# Patient Record
Sex: Female | Born: 1960 | Race: White | Hispanic: No | Marital: Single | Smoking: Never smoker
Health system: Southern US, Community
[De-identification: ages and names within clinical notes are randomized; demographics above are authoritative.]

## PROBLEM LIST (undated history)

## (undated) DIAGNOSIS — D649 Anemia, unspecified: Secondary | ICD-10-CM

## (undated) DIAGNOSIS — T7840XA Allergy, unspecified, initial encounter: Secondary | ICD-10-CM

## (undated) DIAGNOSIS — G43909 Migraine, unspecified, not intractable, without status migrainosus: Secondary | ICD-10-CM

## (undated) DIAGNOSIS — M1990 Unspecified osteoarthritis, unspecified site: Secondary | ICD-10-CM

## (undated) DIAGNOSIS — M25552 Pain in left hip: Secondary | ICD-10-CM

## (undated) DIAGNOSIS — K219 Gastro-esophageal reflux disease without esophagitis: Secondary | ICD-10-CM

## (undated) DIAGNOSIS — B029 Zoster without complications: Secondary | ICD-10-CM

## (undated) DIAGNOSIS — I4891 Unspecified atrial fibrillation: Secondary | ICD-10-CM

## (undated) DIAGNOSIS — T754XXA Electrocution, initial encounter: Secondary | ICD-10-CM

## (undated) DIAGNOSIS — F419 Anxiety disorder, unspecified: Secondary | ICD-10-CM

## (undated) HISTORY — DX: Pain in left hip: M25.552

## (undated) HISTORY — DX: Gastro-esophageal reflux disease without esophagitis: K21.9

## (undated) HISTORY — DX: Anxiety disorder, unspecified: F41.9

## (undated) HISTORY — DX: Unspecified osteoarthritis, unspecified site: M19.90

## (undated) HISTORY — DX: Allergy, unspecified, initial encounter: T78.40XA

## (undated) HISTORY — DX: Electrocution, initial encounter: T75.4XXA

## (undated) HISTORY — DX: Anemia, unspecified: D64.9

---

## 1993-10-25 HISTORY — PX: OVARIAN CYST REMOVAL: SHX89

## 1998-01-23 ENCOUNTER — Other Ambulatory Visit: Admission: RE | Admit: 1998-01-23 | Discharge: 1998-01-23 | Payer: Self-pay | Admitting: Obstetrics and Gynecology

## 1999-02-20 ENCOUNTER — Other Ambulatory Visit: Admission: RE | Admit: 1999-02-20 | Discharge: 1999-02-20 | Payer: Self-pay | Admitting: Obstetrics and Gynecology

## 1999-03-09 ENCOUNTER — Ambulatory Visit (HOSPITAL_COMMUNITY): Admission: RE | Admit: 1999-03-09 | Discharge: 1999-03-09 | Payer: Self-pay | Admitting: Obstetrics and Gynecology

## 1999-05-13 ENCOUNTER — Ambulatory Visit (HOSPITAL_COMMUNITY): Admission: RE | Admit: 1999-05-13 | Discharge: 1999-05-13 | Payer: Self-pay | Admitting: Obstetrics and Gynecology

## 1999-05-13 ENCOUNTER — Encounter: Payer: Self-pay | Admitting: Obstetrics and Gynecology

## 2000-02-22 ENCOUNTER — Other Ambulatory Visit: Admission: RE | Admit: 2000-02-22 | Discharge: 2000-02-22 | Payer: Self-pay | Admitting: Obstetrics and Gynecology

## 2001-04-05 ENCOUNTER — Other Ambulatory Visit: Admission: RE | Admit: 2001-04-05 | Discharge: 2001-04-05 | Payer: Self-pay | Admitting: Obstetrics and Gynecology

## 2002-04-06 ENCOUNTER — Other Ambulatory Visit: Admission: RE | Admit: 2002-04-06 | Discharge: 2002-04-06 | Payer: Self-pay | Admitting: Obstetrics and Gynecology

## 2002-06-01 ENCOUNTER — Encounter: Payer: Self-pay | Admitting: Obstetrics and Gynecology

## 2002-06-01 ENCOUNTER — Ambulatory Visit (HOSPITAL_COMMUNITY): Admission: RE | Admit: 2002-06-01 | Discharge: 2002-06-01 | Payer: Self-pay

## 2003-04-25 ENCOUNTER — Other Ambulatory Visit: Admission: RE | Admit: 2003-04-25 | Discharge: 2003-04-25 | Payer: Self-pay | Admitting: Obstetrics and Gynecology

## 2003-10-01 ENCOUNTER — Encounter: Payer: Self-pay | Admitting: Internal Medicine

## 2004-05-18 ENCOUNTER — Other Ambulatory Visit: Admission: RE | Admit: 2004-05-18 | Discharge: 2004-05-18 | Payer: Self-pay | Admitting: Obstetrics and Gynecology

## 2004-05-21 ENCOUNTER — Encounter: Admission: RE | Admit: 2004-05-21 | Discharge: 2004-05-21 | Payer: Self-pay | Admitting: Internal Medicine

## 2005-06-28 ENCOUNTER — Observation Stay (HOSPITAL_COMMUNITY): Admission: EM | Admit: 2005-06-28 | Discharge: 2005-06-29 | Payer: Self-pay

## 2005-07-13 ENCOUNTER — Ambulatory Visit: Payer: Self-pay | Admitting: Internal Medicine

## 2005-07-23 ENCOUNTER — Ambulatory Visit: Payer: Self-pay

## 2005-07-23 ENCOUNTER — Encounter: Payer: Self-pay | Admitting: Internal Medicine

## 2006-03-02 ENCOUNTER — Ambulatory Visit: Payer: Self-pay | Admitting: Cardiology

## 2006-03-02 ENCOUNTER — Ambulatory Visit: Payer: Self-pay | Admitting: Internal Medicine

## 2006-03-04 ENCOUNTER — Encounter: Admission: RE | Admit: 2006-03-04 | Discharge: 2006-03-04 | Payer: Self-pay | Admitting: Cardiology

## 2006-03-07 ENCOUNTER — Ambulatory Visit: Payer: Self-pay | Admitting: Internal Medicine

## 2006-03-24 ENCOUNTER — Ambulatory Visit: Payer: Self-pay | Admitting: Internal Medicine

## 2006-04-22 ENCOUNTER — Ambulatory Visit: Payer: Self-pay | Admitting: Internal Medicine

## 2006-05-04 ENCOUNTER — Ambulatory Visit: Payer: Self-pay | Admitting: Internal Medicine

## 2006-05-13 ENCOUNTER — Ambulatory Visit (HOSPITAL_COMMUNITY): Admission: RE | Admit: 2006-05-13 | Discharge: 2006-05-13 | Payer: Self-pay | Admitting: Obstetrics and Gynecology

## 2006-11-04 ENCOUNTER — Ambulatory Visit: Payer: Self-pay | Admitting: Internal Medicine

## 2006-11-04 LAB — CONVERTED CEMR LAB
Basophils Absolute: 0.2 10*3/uL — ABNORMAL HIGH (ref 0.0–0.1)
Basophils Relative: 2.9 % — ABNORMAL HIGH (ref 0.0–1.0)
Eosinophil percent: 2.7 % (ref 0.0–5.0)
Ferritin: 14.3 ng/mL (ref 10.0–291.0)
HCT: 39.7 % (ref 36.0–46.0)
Hemoglobin: 13 g/dL (ref 12.0–15.0)
Iron: 129 ug/dL (ref 42–145)
Lymphocytes Relative: 16.1 % (ref 12.0–46.0)
MCHC: 32.8 g/dL (ref 30.0–36.0)
MCV: 94.5 fL (ref 78.0–100.0)
Monocytes Absolute: 0.4 10*3/uL (ref 0.2–0.7)
Monocytes Relative: 6.3 % (ref 3.0–11.0)
Neutro Abs: 5.1 10*3/uL (ref 1.4–7.7)
Neutrophils Relative %: 72 % (ref 43.0–77.0)
Platelets: 287 10*3/uL (ref 150–400)
RBC: 4.2 M/uL (ref 3.87–5.11)
RDW: 12.7 % (ref 11.5–14.6)
Saturation Ratios: 30.8 % (ref 20.0–50.0)
Transferrin: 299.2 mg/dL (ref >=212.0)
WBC: 7 10*3/uL (ref 4.5–10.5)

## 2007-01-06 ENCOUNTER — Ambulatory Visit: Payer: Self-pay | Admitting: Internal Medicine

## 2007-10-04 ENCOUNTER — Ambulatory Visit: Payer: Self-pay | Admitting: Internal Medicine

## 2007-10-05 ENCOUNTER — Telehealth: Payer: Self-pay | Admitting: Internal Medicine

## 2008-01-02 ENCOUNTER — Ambulatory Visit (HOSPITAL_COMMUNITY): Admission: RE | Admit: 2008-01-02 | Discharge: 2008-01-02 | Payer: Self-pay | Admitting: Obstetrics and Gynecology

## 2008-01-31 ENCOUNTER — Telehealth: Payer: Self-pay | Admitting: Internal Medicine

## 2008-02-19 ENCOUNTER — Ambulatory Visit: Payer: Self-pay | Admitting: Internal Medicine

## 2008-02-19 ENCOUNTER — Encounter (INDEPENDENT_AMBULATORY_CARE_PROVIDER_SITE_OTHER): Payer: Self-pay | Admitting: Family Medicine

## 2008-02-19 ENCOUNTER — Ambulatory Visit: Payer: Self-pay | Admitting: *Deleted

## 2008-02-19 LAB — CONVERTED CEMR LAB
ALT: 16 units/L (ref 0–35)
Basophils Absolute: 0 10*3/uL (ref 0.0–0.1)
Chloride: 103 meq/L (ref 96–112)
Cholesterol: 160 mg/dL (ref 0–200)
Eosinophils Absolute: 0.4 10*3/uL (ref 0.0–0.7)
Eosinophils Relative: 4 % (ref 0–5)
Glucose, Bld: 86 mg/dL (ref 70–99)
HCT: 38.8 % (ref 36.0–46.0)
HDL: 63 mg/dL (ref >39)
Hemoglobin: 12.8 g/dL (ref 12.0–15.0)
LDL Cholesterol: 74 mg/dL (ref 0–99)
MCHC: 33 g/dL (ref 30.0–36.0)
MCV: 93.5 fL (ref 78.0–100.0)
Neutro Abs: 6.3 10*3/uL (ref 1.7–7.7)
Neutrophils Relative %: 69 % (ref 43–77)
Potassium: 4.9 meq/L (ref 3.5–5.3)
RBC: 4.15 M/uL (ref 3.87–5.11)
Sodium: 138 meq/L (ref 135–145)
Total CHOL/HDL Ratio: 2.5
Total Protein: 7.2 g/dL (ref 6.0–8.3)
Triglycerides: 114 mg/dL (ref <150)
VLDL: 23 mg/dL (ref 0–40)

## 2008-04-12 ENCOUNTER — Encounter: Payer: Self-pay | Admitting: Family Medicine

## 2008-04-12 ENCOUNTER — Ambulatory Visit: Payer: Self-pay | Admitting: Internal Medicine

## 2008-04-12 LAB — CONVERTED CEMR LAB
Chlamydia, DNA Probe: NEGATIVE
Hep B Core Total Ab: NEGATIVE
Hep B E Ab: NEGATIVE

## 2008-04-25 ENCOUNTER — Ambulatory Visit: Payer: Self-pay | Admitting: Internal Medicine

## 2008-11-12 ENCOUNTER — Ambulatory Visit: Payer: Self-pay | Admitting: Internal Medicine

## 2009-02-04 ENCOUNTER — Telehealth: Payer: Self-pay | Admitting: Internal Medicine

## 2009-08-01 ENCOUNTER — Ambulatory Visit: Payer: Self-pay | Admitting: Family Medicine

## 2009-08-07 ENCOUNTER — Ambulatory Visit (HOSPITAL_COMMUNITY): Admission: RE | Admit: 2009-08-07 | Discharge: 2009-08-07 | Payer: Self-pay | Admitting: Family Medicine

## 2009-10-23 ENCOUNTER — Ambulatory Visit: Payer: Self-pay | Admitting: Family Medicine

## 2009-12-16 ENCOUNTER — Telehealth: Payer: Self-pay | Admitting: Internal Medicine

## 2010-02-02 ENCOUNTER — Ambulatory Visit: Payer: Self-pay | Admitting: Internal Medicine

## 2010-02-02 DIAGNOSIS — F411 Generalized anxiety disorder: Secondary | ICD-10-CM | POA: Insufficient documentation

## 2010-04-23 ENCOUNTER — Encounter (INDEPENDENT_AMBULATORY_CARE_PROVIDER_SITE_OTHER): Payer: Self-pay | Admitting: Family Medicine

## 2010-04-23 ENCOUNTER — Ambulatory Visit: Payer: Self-pay | Admitting: Internal Medicine

## 2010-04-23 LAB — CONVERTED CEMR LAB
HDL: 75 mg/dL (ref >39)
Total CHOL/HDL Ratio: 2.2
Triglycerides: 111 mg/dL (ref <150)
Vit D, 25-Hydroxy: 57 ng/mL (ref 30–89)

## 2010-05-01 ENCOUNTER — Ambulatory Visit: Payer: Self-pay | Admitting: Internal Medicine

## 2010-05-01 ENCOUNTER — Telehealth: Payer: Self-pay | Admitting: Internal Medicine

## 2010-05-01 LAB — CONVERTED CEMR LAB
ALT: 21 units/L (ref 0–35)
AST: 21 units/L (ref 0–37)
Albumin: 3.6 g/dL (ref 3.5–5.2)
Alkaline Phosphatase: 36 units/L — ABNORMAL LOW (ref 39–117)
Basophils Absolute: 0 10*3/uL (ref 0.0–0.1)
Bilirubin, Direct: 0.1 mg/dL (ref 0.0–0.3)
Calcium: 9.1 mg/dL (ref 8.4–10.5)
Chloride: 110 meq/L (ref 96–112)
Cholesterol: 171 mg/dL (ref 0–200)
Eosinophils Absolute: 0.4 10*3/uL (ref 0.0–0.7)
Eosinophils Relative: 5.3 % — ABNORMAL HIGH (ref 0.0–5.0)
GFR calc non Af Amer: 87.59 mL/min (ref >60)
Hemoglobin: 12.1 g/dL (ref 12.0–15.0)
LDL Cholesterol: 88 mg/dL (ref 0–99)
Lymphocytes Relative: 17.5 % (ref 12.0–46.0)
Lymphs Abs: 1.4 10*3/uL (ref 0.7–4.0)
MCV: 93.5 fL (ref 78.0–100.0)
Monocytes Relative: 6.2 % (ref 3.0–12.0)
Neutro Abs: 5.7 10*3/uL (ref 1.4–7.7)
Neutrophils Relative %: 70.6 % (ref 43.0–77.0)
Nitrite: NEGATIVE
Platelets: 269 10*3/uL (ref 150.0–400.0)
Protein, U semiquant: NEGATIVE
Sodium: 143 meq/L (ref 135–145)
Total Bilirubin: 0.5 mg/dL (ref 0.3–1.2)
Total Protein: 7 g/dL (ref 6.0–8.3)
WBC: 8.1 10*3/uL (ref 4.5–10.5)
pH: 5

## 2010-05-14 ENCOUNTER — Ambulatory Visit: Payer: Self-pay | Admitting: Internal Medicine

## 2010-07-09 ENCOUNTER — Ambulatory Visit: Payer: Self-pay | Admitting: Internal Medicine

## 2010-07-13 ENCOUNTER — Telehealth: Payer: Self-pay | Admitting: Internal Medicine

## 2010-07-15 ENCOUNTER — Telehealth: Payer: Self-pay | Admitting: Internal Medicine

## 2010-07-23 ENCOUNTER — Ambulatory Visit: Payer: Self-pay | Admitting: Internal Medicine

## 2010-07-23 LAB — CONVERTED CEMR LAB
BUN: 11 mg/dL (ref 6–23)
Basophils Absolute: 0 10*3/uL (ref 0.0–0.1)
Basophils Relative: 0.2 % (ref 0.0–3.0)
Chloride: 106 meq/L (ref 96–112)
Eosinophils Absolute: 0.2 10*3/uL (ref 0.0–0.7)
GFR calc non Af Amer: 80.07 mL/min (ref >60)
Glucose, Bld: 79 mg/dL (ref 70–99)
Hemoglobin: 12.7 g/dL (ref 12.0–15.0)
Neutro Abs: 7.6 10*3/uL (ref 1.4–7.7)
Potassium: 5.1 meq/L (ref 3.5–5.1)
Sodium: 139 meq/L (ref 135–145)
WBC: 9.7 10*3/uL (ref 4.5–10.5)

## 2010-07-24 ENCOUNTER — Telehealth: Payer: Self-pay | Admitting: Internal Medicine

## 2010-07-24 ENCOUNTER — Encounter: Payer: Self-pay | Admitting: Internal Medicine

## 2010-07-27 ENCOUNTER — Telehealth: Payer: Self-pay | Admitting: Internal Medicine

## 2010-08-10 ENCOUNTER — Telehealth (INDEPENDENT_AMBULATORY_CARE_PROVIDER_SITE_OTHER): Payer: Self-pay | Admitting: *Deleted

## 2010-08-10 ENCOUNTER — Ambulatory Visit: Payer: Self-pay | Admitting: Family Medicine

## 2010-08-10 ENCOUNTER — Telehealth: Payer: Self-pay | Admitting: Internal Medicine

## 2010-08-10 DIAGNOSIS — K219 Gastro-esophageal reflux disease without esophagitis: Secondary | ICD-10-CM | POA: Insufficient documentation

## 2010-08-10 LAB — CONVERTED CEMR LAB
Beta hcg, urine, semiquantitative: NEGATIVE
Bilirubin Urine: NEGATIVE
Glucose, Urine, Semiquant: NEGATIVE
Nitrite: NEGATIVE
WBC Urine, dipstick: NEGATIVE
pH: 5.5

## 2010-08-11 LAB — CONVERTED CEMR LAB
Basophils Relative: 0.7 % (ref 0.0–3.0)
Eosinophils Absolute: 0.2 10*3/uL (ref 0.0–0.7)
HCT: 39.2 % (ref 36.0–46.0)
Hemoglobin: 13.2 g/dL (ref 12.0–15.0)
Lymphocytes Relative: 20.2 % (ref 12.0–46.0)
MCHC: 33.6 g/dL (ref 30.0–36.0)
MCV: 93.3 fL (ref 78.0–100.0)
Monocytes Relative: 6.8 % (ref 3.0–12.0)
Neutro Abs: 5.5 10*3/uL (ref 1.4–7.7)
Neutrophils Relative %: 69.8 % (ref 43.0–77.0)
Platelets: 280 10*3/uL (ref 150.0–400.0)
RBC: 4.21 M/uL (ref 3.87–5.11)
RDW: 12.7 % (ref 11.5–14.6)
WBC: 7.9 10*3/uL (ref 4.5–10.5)

## 2010-08-13 ENCOUNTER — Telehealth: Payer: Self-pay | Admitting: Family Medicine

## 2010-08-13 ENCOUNTER — Telehealth: Payer: Self-pay | Admitting: Internal Medicine

## 2010-08-18 ENCOUNTER — Ambulatory Visit: Payer: Self-pay | Admitting: Internal Medicine

## 2010-08-18 DIAGNOSIS — M549 Dorsalgia, unspecified: Secondary | ICD-10-CM | POA: Insufficient documentation

## 2010-08-19 ENCOUNTER — Telehealth: Payer: Self-pay | Admitting: Internal Medicine

## 2010-08-31 ENCOUNTER — Telehealth: Payer: Self-pay | Admitting: Internal Medicine

## 2010-08-31 DIAGNOSIS — R109 Unspecified abdominal pain: Secondary | ICD-10-CM | POA: Insufficient documentation

## 2010-09-02 ENCOUNTER — Ambulatory Visit (HOSPITAL_COMMUNITY)
Admission: RE | Admit: 2010-09-02 | Discharge: 2010-09-02 | Payer: Self-pay | Source: Home / Self Care | Admitting: Internal Medicine

## 2010-09-03 ENCOUNTER — Telehealth: Payer: Self-pay | Admitting: Internal Medicine

## 2010-09-03 ENCOUNTER — Encounter: Payer: Self-pay | Admitting: Internal Medicine

## 2010-09-03 ENCOUNTER — Ambulatory Visit (HOSPITAL_COMMUNITY)
Admission: RE | Admit: 2010-09-03 | Discharge: 2010-09-03 | Payer: Self-pay | Source: Home / Self Care | Admitting: Obstetrics and Gynecology

## 2010-09-04 ENCOUNTER — Telehealth: Payer: Self-pay | Admitting: Internal Medicine

## 2010-09-07 ENCOUNTER — Encounter: Payer: Self-pay | Admitting: Internal Medicine

## 2010-09-07 ENCOUNTER — Telehealth: Payer: Self-pay | Admitting: Internal Medicine

## 2010-09-08 ENCOUNTER — Ambulatory Visit: Payer: Self-pay | Admitting: Gastroenterology

## 2010-09-08 DIAGNOSIS — N83209 Unspecified ovarian cyst, unspecified side: Secondary | ICD-10-CM | POA: Insufficient documentation

## 2010-09-11 ENCOUNTER — Telehealth: Payer: Self-pay | Admitting: Internal Medicine

## 2010-10-13 ENCOUNTER — Ambulatory Visit: Payer: Self-pay | Admitting: Gastroenterology

## 2010-10-25 DIAGNOSIS — M25552 Pain in left hip: Secondary | ICD-10-CM

## 2010-10-25 HISTORY — DX: Pain in left hip: M25.552

## 2010-11-13 ENCOUNTER — Ambulatory Visit (HOSPITAL_COMMUNITY)
Admission: RE | Admit: 2010-11-13 | Discharge: 2010-11-13 | Payer: Self-pay | Source: Home / Self Care | Attending: Obstetrics and Gynecology | Admitting: Obstetrics and Gynecology

## 2010-11-26 NOTE — Progress Notes (Signed)
Summary: Appt sooner than 1st available  Phone Note From Other Clinic   Caller: Aurther Loft @ Dr Cato Mulligan [phone] Call For: Dr Marina Goodell Reason for Call: Schedule Patient Appt Summary of Call: New patient scheduled for first available 10-20-10. Abd Pain. Dr Cato Mulligan wonders if she can be seen sooner - PA would be ok. Initial call taken by: Leanor Kail [facility],  September 07, 2010 10:30 AM  Follow-up for Phone Call        [place] ntfd. that pt. given appt. for tomorrow with N.P. and Dr.Jacobs will become her Dr. She will confirm appt. with pt. and call back.  Teryl Lucy RN  September 07, 2010 11:03 AM Pt. kept appt. . Follow-up by: Teryl Lucy RN,  September 08, 2010 11:03 AM

## 2010-11-26 NOTE — Progress Notes (Signed)
Summary: Call A Nurse  Phone Note Call from Patient   Summary of Call: Pt given Dr Rise Patience recommendations of normal ur culture and she will finish her Cipro and Flagyl.  Will call if no better. Initial call taken by: Lynann Beaver CMA,  August 13, 2010 11:10 AM  Follow-up for Phone Call       Follow-up by: Lynann Beaver CMA,  August 13, 2010 11:09 AM     Call-A-Nurse Triage Call Report Triage Record Num: [id number] Operator: Remonia Richter Patient Name: Corina Stacy Call Date & Time: 08/12/2010 9:51:11PM Patient Phone: [phone] PCP: Valetta Mole. [facility] Patient Gender: Female PCP Fax : [phone] Patient DOB: Aug 27, 1961 Practice Name: [facility] Reason for Call: put on Cipro and Flagyl for possible e-Coli due to fever and diarrhea with pain in stomach, stools negative, has had s/s x 2 weeks,,now asking if urine c/s done 2 days ago has come back with results, has heartburn tonight and wondering if it can be caused by antibiotics, afebrile, all emergent s/s ruled out,home care advise given per abd pain guideline,callback perimeters gone over, will call in am for c/s results and further advise Protocol(s) Used: Abdominal Pain Recommended Outcome per Protocol: See Provider within 24 hours Reason for Outcome: Abdominal discomfort (feeling of fullness/bloating, nausea) AND has started new prescription, nonprescription or alternative medication OR change in dose or frequency of usual medication Care Advice:  ~ Eliminate or decrease the intake of alcoholic or caffeinated beverages. Do not take aspirin, ibuprofen, ketoprofen, naproxen, etc., or other pain relieving medications until consulting with provider.  ~ Call provider during regular office hours to inform of symptoms and receive instructions regarding continuing or changing medication or treatment plan.  ~  ~ SYMPTOM / CONDITION MANAGEMENT  ~ CAUTIONS 10/

## 2010-11-26 NOTE — Progress Notes (Signed)
Summary: stool samples results  Phone Note Call from Patient Call back at Home Phone [phone]   Caller: Patient Call For: Birdie Sons MD Summary of Call: pt would like other stool samples results.Pt is on flagyl feeling better. [facility] Initial call taken by: Heron Sabins,  July 27, 2010 4:05 PM  Follow-up for Phone Call        Pt informed on personally identified VM of culture results.  Fever broke over the weekend, stools returning to normal slowly. FYI  Follow-up by: Sid Falcon LPN,  July 27, 2010 4:24 PM

## 2010-11-26 NOTE — Progress Notes (Signed)
Summary: alprazolam denied  Phone Note From Pharmacy Call back at fax [phone]   Caller: [facility] via fax Call For: swords  Summary of Call: refill alprazolam 0.25mg  Take 1 tablet by mouth once a day as needed  Initial call taken by: Gladis Riffle, RN,  December 16, 2009 12:29 PM  Follow-up for Phone Call        denied as was last seen 10/04/07--needs office visit to refill--pharmacy notified via fax Follow-up by: Gladis Riffle, RN,  December 16, 2009 12:29 PM

## 2010-11-26 NOTE — Assessment & Plan Note (Signed)
Summary: flu shot//ccm  Nurse Visit   Allergies: 1)  Sulfamethoxazole (Sulfamethoxazole)  Review of Systems       Flu Vaccine Consent Questions     Do you have a history of severe allergic reactions to this vaccine? no    Any prior history of allergic reactions to egg and/or gelatin? no    Do you have a sensitivity to the preservative Thimersol? no    Do you have a past history of Guillan-Barre Syndrome? no    Do you currently have an acute febrile illness? no    Have you ever had a severe reaction to latex? no    Vaccine information given and explained to patient? yes    Are you currently pregnant? no    Lot Number:[id number]   Exp Date:04/24/2011   Site Given  Left Deltoid IM Josph Macho RMA  July 09, 2010 1:16 PM    Orders Added: 1)  Admin 1st Vaccine [90471] 2)  Flu Vaccine 40yrs + [56213]

## 2010-11-26 NOTE — Progress Notes (Signed)
  Phone Note Call from Patient   Caller: Patient Call For: Birdie Sons MD Summary of Call: GYN does not need to see her but ordered CA 125.  He feels endometriosis could be the reason for the pain.  Wants to know if she can take Ibuprofen now? [phone] Initial call taken by: [facility] CMA AAMA,  September 03, 2010 4:02 PM  Follow-up for Phone Call        yes (otc 200 mg, 2 by mouth two times a day as needed ) Follow-up by: Birdie Sons MD,  September 03, 2010 4:20 PM  Additional Follow-up for Phone Call Additional follow up Details #1::        Discussed with pt. Additional Follow-up by: Lynann Beaver CMA AAMA,  September 03, 2010 5:01 PM

## 2010-11-26 NOTE — Progress Notes (Signed)
Summary: please advise of labs and rx  Phone Note Call from Patient Call back at Home Phone [phone]   Caller: Patient--voice mail Summary of Call: was seen today and lab told her that her urine specimen eas negative. Does she need to take rx for this? She did infact start the regimen. Please advise. Initial call taken by: Warnell Forester,  August 10, 2010 1:52 PM  Follow-up for Phone Call        see the note I sent, but essentially I am going to culture the urine just for some slight findings and I do want her to take the Cipro and I added Flagyl to cover Diverticulitis like I told her I would Follow-up by: Danise Edge MD,  August 10, 2010 2:23 PM  Additional Follow-up for Phone Call Additional follow up Details #1::        Informed pt Additional Follow-up by: Josph Macho RMA,  August 10, 2010 3:03 PM

## 2010-11-26 NOTE — Assessment & Plan Note (Signed)
Summary: WATERY DIARRHEA 3 DAYS/HEARD E COLI REPORT/PS   Vital Signs:  Patient profile:   50 year old female Menstrual status:  regular Weight:      166 pounds Temp:     98.8 degrees F oral Pulse rate:   82 / minute BP sitting:   106 / 60  Vitals Entered By: Lynann Beaver CMA (July 23, 2010 9:31 AM) CC: diarrhea x 4 days Is Patient Diabetic? No Pain Assessment Patient in pain? no        CC:  diarrhea x 4 days.  History of Present Illness: diarrhea she is concerned that she has eaten the beef that has call "ecoli" she feels well today but has had bad diarrhea x 3 days  boyfriend ate same meat--not ill.  she denies any fevers or chills. Minimal abdominal pain. No blood in stool.  She has spoken with the store that sold her a hamburger. Apparently the hamburger that she has eaten was not be be that actually was painted with Escherichia coli.  review of systems she endorses fatigue, some muscle aches, minimal sweating. No other complaintsin a complete review of systems.  Current Medications (verified): 1)  Alprazolam 0.25 Mg Tabs (Alprazolam) .... Take 1 Tablet By Mouth Once A Day As Needed 2)  Triamcinolone Acetonide 0.5 % Crea (Triamcinolone Acetonide) .... As Directed As Needed 3)  Flonase 50 Mcg/act Susp (Fluticasone Propionate) .... One To Two Sprays Daily 4)  Benadryl 25 Mg Tabs (Diphenhydramine Hcl) .... As Needed Allergy 5)  Nuvaring 0.12-0.015 Mg/24hr Ring (Etonogestrel-Ethinyl Estradiol) .... Insert and Leave in For 3 Weeks, Remove For 1 Week and Repeat  Allergies (verified): 1)  Sulfamethoxazole (Sulfamethoxazole)  Past History:  Past Medical History: Last updated: 01/23/2008 ovarian cysts Anemia-NOS--secondary to menses  Past Surgical History: Last updated: 01/23/2008 laparoscopy for ovarian cysts  Family History: Last updated: 05/14/2010 Father: alive in 03-27-03 with CAD, AAA, deceased lung CA (complications of therapy) Mother: deceased MI age  74 Siblings:brothers 2 half healthy---one half sister healthy Family History of Aneurysm Aortic Family History of CAD Female 1st degree relative <60 Family History of CAD Female 1st degree relative <50  Social History: Last updated: 02/02/2010 Marital Status: single  Occupation: unemployed Regular exercise-no Never Smoked  Risk Factors: Exercise: no (01/23/2008)  Risk Factors: Smoking Status: never (05/14/2010)  Physical Exam  General:  well-developed well-nourished female in no acute distress. HEENT exam atraumatic, normocephalic symmetric her muscles are intact. No icterus noted. Neck is supple without lymphadenopathy or thyromegaly. Chest clear to auscultation without increased work of breathing. Cardiac exam S1-S2 are regular. Abdominal exam active bowel sounds, soft, nontender even to deep palpation. Extremities have no clubbing cyanosis or edema. No jaundice is identified on dermatologic exam.   Impression & Recommendations:  Problem # 1:  DIARRHEA (ICD-787.91) unclear etiology. I suspect acute viral gastroenteritis. Given history she needs further evaluation. We'll check laboratories. We'll check stool cultures. Patient understands workup. She will call me if systems worsen. Orders: Venipuncture (84166) T-Culture, Stool ([id number]) T-Stool for O&P ([id number]) T-Culture, C-Diff Toxin A/B ([id number]) Specimen Handling (25427) TLB-BMP (Basic Metabolic Panel-BMET) (80048-METABOL) TLB-Hepatic/Liver Function Pnl (80076-HEPATIC) TLB-CBC Platelet - w/Differential (85025-CBCD)  Complete Medication List: 1)  Alprazolam 0.25 Mg Tabs (Alprazolam) .... Take 1 tablet by mouth once a day as needed 2)  Triamcinolone Acetonide 0.5 % Crea (Triamcinolone acetonide) .... As directed as needed 3)  Flonase 50 Mcg/act Susp (Fluticasone propionate) .... One to two sprays daily 4)  Benadryl 25 Mg Tabs (Diphenhydramine  hcl) .... As needed allergy 5)  Nuvaring 0.12-0.015 Mg/24hr Ring  (Etonogestrel-ethinyl estradiol) .... Insert and leave in for 3 weeks, remove for 1 week and repeat 6)  Flagyl 500 Mg Tabs (Metronidazole) .... One by mouth three times a day x 7 days.  no alcohol

## 2010-11-26 NOTE — Assessment & Plan Note (Signed)
Summary: ABD PAIN/YF   History of Present Illness Visit Type: Initial Consult Primary GI MD: Rob Bunting MD Primary Kaylinn Dedic: Birdie Sons, MD  Requesting Printice Hellmer: Birdie Sons, MD  Chief Complaint: Severe abd pain, GERD, belching, bloating, constipation, and diarrhea  History of Present Illness:   Patient is a 50 year old female here for initial evaluation of recently developed gastrointestinal problems.  One and a half months ago, after eating chili, patient developed severe mid lower abdominal cramps, diarrhea with "pus", and low grade fevers. She also had an exacerbation of GERD symptoms. Thought she was ill from eating hamburger meat that was possibly infected with E.Coli. Went to see PCP, CBC, CMET okay. Stool c&s and one C-Diff negative. Fevers persisted for 10 days but her diarrhea improved. GERD symptoms improved with Zantac. Her abdominal pain has improved but she continues to have mild lower discomfort. In addition, she has developed lower back discomfort with radiation into bilateral hips. Patient has chronic back pain but this is different.  During this recent period of illness patient passed a couple of black stools. She took Pepto Bismul at some point but doesn't seem to correlate timewise to passage of black stool  Yesterday patient ate regular food and felt okay. She feels okay today  except for mild mid lower abdominal and lower back discomfort. Normal BM this am. Started Probiotic a week ago.  Patient has been under a significant amount of stress with family situations. She doesn't normally have any gastrointestinal problems except for intermittent GERD for which she takes Zantac as needed. She has hemorrhoids which bleed infrequently.        GI Review of Systems    Reports abdominal pain, acid reflux, belching, bloating, and  heartburn.     Location of  Abdominal pain: generalized.    Denies chest pain, dysphagia with liquids, dysphagia with solids, loss of appetite,  nausea, vomiting, vomiting blood, weight loss, and  weight gain.      Reports diarrhea and  diverticulosis.     Denies anal fissure, black tarry stools, change in bowel habit, constipation, fecal incontinence, heme positive stool, hemorrhoids, irritable bowel syndrome, jaundice, light color stool, liver problems, rectal bleeding, and  rectal pain.    Current Medications (verified): 1)  Alprazolam 0.25 Mg Tabs (Alprazolam) .... Take 1 Tablet By Mouth Once A Day As Needed 2)  Triamcinolone Acetonide 0.5 % Crea (Triamcinolone Acetonide) .... As Directed As Needed 3)  Flonase 50 Mcg/act Susp (Fluticasone Propionate) .... One To Two Sprays Daily 4)  Benadryl 25 Mg Tabs (Diphenhydramine Hcl) .... As Needed Allergy 5)  Nuvaring 0.12-0.015 Mg/24hr Ring (Etonogestrel-Ethinyl Estradiol) .... Insert and Leave in For 3 Weeks, Remove For 1 Week and Repeat 6)  Ranitidine Hcl 300 Mg Tabs (Ranitidine Hcl) .Marland Kitchen.. 1 Tab By Mouth Daily  Allergies (verified): 1)  Sulfamethoxazole (Sulfamethoxazole)  Past History:  Past Medical History: Ovarian cysts Anemia-NOS--secondary to menses Anxiety Disorder Depression GERD Urinary Tract Infection  Past Surgical History: laparoscopy for ovarian cysts--12 yrs ago   Family History: Father: alive in 03-09-2003 with CAD, AAA, deceased lung CA (complications of therapy) Mother: deceased MI age 26 Siblings:brothers 2 half healthy---one half sister healthy Family History of Aneurysm Aortic Family History of CAD Female 1st degree relative <60 Family History of CAD Female 1st degree relative <50 Family History of Colon Cancer:First Cousin on Paternal Side Family History of Colon Polyps:MGM  Social History: Marital Status: single  Occupation: unemployed Regular exercise-no Never Smoked Daily Caffeine Use:  one daily Illicit Drug Use - no Drug Use:  no  Review of Systems       The patient complains of anxiety-new, arthritis/joint pain, back pain, breast changes/lumps,  depression-new, muscle pains/cramps, and urine leakage.  The patient denies allergy/sinus, anemia, blood in urine, change in vision, confusion, cough, coughing up blood, fainting, fatigue, fever, headaches-new, hearing problems, heart murmur, heart rhythm changes, itching, menstrual pain, night sweats, nosebleeds, pregnancy symptoms, shortness of breath, skin rash, sleeping problems, sore throat, swelling of feet/legs, swollen lymph glands, thirst - excessive , urination - excessive , urination changes/pain, vision changes, and voice change.    Vital Signs:  Patient profile:   50 year old female Menstrual status:  regular Height:      68 inches Weight:      164 pounds BMI:     25.03 BSA:     1.88 Pulse rate:   76 / minute Pulse rhythm:   regular BP sitting:   124 / 68  (left arm) Cuff size:   regular  Vitals Entered By: Ok Anis CMA (September 08, 2010 8:46 AM)  Physical Exam  General:  Well developed, well nourished, no acute distress. Head:  Normocephalic and atraumatic. Eyes:   Conjunctiva pink, no icterus.  Neck:  no obvious masses  Lungs:  Clear throughout to auscultation. Heart:  Regular rate and rhythm; no murmurs, rubs,  or bruits. Abdomen:  Abdomen soft, nontender, nondistended. No obvious masses or hepatomegaly.Normal bowel sounds.  Rectal:  No external or internal lesions appreciated. Brown, heme negative stool Msk:  Symmetrical with no gross deformities. Normal posture. Extremities:  No palmar erythema, no edema.  Neurologic:  Alert and  oriented x4;  grossly normal neurologically. Skin:  Intact without significant lesions or rashes. Cervical Nodes:  No significant cervical adenopathy. Psych:  Alert and cooperative. Normal mood and affect.   Impression & Recommendations:  Problem # 1:  ABDOMINAL PAIN OTHER SPECIFIED SITE (ICD-789.09) Assessment Improved Residual mid lower abdominal discomfort from recent infectious type process consisting of abdominal  cramps,  diarrhea and low grade fevers. The discomfort is tolerable and will hopefully resolve as stools continue to normalize. 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 She doesn't feel the need to try Hyoscyamine. Patient looks good, her abdominal exam is benign. Return to clinic in 4-6 weeks,  if bowel habits not back to baseline and having persistent discomfort, colonoscopy will be considered.   Problem # 2:  GERD (ICD-530.81) Assessment: Comment Only Chronic intermittent GERD symptoms with recent exacerbation at onset of acute GI symptoms. Now asymptomatic on Zantac.   Problem # 3:  OTHER AND UNSPECIFIED OVARIAN CYST (ICD-620.2) Assessment: Comment Only 2.6 cm right ovary cyst seen on recent transvaginal U/S. To see GYN.  Patient Instructions: 1)  We made you an office visit with Dr. Rob Bunting for 10-13-2010 at 10:15 AM.   2)  If symptoms returnr call us and we will get you in sooner. 3)  Copy sent to : Dr. Birdie Sons

## 2010-11-26 NOTE — Assessment & Plan Note (Signed)
Summary: med check/refill/cjr   Vital Signs:  Patient profile:   50 year old female Weight:      177 pounds Temp:     98.6 degrees F oral Pulse rate:   72 / minute Pulse rhythm:   regular Resp:     12 per minute BP sitting:   110 / 64  (left arm) Cuff size:   regular  Vitals Entered By: Gladis Riffle, RN (February 02, 2010 10:10 AM) CC: medreview, refills--c/o left shoulder and arm pain--began in neck and has worked way down to lower arm, taking ibuprofen Is Patient Diabetic? No   CC:  medreview, refills--c/o left shoulder and arm pain--began in neck and has worked way down to lower arm, and taking ibuprofen.  History of Present Illness: c/o shoulder pain--- she relates to mild neck pain that seems to radiate to Left shoulder --- can radiate to upper arm  anxiety - rare use of alprazolam (no ill side effects)  Preventive Screening-Counseling & Management  Alcohol-Tobacco     Smoking Status: never  Current Medications (verified): 1)  Alprazolam 0.25 Mg Tabs (Alprazolam) .... Take 1 Tablet By Mouth Once A Day As Needed 2)  Triamcinolone Acetonide 0.5 % Crea (Triamcinolone Acetonide) .... As Directed As Needed 3)  Flonase 50 Mcg/act Susp (Fluticasone Propionate) .... One To Two Sprays Daily 4)  Benadryl 25 Mg Tabs (Diphenhydramine Hcl) .... As Needed Allergy  Allergies: 1)  Sulfamethoxazole (Sulfamethoxazole)  Social History: Marital Status: single  Occupation: unemployed Regular exercise-no Never Smoked  Physical Exam  General:  alert and well-developed.   Msk:  FROM neck  and both shoulders Neurologic:  cranial nerves II-XII intact and DTRs symmetrical and normal.   strength normal both arms   Impression & Recommendations:  Problem # 1:  NECK PAIN (ICD-723.1) I suspect radicular pain ibuprofen as needed call if sxs worsen or for any weakness  Problem # 2:  ANXIETY STATE NOS (ICD-300.00) use minimally Her updated medication list for this problem includes:  Alprazolam 0.25 Mg Tabs (Alprazolam) .Marland Kitchen... Take 1 tablet by mouth once a day as needed  Complete Medication List: 1)  Alprazolam 0.25 Mg Tabs (Alprazolam) .... Take 1 tablet by mouth once a day as needed 2)  Triamcinolone Acetonide 0.5 % Crea (Triamcinolone acetonide) .... As directed as needed 3)  Flonase 50 Mcg/act Susp (Fluticasone propionate) .... One to two sprays daily 4)  Benadryl 25 Mg Tabs (Diphenhydramine hcl) .... As needed allergy Prescriptions: ALPRAZOLAM 0.25 MG TABS (ALPRAZOLAM) Take 1 tablet by mouth once a day as needed  #20 x 1   Entered and Authorized by:   Birdie Sons MD   Signed by:   Birdie Sons MD on 02/02/2010   Method used:   Print then Give to Patient   RxID:   [id number]

## 2010-11-26 NOTE — Progress Notes (Signed)
  Phone Note Call from Patient   Caller: Patient Call For: Birdie Sons MD Summary of Call: Pt would like to rule out an ovarian cyst as she is still having pain in abdomen and back.  She needs to go to a [facility] facility, please. [phone]  Has plans tomorrow and cannot schedule it tomorrow.  Today, Wed, or Thurs.  Initial call taken by: Lynann Beaver CMA AAMA,  August 31, 2010 10:17 AM  Follow-up for Phone Call        pelvic u/s  back and pelvic pain Follow-up by: Birdie Sons MD,  August 31, 2010 11:57 AM  New Problems: PELVIC PAIN, ACUTE (ICD-789.09)   New Problems: PELVIC PAIN, ACUTE (ICD-789.09)

## 2010-11-26 NOTE — Assessment & Plan Note (Signed)
   History of Present Illness Visit Type: Follow-up Visit Primary GI MD: Rob Bunting MD Primary Provider: Birdie Sons, MD  Requesting Provider: Birdie Sons, MD  Chief Complaint: abdominal pain History of Present Illness:     very pleasant 50 year old woman who was last seen here about one month ago.at that time it was felt that she had an infectious gastroenteritis that was already starting to resolve.  in the past month, she has post prandial discomfort about once a week now (this is "much, much, much" better than it was.  No particular foods cause it. She does think stress is playing a role.  BMs will often help her symptoms.    She has lost 20 pounds in 3 months, intentionally (excercising and dieting).  She saw a small amont of blood once, rectall. Bowels are back to normal.           Current Medications (verified): 1)  Alprazolam 0.25 Mg Tabs (Alprazolam) .... Take 1 Tablet By Mouth Once A Day As Needed 2)  Triamcinolone Acetonide 0.5 % Crea (Triamcinolone Acetonide) .... As Directed As Needed 3)  Flonase 50 Mcg/act Susp (Fluticasone Propionate) .... One To Two Sprays Daily 4)  Benadryl 25 Mg Tabs (Diphenhydramine Hcl) .... As Needed Allergy 5)  Nuvaring 0.12-0.015 Mg/24hr Ring (Etonogestrel-Ethinyl Estradiol) .... Insert and Leave in For 3 Weeks, Remove For 1 Week and Repeat 6)  Ranitidine Hcl 300 Mg Tabs (Ranitidine Hcl) .Marland Kitchen.. 1 Tab By Mouth Daily  Allergies (verified): 1)  Sulfamethoxazole (Sulfamethoxazole)  Vital Signs:  Patient profile:   50 year old female Menstrual status:  regular Height:      68 inches Weight:      166 pounds BMI:     25.33 Pulse rate:   64 / minute Pulse rhythm:   regular BP sitting:   112 / 70  (left arm) Cuff size:   regular  Vitals Entered By: June McMurray CMA Duncan Dull) (October 13, 2010 10:26 AM)  Physical Exam  Additional Exam:  Constitutional: generally well appearing Psychiatric: alert and oriented times 3 Abdomen: soft,  non-tender, non-distended, normal bowel sounds    Impression & Recommendations:  Problem # 1:  Improving infectious gastroenteritis, 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 I do suspect that she had some of an infectious process that has already resolved but she may be left with some postinfectious irritable bowel.  Patient Instructions: 1)  You will be scheduled to have a colonoscopy. 2)  The medication list was reviewed and reconciled.  All changed / newly prescribed medications were explained.  A complete medication list was provided to the patient / caregiver.

## 2010-11-26 NOTE — Progress Notes (Signed)
Summary: Call A Nurse   Call-A-Nurse Triage Call Report Triage Record Num: [id number] Operator: Alphonsa Overall Patient Name: Bayan Hedstrom Call Date & Time: 07/23/2010 10:11:55PM Patient Phone: [phone] PCP: Valetta Mole. Swords Patient Gender: Female PCP Fax : [phone] Patient DOB: 30-Aug-1961 Practice Name: [facility] Reason for Call: LMP 07/02/10. Denies preg or BF. Trayonna calling about abdominal pain. OV 07/23/10 blood test, ecoli. Carinna wanting to take meds to help with pain/diarrhea. Diarrhea better more firm, still with discomfort. 99 oral at 2200. Home care advice given. Gavin Pound to f/u with MD office in am. Protocol(s) Used: Poisoning Recommended Outcome per Protocol: See Provider within 24 hours Reason for Outcome: Sudden onset of diarrhea, usually with abdominal pain, nausea and sometimes vomiting, occurring within 36 hours after eating unpasteurized, raw or undercooked foods OR drinking unpurified or nonchlorinated water Care Advice: Do not take aspirin, ibuprofen, ketoprofen, naproxen, etc., or other pain relieving medications until consulting with provider.  ~ Go to the ED if you have developed bloody diarrhea or signs and symptoms of dehydration, such as dry mouth and tongue; increased pulse rate at rest; concentrated urine; no urine output for 8 hours or more; increasing weakness or drowsiness, or lightheadedness when trying to sit upright or standing.  ~ Call provider immediately if vomiting and diarrhea persist more than 3 days; develop a temperature of 101.5 F (38.6 C) or more; or if having severe abdominal pain or abdominal swelling.  ~  ~ SYMPTOM / CONDITION MANAGEMENT Suspected Food-Borne Illness Care: Drink 2-3 quarts (2-3 liters) of low sugar content fluids, including nonprescription oral hydration solution, per day unless directed otherwise by provider. - If accompanied by vomiting, take the fluids in frequent small sips or suck on ice chips. -  Do not eat solid food until vomiting subsides. - Eat easily digested foods (such as bananas, rice, applesauce, toast, cooked cereals, soup, crackers, baked or boiled potato, or baked chicken or [place] without skin). - Do not eat high fiber, high fat, high sugar content foods, or highly seasoned foods. - Do not drink caffeinated or alcoholic beverages. - Avoid milk and milk products while having symptoms. - As symptoms improve, gradually add back to diet. - Do not use antidiarrheal medications because they may slow elimination of bacteria causing the symptoms. - Application of A&D ointment or witch hazel medicated pads may help anal irritation. - Keep activity at a minimum. - Consult your provider for advice regarding continuing prescription medication.  ~ Avoid food poisoning by safe handling of foods: - Washing hands thoroughly before food preparation or handling. - Cook foods to proper temperature. - Promptly refrigerate perishable foods within 1 to 2 hours of cooking. - Clean food preparation area and utensils with hot soapy water. - Wash all fresh produce including pre-washed fruits and vegetables. - Prepare fruits and vegetables on a different cutting board than meats or poultry.  ~ 07/23/2010 10:31:34PM Page 1 of 2 [email or web address] Call-A-Nurse Triage Call Report Patient Name: Yamilet Mcfayden continuation page/s 09/

## 2010-11-26 NOTE — Assessment & Plan Note (Signed)
Summary: ABD PAIN/?OVARIAN CYSTY, FAM HX ANEURYSM?/PS   Vital Signs:  Patient profile:   50 year old female Menstrual status:  regular Height:      68 inches (172.72 cm) Weight:      168 pounds (76.36 kg) O2 Sat:      98 % on Room air Temp:     98.3 degrees F (36.83 degrees C) oral Pulse rate:   88 / minute BP sitting:   138 / 100  (left arm) Cuff size:   regular  Vitals Entered By: Josph Macho RMA (August 10, 2010 11:59 AM)  O2 Flow:  Room air  Serial Vital Signs/Assessments:  Time      Position  BP       Pulse  Resp  Temp     By                     134/90                         Danise Edge MD  CC: Abd pain X2 weeks/ possible cyst?/ Family history of aneurysms/ CF Is Patient Diabetic? No   History of Present Illness: patient is a 50 year old Caucasian female in today for evaluation of abdominal pain. She was seen in late September with watery profuse diarrhea and a temperature of 100 testing was negative for Escherichia coli and other pathogens despite the fact that she is coughing she is on the dictated knee. Her having fever she is no longer having bloody or watery stool. She continues to some suprapubic discomfort. She is also noting some back pain which is worsening. The suprapubic discomfort is generally central and occasionally radiates to bilateral lower quadrants but hasn't improved over this last week after having 6 or 7 loose stool daily she developed some mild hemorrhoids but scant blood but that has all resolved at present she is having some urinary frequency but denies dysuria or urgency. She denies fevers but she did have what she describes as a hot last night. She is also noting some increased dyspepsia she has some mild burning and sour taste at times with some sharp fleeting pains. Denies palpitations, shortness of breath, nausea vomiting. She's not having any significant vaginal bleeding or symptoms and does maintain herself on the NuvaRing. This month when she  moved removed the ring she has a shorter than usual. Bleeding roughly one day as opposed to 3 as usual  Current Medications (verified): 1)  Alprazolam 0.25 Mg Tabs (Alprazolam) .... Take 1 Tablet By Mouth Once A Day As Needed 2)  Triamcinolone Acetonide 0.5 % Crea (Triamcinolone Acetonide) .... As Directed As Needed 3)  Flonase 50 Mcg/act Susp (Fluticasone Propionate) .... One To Two Sprays Daily 4)  Benadryl 25 Mg Tabs (Diphenhydramine Hcl) .... As Needed Allergy 5)  Nuvaring 0.12-0.015 Mg/24hr Ring (Etonogestrel-Ethinyl Estradiol) .... Insert and Leave in For 3 Weeks, Remove For 1 Week and Repeat  Allergies (verified): 1)  Sulfamethoxazole (Sulfamethoxazole)  Past History:  Past medical history reviewed for relevance to current acute and chronic problems. Social history (including risk factors) reviewed for relevance to current acute and chronic problems.  Past Medical History: Reviewed history from 01/23/2008 and no changes required. ovarian cysts Anemia-NOS--secondary to menses  Social History: Reviewed history from 02/02/2010 and no changes required. Marital Status: single  Occupation: unemployed Regular exercise-no Never Smoked  Review of Systems      See HPI  Physical Exam  General:  Well-developed,well-nourished,in no acute distress; alert,appropriate and cooperative throughout examination Head:  Normocephalic and atraumatic without obvious abnormalities. No apparent alopecia or balding. Nose:  External nasal examination shows no deformity or inflammation. Nasal mucosa are pink and moist without lesions or exudates. Mouth:  Oral mucosa and oropharynx without lesions or exudates.  Teeth in good repair. Neck:  No deformities, masses, or tenderness noted. Lungs:  Normal respiratory effort, chest expands symmetrically. Lungs are clear to auscultation, no crackles or wheezes. Heart:  Normal rate and regular rhythm. S1 and S2 normal without gallop, murmur, click, rub or  other extra sounds. Abdomen:  Bowel sounds positive,abdomen soft and non-tender without masses, organomegaly or hernias noted.no guarding, no rigidity, no rebound tenderness, and no inguinal hernia.   Extremities:  No clubbing, cyanosis, edema, or deformity noted with normal full range of motion of all joints.   Neurologic:  No cranial nerve deficits noted. Station and gait are normal. Plantar reflexes are down-going bilaterally. DTRs are symmetrical throughout. Sensory, motor and coordinative functions appear intact. Skin:  Intact without suspicious lesions or rashes Cervical Nodes:  No lymphadenopathy noted Psych:  Cognition and judgment appear intact. Alert and cooperative with normal attention span and concentration. No apparent delusions, illusions, hallucinationsmoderately anxious.     Impression & Recommendations:  Problem # 1:  SUPRAPUBIC PAIN (ICD-789.09)  Orders: Urine Pregnancy Test  ([address]) Urinalysis-dipstick only (Medicare patient) (60454UJ) Venipuncture (81191) Specimen Handling (47829) T-Culture, Urine (56213-08657) Urine not hightly suspicious but will culture due to suspicious nature of her symptoms, add Flagyl to the Cipro she was given to cover for possible early diverticulitis. Push clear fluids and cont the probiotic she has just started.  Problem # 2:  GERD (ICD-530.81)  Her updated medication list for this problem includes:    Ranitidine Hcl 300 Mg Tabs (Ranitidine hcl) .Marland Kitchen... 1 tab by mouth daily Avoid offending foods  Problem # 3:  DIARRHEA (ICD-787.91)  Orders: Venipuncture (84696) Specimen Handling (29528) TLB-CBC Platelet - w/Differential (85025-CBCD) Imroved just 1 formed BM daily now  Complete Medication List: 1)  Alprazolam 0.25 Mg Tabs (Alprazolam) .... Take 1 tablet by mouth once a day as needed 2)  Triamcinolone Acetonide 0.5 % Crea (Triamcinolone acetonide) .... As directed as needed 3)  Flonase 50 Mcg/act Susp (Fluticasone propionate) .... One  to two sprays daily 4)  Benadryl 25 Mg Tabs (Diphenhydramine hcl) .... As needed allergy 5)  Nuvaring 0.12-0.015 Mg/24hr Ring (Etonogestrel-ethinyl estradiol) .... Insert and leave in for 3 weeks, remove for 1 week and repeat 6)  Ranitidine Hcl 300 Mg Tabs (Ranitidine hcl) .Marland Kitchen.. 1 tab by mouth daily 7)  Cipro 500 Mg Tabs (Ciprofloxacin hcl) .Marland Kitchen.. 1 tab by mouth by two times a day x 10 days 8)  Metronidazole 500 Mg Tabs (Metronidazole) .Marland Kitchen.. 1 tab by mouth three times a day x 10 days  Patient Instructions: 1)  Please schedule a follow-up appointment as needed if symptoms worsen or do not improve 2)  Push clear fluids 3)  Avoid foods high in acid(tomatoes, citrus juices,spicy foods).Avoid eating within two hours of lying down or before exercising. Do not over eat: try smaller more frequent meals. Elevate head of bed twelve inches when sleeping.  Prescriptions: METRONIDAZOLE 500 MG TABS (METRONIDAZOLE) 1 tab by mouth three times a day x 10 days  #30 x 0   Entered and Authorized by:   Danise Edge MD   Signed by:   Danise Edge MD on 08/10/2010   Method used:  Electronically to        [facility]  [phone]* (retail)       [address] [place], [place]  [address]       Ph: [phone] or [phone]       Fax: [phone]   RxID:   [id number] CIPRO 500 MG TABS (CIPROFLOXACIN HCL) 1 tab by mouth by two times a day x 10 days  #20 x 0   Entered and Authorized by:   Danise Edge MD   Signed by:   Danise Edge MD on 08/10/2010   Method used:   Electronically to        [facility]  [phone]* (retail)       [address] [place], [place]  [address]       Ph: [phone] or [phone]       Fax: [phone]   RxID:   [id number] RANITIDINE HCL 300 MG TABS (RANITIDINE HCL) 1 tab by mouth daily  #30 x 2   Entered and Authorized by:   Danise Edge MD   Signed by:   Danise Edge MD on 08/10/2010   Method used:   Electronically to        [facility]   [phone]* (retail)       [address] [place], [place]  [address]       Ph: [phone] or [phone]       Fax: [phone]   RxID:   [id number]    Orders Added: 1)  Urine Pregnancy Test  [81025] 2)  Urinalysis-dipstick only (Medicare patient) [81003QW] 3)  Venipuncture [32671] 4)  Specimen Handling [99000] 5)  T-Culture, Urine [24580-99833] 6)  TLB-CBC Platelet - w/Differential [85025-CBCD]    Laboratory Results   Urine Tests    Routine Urinalysis   Color: yellow Appearance: Clear Glucose: negative   (Normal Range: Negative) Bilirubin: negative   (Normal Range: Negative) Ketone: 1+   (Normal Range: Negative) Spec. Gravity: >=1.030   (Normal Range: 1.003-1.035) Blood: negative   (Normal Range: Negative) pH: 5.5   (Normal Range: 5.0-8.0) Protein: trace   (Normal Range: Negative) Urobilinogen: 0.2   (Normal Range: 0-1) Nitrite: negative   (Normal Range: Negative) Leukocyte Esterace: negative   (Normal Range: Negative)    Urine HCG: negative Comments: Rita Ohara  August 10, 2010 1:44 PM    Notify UA, not remarkable but a few findings and symptoms will have the urine cultured and then I sent in a rx for the Flagyl that she and I discussed to cover the possiblity of Diverticulitis  I left a message for pt to return my call/ CF  Informed patient/ CF

## 2010-11-26 NOTE — Progress Notes (Signed)
Summary: lab stick  Phone Note Call from Patient   Caller: Patient Call For: Birdie Sons MD Summary of Call: Pt had labs today and now has a possible hematoma?  Tape was itching .......... [phone] Initial call taken by: Lynann Beaver CMA,  May 01, 2010 11:05 AM  Follow-up for Phone Call        probably should ice the area and keep elevated Follow-up by: Birdie Sons MD,  May 01, 2010 2:19 PM  Additional Follow-up for Phone Call Additional follow up Details #1::        Pt notified and thinks it may be an allergic reaction, and took Benadryl. Additional Follow-up by: Lynann Beaver CMA,  May 01, 2010 2:24 PM

## 2010-11-26 NOTE — Letter (Signed)
Summary: New Patient letter  [facility]  [address] [place], [place] [address]   Phone: [phone]  Fax: [phone]       09/07/2010 MRN: [id number]  [facility] [address] [place], [place]  [address]  Dear Ms. Spayd,  Welcome to the Gastroenterology Division at [facility].    You are scheduled to see Dr.  Marina Goodell on 10-20-10 at 9:15am on the 3rd floor at [facility], [address]  We ask that you try to arrive at our office 15 minutes prior to your appointment time to allow for check-in.  We would like you to complete the enclosed self-administered evaluation form prior to your visit and bring it with you on the day of your appointment.  We will review it with you.  Also, please bring a complete list of all your medications or, if you prefer, bring the medication bottles and we will list them.  Please bring your insurance card so that we may make a copy of it.  If your insurance requires a referral to see a specialist, please bring your referral form from your primary care physician.  Co-payments are due at the time of your visit and may be paid by cash, check or credit card.     Your office visit will consist of a consult with your physician (includes a physical exam), any laboratory testing he/she may order, scheduling of any necessary diagnostic testing (e.g. x-ray, ultrasound, CT-scan), and scheduling of a procedure (e.g. Endoscopy, Colonoscopy) if required.  Please allow enough time on your schedule to allow for any/all of these possibilities.    If you cannot keep your appointment, please call [phone] to cancel or reschedule prior to your appointment date.  This allows [place] the opportunity to schedule an appointment for another patient in need of care.  If you do not cancel or reschedule by 5 p.m. the business day prior to your appointment date, you will be charged a $50.00 late cancellation/no-show fee.    Thank you  for choosing [facility] for your medical needs.  We appreciate the opportunity to care for you.  Please visit [place] at our website  to learn more about our practice.                     Sincerely,                                                             The Gastroenterology Division

## 2010-11-26 NOTE — Progress Notes (Signed)
Summary: Pt req refills of Flonase and Clotrimazol Betamethasone Cream  Phone Note Refill Request Call back at Home Phone [phone] Message from:  Patient on July 13, 2010 3:52 PM  Refills Requested: Medication #1:  FLONASE 50 MCG/ACT SUSP one to two sprays daily CLOTRIMAZOLE AND BETAMETHASONE CREAM 15GRAMS 0.53 OZ APPLY TO AFFECTED AREA TWICE DAILY as needed. Pls [facility] in [facility] [phone]   Method Requested: Telephone to Pharmacy Initial call taken by: Lucy Antigua,  July 13, 2010 3:52 PM    Prescriptions: Aleda Grana 50 MCG/ACT SUSP (FLUTICASONE PROPIONATE) one to two sprays daily  #3 x 2   Entered by:   Kern Reap CMA (AAMA)   Authorized by:   Birdie Sons MD   Signed by:   Kern Reap CMA (AAMA) on 07/13/2010   Method used:   Electronically to        [facility]  [phone]* (retail)       [address] [place], [place]  [address]       Ph: [phone] or [phone]       Fax: [phone]   RxID:   [id number]

## 2010-11-26 NOTE — Progress Notes (Signed)
Summary: ua results  Phone Note Call from Patient Call back at Home Phone [phone]   Caller: Patient Call For: Birdie Sons MD Summary of Call: pt would like ua results. Pt is requesting dr swords nurse to rtc.  Initial call taken by: Heron Sabins,  August 13, 2010 9:07 AM  Follow-up for Phone Call        neg urine culture. please notify patient Follow-up by: Danise Edge MD,  August 13, 2010 10:33 AM  Additional Follow-up for Phone Call Additional follow up Details #1::        Phone busy. Additional Follow-up by: Lynann Beaver CMA,  August 13, 2010 10:37 AM    Additional Follow-up for Phone Call Additional follow up Details #2::    Discussed pt's results and symptoms.  She will cal if still symptomatic after Cipro and Flagyl treatment. Follow-up by: Lynann Beaver CMA,  August 13, 2010 11:04 AM

## 2010-11-26 NOTE — Progress Notes (Signed)
Summary: repeat test?  Phone Note Call from Patient Call back at Home Phone [phone]   Caller: Patient---triage vm Summary of Call: had a test done recently for ovarian cyst.  should she repeat the test? please advise. Initial call taken by: Warnell Forester,  September 11, 2010 3:12 PM  Follow-up for Phone Call        told pt to f/u with Dr Billy Coast since this would be specialty.  Pt agreed Follow-up by: Alfred Levins, CMA,  September 11, 2010 4:26 PM

## 2010-11-26 NOTE — Progress Notes (Signed)
  Phone Note Call from Patient   Caller: Patient Call For: Birdie Sons MD Summary of Call: Please call pt with lab results. [phone] Initial call taken by: Lynann Beaver CMA AAMA,  August 19, 2010 1:07 PM  Follow-up for Phone Call        see append Follow-up by: Birdie Sons MD,  August 19, 2010 1:40 PM  Additional Follow-up for Phone Call Additional follow up Details #1::        Notified pt. Additional Follow-up by: Lynann Beaver CMA AAMA,  August 19, 2010 1:47 PM

## 2010-11-26 NOTE — Progress Notes (Signed)
Summary: referral to GI  Phone Note Call from Patient   Caller: Patient Call For: Birdie Sons MD Summary of Call: Pt's CA125 is normal.  Would like a referral to a GI MD. Initial call taken by: Lynann Beaver CMA AAMA,  September 04, 2010 9:56 AM  Follow-up for Phone Call        ok Follow-up by: Birdie Sons MD,  September 04, 2010 1:10 PM  New Problems: ABDOMINAL PAIN OTHER SPECIFIED SITE (ICD-789.09)   New Problems: ABDOMINAL PAIN OTHER SPECIFIED SITE (ICD-789.09)

## 2010-11-26 NOTE — Progress Notes (Signed)
  Phone Note Call from Patient   Caller: Patient Call For: Birdie Sons MD Summary of Call: Pt is asking for lab results.  Needs meds for abdominal pain..........diffuse.........diarrhea better, but pain is worse.  No vomiting.  Chills and fever...Marland KitchenMarland KitchenMarland Kitchenup to 99 .4.  Cannot go through the day in this much pain. [phone] Initial call taken by: Lynann Beaver CMA,  July 24, 2010 8:31 AM  Follow-up for Phone Call        labs are normal emperic ABX flagyl 500 mg by mouth three times a day for 7 days Follow-up by: Birdie Sons MD,  July 24, 2010 11:20 AM    New/Updated Medications: FLAGYL 500 MG TABS (METRONIDAZOLE) one by mouth three times a day x 7 days.  NO ALCOHOL Prescriptions: FLAGYL 500 MG TABS (METRONIDAZOLE) one by mouth three times a day x 7 days.  NO ALCOHOL  #21 x 0   Entered by:   Lynann Beaver CMA   Authorized by:   Birdie Sons MD   Signed by:   Lynann Beaver CMA on 07/24/2010   Method used:   [facility]ron[facility] to        [facility]  [phone]* (retail)       [address] [place], [place]  [address]       Ph: [phone] or [phone]       Fax: [phone]   RxID:   [id number]  Pt. notified.

## 2010-11-26 NOTE — Progress Notes (Signed)
Summary: Call-A-Nurse Report  Seeing Dr. Abner Greenspan today.  Rudy Jew, RN  August 10, 2010 10:53 AM   Call-A-Nurse Triage Call Report Triage Record Num: [id number] Operator: Valene Bors Patient Name: [name] Call Date & Time: 08/08/2010 4:44:45PM Patient Phone: [phone] PCP: Valetta Mole. [name] Patient Gender: Female PCP Fax : [phone] Patient DOB: December 21, 1960 Practice Name: [facility] Reason for Call: LMP 07/31/10 [name] is calling about having a stomach virus with a fever a few weeks ago and she took Flagyl and the pain went away. She started with some lower and mid back pain and lower abdominal pain onset 08/05/10. She is afebrile and does not have any diarrhea. He stomach does feel bloated and she is having trouble emptying her bladder completely, but no burning or pain with urination. She does have hx of Ovarian Cystes. Care advice per Abdominal Pain Protocol and advised to be checked within 24 hours. Protocol(s) Used: Abdominal Pain Recommended Outcome per Protocol: See Provider within 24 hours Reason for Outcome: New onset constant mild or aching lower abdominal pain Care Advice:  ~ Soak in warm bath or place heating pad set on low on abdomen to aid in pain relief. Lying down with legs elevated supported by pillow under knees or lying on side with knees pulled up to chest may help provide relief during more severe cramping.  ~  ~ Rest as much as possible.  ~ Pain medication or laxatives should not be taken until symptoms are evaluated. Call provider immediately if pain gets worse, localized to one spot or lasts more than 4 hours and prevents normal activity; tell provider if vomiting begins after onset of pain or if having any fever.  ~  ~ SYMPTOM / CONDITION MANAGEMENT  ~ CAUTIONS 08/08/2010 5:42:34PM Page 1 of 1 [email or web address]_[email or web address]

## 2010-11-26 NOTE — Assessment & Plan Note (Signed)
Summary: cpx---pap//ccm rsc bmp/njr   Vital Signs:  Patient profile:   50 year old female Menstrual status:  regular LMP:     05/08/2010 Height:      68 inches Weight:      175 pounds BMI:     26.70 Pulse rate:   60 / minute Pulse rhythm:   regular Resp:     12 per minute BP sitting:   102 / 70  (left arm) Cuff size:   regular  Vitals Entered By: Gladis Riffle, RN (May 14, 2010 11:02 AM) CC: cpx, had pap at [facility] Aug 01, 2009--labs done Is Patient Diabetic? No LMP (date): 05/08/2010     Menstrual Status regular Enter LMP: 05/08/2010 Last PAP Result normal per pt   CC:  cpx, had pap at [facility] Oct 8, and 2010--labs done.  History of Present Illness: cpx  Preventive Screening-Counseling & Management  Alcohol-Tobacco     Smoking Status: never  Current Problems (verified): 1)  Anxiety State Nos  (ICD-300.00) 2)  Anemia-nos  (ICD-285.9)  Current Medications (verified): 1)  Alprazolam 0.25 Mg Tabs (Alprazolam) .... Take 1 Tablet By Mouth Once A Day As Needed 2)  Triamcinolone Acetonide 0.5 % Crea (Triamcinolone Acetonide) .... As Directed As Needed 3)  Flonase 50 Mcg/act Susp (Fluticasone Propionate) .... One To Two Sprays Daily 4)  Benadryl 25 Mg Tabs (Diphenhydramine Hcl) .... As Needed Allergy 5)  Nuvaring 0.12-0.015 Mg/24hr Ring (Etonogestrel-Ethinyl Estradiol) .... Insert and Leave in For 3 Weeks, Remove For 1 Week and Repeat  Allergies: 1)  Sulfamethoxazole (Sulfamethoxazole)  Past History:  Past Medical History: Last updated: 01/23/2008 ovarian cysts Anemia-NOS--secondary to menses  Past Surgical History: Last updated: 01/23/2008 laparoscopy for ovarian cysts  Family History: Last updated: 05/14/2010 Father: alive in 2003/04/02 with CAD, AAA, deceased lung CA (complications of therapy) Mother: deceased MI age 83 Siblings:brothers 2 half healthy---one half sister healthy Family History of Aneurysm Aortic Family History of CAD Female 1st degree  relative <60 Family History of CAD Female 1st degree relative <50  Social History: Last updated: 02/02/2010 Marital Status: single  Occupation: unemployed Regular exercise-no Never Smoked  Risk Factors: Exercise: no (01/23/2008)  Risk Factors: Smoking Status: never (05/14/2010)  Family History: Father: alive in Apr 02, 2003 with CAD, AAA, deceased lung CA (complications of therapy) Mother: deceased MI age 32 Siblings:brothers 2 half healthy---one half sister healthy Family History of Aneurysm Aortic Family History of CAD Female 1st degree relative <60 Family History of CAD Female 1st degree relative <50  Physical Exam  General:  alert and well-developed.   Head:  normocephalic and atraumatic.   Eyes:  pupils equal and pupils round.   Ears:  R ear normal and L ear normal.   Nose:  no external deformity and no external erythema.   Neck:  No deformities, masses, or tenderness noted. Chest Wall:  No deformities, masses, or tenderness noted. Lungs:  normal respiratory effort and no intercostal retractions.   Heart:  normal rate and regular rhythm.   Abdomen:  Bowel sounds positive,abdomen soft and non-tender without masses, organomegaly or hernias noted. Msk:  No deformity or scoliosis noted of thoracic or lumbar spine.   Pulses:  R radial normal and L radial normal.   Neurologic:  cranial nerves II-XII intact and gait normal.   Skin:  turgor normal and color normal.   Cervical Nodes:  no anterior cervical adenopathy and no posterior cervical adenopathy.   Psych:  memory intact for recent and remote and normally interactive.  Impression & Recommendations:  Problem # 1:  Preventive Health Care (ICD-V70.0) health maint UTD  Problem # 2:  ANEMIA-NOS (ICD-285.9) resolved---remove from problem list Hgb: 12.1 (05/01/2010)   Hct: 35.5 (05/01/2010)   Platelets: 269.0 (05/01/2010) RBC: 3.79 (05/01/2010)   RDW: 13.0 (05/01/2010)   WBC: 8.1 (05/01/2010) MCV: 93.5 (05/01/2010)   MCHC: 34.0  (05/01/2010) Ferritin: 14.3 (11/04/2006) Iron: 129 (11/04/2006)   % Sat: 30.8 (11/04/2006) TSH: 2.77 (05/01/2010)  Complete Medication List: 1)  Alprazolam 0.25 Mg Tabs (Alprazolam) .... Take 1 tablet by mouth once a day as needed 2)  Triamcinolone Acetonide 0.5 % Crea (Triamcinolone acetonide) .... As directed as needed 3)  Flonase 50 Mcg/act Susp (Fluticasone propionate) .... One to two sprays daily 4)  Benadryl 25 Mg Tabs (Diphenhydramine hcl) .... As needed allergy 5)  Nuvaring 0.12-0.015 Mg/24hr Ring (Etonogestrel-ethinyl estradiol) .... Insert and leave in for 3 weeks, remove for 1 week and repeat  Other Orders: Tdap => 67yrs IM (16109) Admin 1st Vaccine (60454)   Preventive Care Screening  Pap Smear:    Date:  08/01/2009    Next Due:  07/2010    Results:  normal per pt     Preventive Care Screening  Pap Smear:    Date:  08/01/2009    Next Due:  07/2010    Results:  normal per pt     Immunizations Administered:  Tetanus Vaccine:    Vaccine Type: Tdap    Site: left deltoid    Mfr: Gl[employer]oSmithK[employer]    Dose: 0.5 ml    Route: IM    Given by: Gladis Riffle, RN    Exp. Date: 11/13/2011    Lot #: [id number]     Mammogram  Procedure date:  08/08/2009  Findings:      MM Digital Screening - STATUS: Final  .                                         Perform Date: 14Oct10 11:35  Ordered By: Owens Loffler        Ordered Date: 47Oct10 11:32  Facility: [facility]                                Department: MM  Service Report Text  [facility] Accession Number: [id number]     DG SCREEN MAMMOGRAM BILATERAL   Bilateral CC and MLO view(s) were taken.    DIGITAL SCREENING MAMMOGRAM WITH CAD:   The breast tissue is heterogeneously dense.  No masses or malignant   type calcifications are   identified.  Compared with prior studies.    Images were processed with CAD.    IMPRESSION:   No specific mammographic evidence of malignancy.  Next screening   mammogram is recommended in  one   year.    A result letter of this screening mammogram will be mailed directly   to the patient.    ASSESSMENT: Negative - BI-RADS 1    Screening mammogram in 1 year.   ,    Read By:  Elberta Fortis   Released By:  Elberta Fortis  Additional Information  HL7 RESULT STATUS : F  External IF Update Timestamp : 2009-08-08:09:58:40.000000

## 2010-11-26 NOTE — Assessment & Plan Note (Signed)
Summary: back pain/dm   Vital Signs:  Patient profile:   50 year old female Menstrual status:  regular Weight:      167 pounds Temp:     98.5 degrees F oral Pulse rate:   76 / minute BP sitting:   116 / 80  (left arm) Cuff size:   regular  Vitals Entered By: Alfred Levins, CMA (August 18, 2010 8:42 AM) CC: back pain   CC:  back pain.  History of Present Illness: long hx of chronic back pain she is here because back pain worsened while she was sick with diarrhea she states diarrhea has completely resolved she is left with some back pain(also improving) back pain has been migratory from sacral area to flank to midscaplar.  She admits to a great deal of stress: for some reason she felt stressed during illness. Taking more xanax.   All other systems reviewed and were negative except had one hot flash last week and feeling more anxious than usual  All other systems reviewed and were negative   Current Medications (verified): 1)  Alprazolam 0.25 Mg Tabs (Alprazolam) .... Take 1 Tablet By Mouth Once A Day As Needed 2)  Triamcinolone Acetonide 0.5 % Crea (Triamcinolone Acetonide) .... As Directed As Needed 3)  Flonase 50 Mcg/act Susp (Fluticasone Propionate) .... One To Two Sprays Daily 4)  Benadryl 25 Mg Tabs (Diphenhydramine Hcl) .... As Needed Allergy 5)  Nuvaring 0.12-0.015 Mg/24hr Ring (Etonogestrel-Ethinyl Estradiol) .... Insert and Leave in For 3 Weeks, Remove For 1 Week and Repeat 6)  Ranitidine Hcl 300 Mg Tabs (Ranitidine Hcl) .Marland Kitchen.. 1 Tab By Mouth Daily  Allergies (verified): 1)  Sulfamethoxazole (Sulfamethoxazole)  Physical Exam  General:  developed well-nourished female in no acute distress. HEENT exam atraumatic, normocephalic. No icterus. Neck is supple. Chest clear auscultation without increased work of breathing. Cardiac exam S1-S2 are normal. Abdominal exam active bowel sounds, soft, nontender even to deep palpation. Musculoskeletal exam. She has no paraspinal  tenderness no tenderness to palpation of her flanks. Neurologic exam no rashes. Deep tendon reflexes are intact in the lower extremities.   Impression & Recommendations:  Problem # 1:  BACK PAIN (ICD-724.5)  unclear cause i doubt this is of significance but it's worth evaluating briefly check ESR  Orders: Sedimentation Rate, non-automated (11914) Venipuncture (78295)  Complete Medication List: 1)  Alprazolam 0.25 Mg Tabs (Alprazolam) .... Take 1 tablet by mouth once a day as needed 2)  Triamcinolone Acetonide 0.5 % Crea (Triamcinolone acetonide) .... As directed as needed 3)  Flonase 50 Mcg/act Susp (Fluticasone propionate) .... One to two sprays daily 4)  Benadryl 25 Mg Tabs (Diphenhydramine hcl) .... As needed allergy 5)  Nuvaring 0.12-0.015 Mg/24hr Ring (Etonogestrel-ethinyl estradiol) .... Insert and leave in for 3 weeks, remove for 1 week and repeat 6)  Ranitidine Hcl 300 Mg Tabs (Ranitidine hcl) .Marland Kitchen.. 1 tab by mouth daily   Orders Added: 1)  Est. Patient Level IV [62130] 2)  Sedimentation Rate, non-automated [85651] 3)  Venipuncture [86578]  Appended Document: Orders Update    Clinical Lists Changes  Observations: Added new observation of COMMENTS2: Wynona Canes, CMA  August 18, 2010 10:02 AM  (08/18/2010 10:02)      Laboratory Results   Blood Tests   Date/Time Recieved: August 18, 2010 10:02 AM  Date/Time Reported: August 18, 2010 10:02 AM   SED rate: 10  Comments: Wynona Canes, CMA  August 18, 2010 10:02 AM     Appended Document:  back pain/dm call patient. labs normal (sed rate)  Appended Document: back pain/dm notified pt.

## 2010-11-26 NOTE — Progress Notes (Signed)
Summary: Refill Alprazolam  Phone Note From Pharmacy   [facility]  [address] [facility]  [phone]* Summary of Call:  [facility] phone number [phone] Initial call taken by: Kathrynn Speed CMA,  July 15, 2010 10:04 AM    Prescriptions: ALPRAZOLAM 0.25 MG TABS (ALPRAZOLAM) Take 1 tablet by mouth once a day as needed  #20 x 1   Entered by:   Kathrynn Speed CMA   Authorized by:   Birdie Sons MD   Signed by:   Kathrynn Speed CMA on 07/15/2010   Method used:   Historical   RxID:   [id number]

## 2010-12-11 ENCOUNTER — Telehealth: Payer: Self-pay | Admitting: Gastroenterology

## 2010-12-11 ENCOUNTER — Encounter (INDEPENDENT_AMBULATORY_CARE_PROVIDER_SITE_OTHER): Payer: Self-pay | Admitting: *Deleted

## 2010-12-16 NOTE — Progress Notes (Signed)
Summary: Pt in PV now- need for colon?  Phone Note Call from Patient   Summary of Call: Dr. Christella Hartigan-- Mr Kloeppel is here in [facility].  She says that she is 100%  symptom free, bowel habits are back to normal.  Does not have any problems w/ foods she eats. Has not had any problems since end of December.   Do you still want her to proceed w/ colonoscopy? Initial call taken by: Ezra Sites RN,  December 11, 2010 10:50 AM  Follow-up for Phone Call        no, we can cancel it.  Should plan on recall colonoscopy april 2013, she will be 51. Follow-up by: Rachael Fee MD,  December 11, 2010 10:55 AM  Additional Follow-up for Phone Call Additional follow up Details #1::        Colonoscopy cancelled.  Recall entered into EPIC. # [id number].  Pt notified. Additional Follow-up by: Ezra Sites RN,  December 11, 2010 11:07 AM

## 2010-12-16 NOTE — Miscellaneous (Signed)
Summary: [facility] PV- colon cancelled  Clinical Lists Changes  Colon cancelled.  See phone note 12/11/2010.

## 2010-12-23 ENCOUNTER — Other Ambulatory Visit: Payer: Self-pay | Admitting: Gastroenterology

## 2010-12-31 ENCOUNTER — Encounter: Payer: Self-pay | Admitting: Family Medicine

## 2010-12-31 ENCOUNTER — Other Ambulatory Visit: Payer: Self-pay | Admitting: Internal Medicine

## 2010-12-31 ENCOUNTER — Ambulatory Visit (INDEPENDENT_AMBULATORY_CARE_PROVIDER_SITE_OTHER): Payer: Self-pay | Admitting: Family Medicine

## 2010-12-31 VITALS — BP 110/72 | Temp 98.5°F | Ht 68.5 in | Wt 172.0 lb

## 2010-12-31 DIAGNOSIS — R21 Rash and other nonspecific skin eruption: Secondary | ICD-10-CM

## 2010-12-31 DIAGNOSIS — F411 Generalized anxiety disorder: Secondary | ICD-10-CM

## 2010-12-31 DIAGNOSIS — J329 Chronic sinusitis, unspecified: Secondary | ICD-10-CM

## 2010-12-31 MED ORDER — ALPRAZOLAM 0.25 MG PO TABS: 0.2500 mg | ORAL_TABLET | Freq: Every evening | ORAL | Status: DC | PRN

## 2010-12-31 MED ORDER — TRIAMCINOLONE ACETONIDE 0.5 % EX CREA: TOPICAL_CREAM | Freq: Two times a day (BID) | CUTANEOUS | Status: DC

## 2010-12-31 MED ORDER — AZITHROMYCIN 250 MG PO TABS: ORAL_TABLET | ORAL | Status: AC

## 2010-12-31 NOTE — Progress Notes (Signed)
  Subjective:    Patient ID: [name], female    DOB: May 20, 1961, 50 y.o.   MRN: [id number]  HPI  Patient seen as a work in with acute illness. Almost 2 weeks ago developed head cold type symptoms with nasal congestion and some sore throat. Now presents with cough which is occasionally productive. Progressive facial pain yellowish nasal discharge. No history of asthma. Over-the-counter medications without  relief. No history of smoking. Denies any nausea, vomiting, or diarrhea   Review of Systems     Objective:   Physical Exam  patient is alert and nontoxic in appearance. Afebrile.  Oropharynx reveals 2+ symmetrically enlarged tonsils but no exudate and no erythema.  Nasal exam erythematous mucosa. Tender over maxillary sinuses Eardrums no acute changes Neck supple no adenopathy Chest clear to auscultation Heart regular rhythm and rate       Assessment & Plan:   acute sinusitis. Zithromax for 5 days. Over the counter decongestants as tolerated

## 2011-03-12 NOTE — H&P (Signed)
[name], [name] NO.:  [id number]   MEDICAL RECORD NO.:  [id number]          PATIENT TYPE:  EMS   LOCATION:  MAJO                         FACILITY:  [facility]   PHYSICIAN:  Renato Battles, M.D.     DATE OF BIRTH:  Oct 15, 1961   DATE OF ADMISSION:  06/28/2005  DATE OF DISCHARGE:                                HISTORY & PHYSICAL   REASON FOR ADMISSION:  Chest pain.   PRIMARY CARE PHYSICIAN/CARDIOLOGIST:  Jaclyn Prime. Lucas Mallow, M.D.   HISTORY OF PRESENT ILLNESS:  Patient is a pleasant 50 year old white female  who had chest pain for the last two days.  The pain is nonexertional,  located in the left anterior part of her chest.  Radiated to the left  shoulder, occurs mainly with deep breath and resolves with shallow breath.  She is not taking deep breaths.  No nausea or vomiting.  No sweating or  palpitations.  She had similar events two weeks ago after she had an  emotional turmoil secondary to a neighbor's death.   REVIEW OF SYSTEMS:  CONSTITUTIONAL:  No fever or chills.  No night sweats.  No weight changes.  Positive for anxiety.  GI:  No nausea, vomiting,  diarrhea, or constipation.  Positive for heartburn and frequent night  burping.  CARDIOPULMONARY:  Positive for chest pain, as above.  No shortness  of breath.  No orthopnea.  No PND.  No cough.  GU:  No dysuria, hematuria,  or retention.   PAST MEDICAL HISTORY:  1.  Anxiety and history of panic attacks.  2.  History of migraine headaches back in her teens.   PAST SURGICAL HISTORY:  Laparoscopic ovarian cyst removal.   FAMILY HISTORY:  Positive for CAD, diabetes, and aneurysms.   SOCIAL HISTORY:  Denies tobacco, alcohol, or drugs.  She does office work.  Single.  No children.   ALLERGIES:  SULFA.   HOME MEDICATIONS:  1.  Occasional aspirin.  2.  Xanax.   PHYSICAL EXAMINATION:  GENERAL:  Patient is alert and oriented x3.  No acute  distress.  VITAL SIGNS:  Temperature 98.9, heart rate 68, respiratory rate  20, blood  pressure 104/59.  HEENT:  Head is normocephalic and atraumatic.  Pupils are equal, round and  reactive to light and accommodation.  Extraocular movements are intact  bilaterally.  NECK:  No adenopathy.  No thyromegaly.  No JVD.  CHEST:  Clear to auscultation bilaterally.  No wheezing, rales, or rhonchi.  HEART:  Regular rate and rhythm.  No murmurs.  ABDOMEN:  Soft, nontender, nondistended.  Normoactive bowel sounds.  EXTREMITIES:  No clubbing, cyanosis or edema.   STUDIES:  Point-of-care cardiac enzymes were negative x3.  CBC showed mildly  decreased hemoglobin of 11.4, otherwise normal.  Electrolytes were all  within normal.  BNP was less than 30.   EKG showed right axis deviation.   Chest x-ray showed mild COPD changes.   ASSESSMENT/PLAN:  1.  Chest pain:  Very atypical for cardiac.  Differential diagnosis by      likelihood includes esophageal spasm, anxiety attack, and much less  likely pulmonary embolus and acute coronary syndrome.  Going to admit      the patient for observation, check cardiac enzymes q.8h. x3, start daily      aspirin but no anticoagulation.  Going to check chest CTA and start the      patient on Protonix and low dose Xanax.  2.  Heartburn:  Start Protonix.  3.  Anxiety:  Low dose Xanax.      Renato Battles, M.D.  Electronically Signed     SA/MEDQ  D:  06/28/2005  T:  06/28/2005  Job:  [id number]   cc:   Jaclyn Prime. Lucas Mallow, M.D.  [address]  [place]  [place] [address]  Fax: [phone]

## 2011-03-12 NOTE — Discharge Summary (Signed)
[name], [name]          ACCOUNT NO.:  [id number]   MEDICAL RECORD NO.:  [id number]          PATIENT TYPE:  INP   LOCATION:  4707                         FACILITY:  [facility]   PHYSICIAN:  Isidor Holts, M.D.  DATE OF BIRTH:  1961-03-25   DATE OF ADMISSION:  06/28/2005  DATE OF DISCHARGE:  06/29/2005                                 DISCHARGE SUMMARY   PRIMARY CARE PHYSICIAN:  Jaclyn Prime. Lucas Mallow, M.D.   DISCHARGE DIAGNOSES:  1.  Atypical chest pain.  2.  Upper respiratory tract infection.  3.  Gastroesophageal reflux disease.  4.  Anxiety.   DISCHARGE MEDICATIONS:  1.  Darvocet-N 100, 1 p.o. 4 times a day for 3 days.  2.  Protonix 40 mg p.o. daily.  3.  Aspirin 81 mg p.o. daily.  4.  Robitussin cough syrup 10 mL p.r.n. t.i.d.  5.  Z-Pak. Utilize as directed.   PROCEDURES:  1.  Chest x-ray dated June 28, 2005.  This showed mild changes of COPD.      No acute abnormality.  2.  Chest CT angiogram dated June 29, 2005.  This showed several hepatic      cysts, no embolus or acute thoracic findings.   CONSULTATIONS:  Jaclyn Prime. Lucas Mallow, M.D., cardiologist.   ADMISSION HISTORY:  As in H&P notes of June 28, 2005.   In brief, this is a 50 year old female, with past medical history  significant for anxiety and history of panic attacks as well as history of  migraine in her teens who presents with a 2-day history of chest pain,  nonexertional, located in the left anterior part of the chest radiating to  the left shoulder, exacerbated by deep inspiration.  No associated nausea,  vomiting, or sweating or palpitations.  She was admitted for evaluation,  investigations, and management.   HOSPITAL COURSE:  #1.  ATYPICAL CHEST PAIN:  The patient presents with atypical-sounding chest  pain, characterized by left upper, anterior chest pain, nonexertional,  precipitated/aggravated by deep inspiration. She has no history of recent  long distance travel or immobilization.  Has no  symptoms referable to lower  extremities.  She also has a background history of anxiety and panic  attacks.  A 12-lead EKG showed no acute ischemic changes.  Serial cardiac  enzymes remained unelevated.  BNP was less than 30. D-dimer was less than  0.22.  For completeness, chest CT angiogram was carried out on June 29, 2005, and this showed several hepatic cysts but no pulmonary embolus or  acute thoracic findings.  It is noted that chest x-ray of June 28, 2005,  showed mild changes of COPD but no acute abnormality.  The patient admitted  to some localized chest wall soreness aggravated by movement.  However, at  the time of evaluation on June 26, 2005, this was no longer in evidence.  She was managed with simple analgesics, with complete amelioration of  symptomatology.  It is felt the patient may benefit from outpatient stress  testing given her family history of coronary artery disease.  Cardiology  consultation was called and kindly provided by Dr. Aggie Cosier who  has  undertaken to see patient in his office on July 04, 2005.  He may  arrange a stress test at his discretion/as indicated.  Meanwhile, the  patient has been commenced on low-dose aspirin.   #2.  HISTORY OF ANXIETY:  There were no problems related to this during the  course of patient's hospital stay.   #3.  GASTROESOPHAGEAL REFLUX DISEASE:  It is felt that gastroesophageal  reflux disease may be at least in part, responsible for patient's atypical  chest pain.  She has been commenced on proton pump inhibitor which she is  expected to continue until reviewed by her primary care physician.   #4.  UPPER RESPIRATORY TRACT INFECTION:  The patient, on detailed  questioning, admits to a 2-day history of cough productive of yellowish  phlegm, prior to presentation.  She is afebrile, and chest x-ray of  June 28, 2005, showed no evidence of infiltrate, although chronic  bronchitic changes are noted.  She  has, therefore, been placed on a 5-day  course of Azithromycin and instructed to utilize Robitussin cough syrup,  prn.   DISPOSITION:  The patient was discharged in satisfactory condition on  June 29, 2005.   DIET:  No restrictions.   ACTIVITY:  No restrictions.   WOUND CARE:  Not applicable.   FOLLOW UP:  The patient is to follow up with Dr. Aggie Cosier on June 26, 2005, at 11:00 a.m.  She has Dr. Pollie Friar phone number and has been  instructed to call, and she has verbalized understanding.      Isidor Holts, M.D.  Electronically Signed     CO/MEDQ  D:  07/03/2005  T:  07/03/2005  Job:  [id number]   cc:   Jaclyn Prime. Lucas Mallow, M.D.  [address] [place]  [place]  [place] [address]  Fax: [phone]

## 2011-05-03 ENCOUNTER — Other Ambulatory Visit: Payer: Self-pay | Admitting: *Deleted

## 2011-05-03 DIAGNOSIS — F411 Generalized anxiety disorder: Secondary | ICD-10-CM

## 2011-05-03 MED ORDER — ALPRAZOLAM 0.25 MG PO TABS: 0.2500 mg | ORAL_TABLET | Freq: Every evening | ORAL | Status: DC | PRN

## 2011-06-03 ENCOUNTER — Other Ambulatory Visit (INDEPENDENT_AMBULATORY_CARE_PROVIDER_SITE_OTHER): Payer: Self-pay

## 2011-06-03 DIAGNOSIS — Z0000 Encounter for general adult medical examination without abnormal findings: Secondary | ICD-10-CM

## 2011-06-03 LAB — HEPATIC FUNCTION PANEL
ALT: 19 U/L (ref 0–35)
AST: 20 U/L (ref 0–37)
Alkaline Phosphatase: 56 U/L (ref 39–117)
Bilirubin, Direct: 0.1 mg/dL (ref 0.0–0.3)
Total Bilirubin: 0.8 mg/dL (ref 0.3–1.2)
Total Protein: 7.4 g/dL (ref 6.0–8.3)

## 2011-06-03 LAB — BASIC METABOLIC PANEL
BUN: 15 mg/dL (ref 6–23)
CO2: 28 mEq/L (ref 19–32)
Chloride: 105 mEq/L (ref 96–112)
Glucose, Bld: 86 mg/dL (ref 70–99)
Potassium: 5.1 mEq/L (ref 3.5–5.1)
Sodium: 139 mEq/L (ref 135–145)

## 2011-06-03 LAB — POCT URINALYSIS DIPSTICK
Blood, UA: NEGATIVE
Protein, UA: NEGATIVE
Spec Grav, UA: 1.025
Urobilinogen, UA: 0.2
pH, UA: 6

## 2011-06-03 LAB — CBC WITH DIFFERENTIAL/PLATELET
Basophils Absolute: 0.1 10*3/uL (ref 0.0–0.1)
Basophils Relative: 0.9 % (ref 0.0–3.0)
Monocytes Relative: 8.4 % (ref 3.0–12.0)
Platelets: 249 10*3/uL (ref 150.0–400.0)
RBC: 4.14 Mil/uL (ref 3.87–5.11)
RDW: 13.3 % (ref 11.5–14.6)

## 2011-06-03 LAB — LIPID PANEL
Cholesterol: 179 mg/dL (ref 0–200)
LDL Cholesterol: 93 mg/dL (ref 0–99)
Triglycerides: 56 mg/dL (ref 0.0–149.0)
VLDL: 11.2 mg/dL (ref 0.0–40.0)

## 2011-06-11 ENCOUNTER — Encounter: Payer: Self-pay | Admitting: Internal Medicine

## 2011-06-11 ENCOUNTER — Ambulatory Visit (INDEPENDENT_AMBULATORY_CARE_PROVIDER_SITE_OTHER): Payer: Self-pay | Admitting: Internal Medicine

## 2011-06-11 VITALS — BP 132/88 | HR 84 | Temp 97.7°F | Ht 67.75 in | Wt 171.0 lb

## 2011-06-11 DIAGNOSIS — Z0000 Encounter for general adult medical examination without abnormal findings: Secondary | ICD-10-CM

## 2011-06-11 NOTE — Progress Notes (Signed)
  Subjective:    Patient ID: [name], female    DOB: 12/11/1960, 50 y.o.   MRN: [id number]  HPI  cpx    Review of Systems  patient denies chest pain, shortness of breath, orthopnea. Denies lower extremity edema, abdominal pain, change in appetite, change in bowel movements. Patient denies rashes, musculoskeletal complaints. No other specific complaints in a complete review of systems.      Objective:   Physical Exam   Well-developed well-nourished female in no acute distress. HEENT exam atraumatic, normocephalic, extraocular muscles are intact. Neck is supple. No jugular venous distention no thyromegaly. Chest clear to auscultation without increased work of breathing. Cardiac exam S1 and S2 are regular. Abdominal exam active bowel sounds, soft, nontender. Extremities no edema. Neurologic exam she is alert without any motor sensory deficits. Gait is normal.     Assessment & Plan:  Health Maint UTD

## 2011-08-13 ENCOUNTER — Telehealth: Payer: Self-pay | Admitting: *Deleted

## 2011-08-13 MED ORDER — AMOXICILLIN-POT CLAVULANATE 875-125 MG PO TABS: 1.0000 | ORAL_TABLET | Freq: Two times a day (BID) | ORAL | Status: AC

## 2011-08-13 NOTE — Telephone Encounter (Signed)
augmentin 850. 1 po bid for 7 days

## 2011-08-13 NOTE — Telephone Encounter (Signed)
Pt has infected tooth, and her dentist is off today.  Is asking [facility]

## 2011-08-20 ENCOUNTER — Other Ambulatory Visit: Payer: Self-pay | Admitting: Internal Medicine

## 2011-08-20 NOTE — Telephone Encounter (Signed)
patient  Is calling because she would like 3 more days of ATB.  She states that her sinus infections is better but she is still having some pain with her tooth.  She will try and make it to the free dental clinic next month.  Is this okay to fill?  [facility] [address]

## 2011-09-22 ENCOUNTER — Other Ambulatory Visit: Payer: Self-pay | Admitting: Internal Medicine

## 2011-09-22 DIAGNOSIS — F411 Generalized anxiety disorder: Secondary | ICD-10-CM

## 2011-09-22 MED ORDER — ALPRAZOLAM 0.25 MG PO TABS: 0.2500 mg | ORAL_TABLET | Freq: Every evening | ORAL | Status: DC | PRN

## 2011-09-22 NOTE — Telephone Encounter (Signed)
Pt called and said that [facility] [address] [facility] over numerous refill req for pts ALPRAZolam (XANAX) 0.25 MG tablet and has not gotten a response. Pt is out of med and needs this called in today.

## 2011-10-09 ENCOUNTER — Encounter: Payer: Self-pay | Admitting: Gastroenterology

## 2011-12-06 ENCOUNTER — Ambulatory Visit: Payer: Self-pay | Admitting: Family

## 2011-12-07 ENCOUNTER — Other Ambulatory Visit: Payer: Self-pay | Admitting: Internal Medicine

## 2011-12-07 ENCOUNTER — Encounter: Payer: Self-pay | Admitting: Gastroenterology

## 2011-12-07 DIAGNOSIS — Z1231 Encounter for screening mammogram for malignant neoplasm of breast: Secondary | ICD-10-CM

## 2011-12-15 ENCOUNTER — Ambulatory Visit (HOSPITAL_COMMUNITY)
Admission: RE | Admit: 2011-12-15 | Discharge: 2011-12-15 | Disposition: A | Discharge status: Home / Self Care | Payer: Self-pay | Source: Ambulatory Visit | Attending: Internal Medicine | Admitting: Internal Medicine

## 2011-12-15 DIAGNOSIS — Z1231 Encounter for screening mammogram for malignant neoplasm of breast: Secondary | ICD-10-CM | POA: Insufficient documentation

## 2012-01-07 ENCOUNTER — Ambulatory Visit (AMBULATORY_SURGERY_CENTER): Payer: Self-pay | Admitting: *Deleted

## 2012-01-07 VITALS — Ht 68.0 in | Wt 172.9 lb

## 2012-01-07 DIAGNOSIS — Z1211 Encounter for screening for malignant neoplasm of colon: Secondary | ICD-10-CM

## 2012-01-07 MED ORDER — PEG-KCL-NACL-NASULF-NA ASC-C 100 G PO SOLR: ORAL | Status: DC

## 2012-02-04 ENCOUNTER — Encounter: Payer: Self-pay | Admitting: Gastroenterology

## 2012-02-04 ENCOUNTER — Ambulatory Visit (AMBULATORY_SURGERY_CENTER): Payer: Self-pay | Admitting: Gastroenterology

## 2012-02-04 VITALS — BP 122/78 | HR 70 | Temp 98.0°F | Resp 16 | Ht 68.0 in | Wt 172.0 lb

## 2012-02-04 DIAGNOSIS — Z1211 Encounter for screening for malignant neoplasm of colon: Secondary | ICD-10-CM

## 2012-02-04 DIAGNOSIS — K5730 Diverticulosis of large intestine without perforation or abscess without bleeding: Secondary | ICD-10-CM

## 2012-02-04 MED ORDER — SODIUM CHLORIDE 0.9 % IV SOLN: 500.0000 mL | INTRAVENOUS | Status: DC

## 2012-02-04 NOTE — Patient Instructions (Signed)

## 2012-02-04 NOTE — Op Note (Signed)
[facility] [address] [place], [place]  [address]  COLONOSCOPY PROCEDURE REPORT  PATIENT:  [name], [name]  MR#:  [id number] BIRTHDATE:  1961-03-22, 50 yrs. old  GENDER:  female ENDOSCOPIST:  Rachael Fee, MD REF. BY:  Birdie Sons, M.D. PROCEDURE DATE:  02/04/2012 PROCEDURE:  Colonoscopy 32440 ASA CLASS:  Class II INDICATIONS:  Routine Risk Screening MEDICATIONS:  Fentanyl 75 mcg IV, These medications were titrated to patient response per physician's verbal order, Versed 8 mg IV  DESCRIPTION OF PROCEDURE:   After the risks benefits and alternatives of the procedure were thoroughly explained, informed consent was obtained.  Digital rectal exam was performed and revealed no rectal masses.   The LB 180AL [id number] endoscope was introduced through the anus and advanced to the cecum, which was identified by both the appendix and ileocecal valve, without limitations.  The quality of the prep was good..  The instrument was then slowly withdrawn as the colon was fully examined. <<PROCEDUREIMAGES>> FINDINGS:  Mild diverticulosis was found in the sigmoid to descending colon segments (see image1).  This was otherwise a normal examination of the colon (see image2 and image4). Retroflexed views in the rectum revealed no abnormalities. COMPLICATIONS:  None  ENDOSCOPIC IMPRESSION: 1) Mild diverticulosis in the sigmoid to descending colon segments 2) Otherwise normal examination  RECOMMENDATIONS: 1) You should continue to follow colorectal cancer screening guidelines for "routine risk" patients with a repeat colonoscopy in 10 years. There is no need for FOBT (stool) testing for at least 5 years.  REPEAT EXAM:  10 years  ______________________________ Rachael Fee, MD  n. eSIGNED:   Rachael Fee at 02/04/2012 08:44 AM  Lala Lund, [phone]
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
## 2012-02-04 NOTE — Progress Notes (Signed)
Addended by: Gillermina Hu on: 02/04/2012 09:25 AM   Modules accepted: Orders

## 2012-02-07 ENCOUNTER — Telehealth: Payer: Self-pay | Admitting: *Deleted

## 2012-02-07 NOTE — Telephone Encounter (Signed)
  Follow up Call-  Call back number 02/04/2012  Post procedure Call Back phone  # [phone]  Permission to leave phone message Yes     Patient questions:  Do you have a fever, pain , or abdominal swelling? no Pain Score  0 *  Have you tolerated food without any problems? yes  Have you been able to return to your normal activities? yes  Do you have any questions about your discharge instructions: Diet   no Medications  no Follow up visit  no  Do you have questions or concerns about your Care? no  Actions: * If pain score is 4 or above: No action needed, pain <4.

## 2012-02-17 ENCOUNTER — Other Ambulatory Visit: Payer: Self-pay | Admitting: *Deleted

## 2012-02-17 DIAGNOSIS — F411 Generalized anxiety disorder: Secondary | ICD-10-CM

## 2012-02-17 MED ORDER — ALPRAZOLAM 0.25 MG PO TABS: 0.2500 mg | ORAL_TABLET | Freq: Every evening | ORAL | Status: DC | PRN

## 2012-05-17 ENCOUNTER — Other Ambulatory Visit: Payer: Self-pay | Admitting: *Deleted

## 2012-05-17 DIAGNOSIS — F411 Generalized anxiety disorder: Secondary | ICD-10-CM

## 2012-05-17 MED ORDER — ALPRAZOLAM 0.25 MG PO TABS: 0.2500 mg | ORAL_TABLET | Freq: Every evening | ORAL | Status: DC | PRN

## 2012-06-05 ENCOUNTER — Other Ambulatory Visit (INDEPENDENT_AMBULATORY_CARE_PROVIDER_SITE_OTHER): Payer: Self-pay

## 2012-06-05 DIAGNOSIS — Z0000 Encounter for general adult medical examination without abnormal findings: Secondary | ICD-10-CM

## 2012-06-05 LAB — BASIC METABOLIC PANEL
CO2: 28 mEq/L (ref 19–32)
Chloride: 102 mEq/L (ref 96–112)
Creatinine, Ser: 0.6 mg/dL (ref 0.4–1.2)
Potassium: 4 mEq/L (ref 3.5–5.1)

## 2012-06-05 LAB — POCT URINALYSIS DIPSTICK
Bilirubin, UA: NEGATIVE
Blood, UA: NEGATIVE
Glucose, UA: NEGATIVE
Ketones, UA: NEGATIVE
Spec Grav, UA: 1.025

## 2012-06-05 LAB — HEPATIC FUNCTION PANEL
ALT: 18 U/L (ref 0–35)
Albumin: 3.8 g/dL (ref 3.5–5.2)
Bilirubin, Direct: 0 mg/dL (ref 0.0–0.3)
Total Protein: 7.3 g/dL (ref 6.0–8.3)

## 2012-06-05 LAB — CBC WITH DIFFERENTIAL/PLATELET
Basophils Relative: 0.4 % (ref 0.0–3.0)
Eosinophils Absolute: 0.2 10*3/uL (ref 0.0–0.7)
HCT: 37.3 % (ref 36.0–46.0)
Lymphs Abs: 1.3 10*3/uL (ref 0.7–4.0)
MCHC: 32.5 g/dL (ref 30.0–36.0)
MCV: 93.9 fl (ref 78.0–100.0)
Monocytes Absolute: 0.4 10*3/uL (ref 0.1–1.0)
Neutro Abs: 4.1 10*3/uL (ref 1.4–7.7)
Neutrophils Relative %: 68 % (ref 43.0–77.0)
RBC: 3.97 Mil/uL (ref 3.87–5.11)

## 2012-06-05 LAB — LIPID PANEL
Cholesterol: 176 mg/dL (ref 0–200)
Triglycerides: 63 mg/dL (ref 0.0–149.0)

## 2012-06-05 LAB — TSH: TSH: 2.54 u[IU]/mL (ref 0.35–5.50)

## 2012-06-12 ENCOUNTER — Ambulatory Visit (INDEPENDENT_AMBULATORY_CARE_PROVIDER_SITE_OTHER): Payer: Medicaid Other | Admitting: Internal Medicine

## 2012-06-12 ENCOUNTER — Encounter: Payer: Self-pay | Admitting: Internal Medicine

## 2012-06-12 VITALS — BP 120/78 | HR 64 | Temp 98.6°F | Ht 68.0 in | Wt 175.0 lb

## 2012-06-12 DIAGNOSIS — Z0000 Encounter for general adult medical examination without abnormal findings: Secondary | ICD-10-CM

## 2012-06-12 MED ORDER — FLUTICASONE PROPIONATE 50 MCG/ACT NA SUSP: 2.0000 | Freq: Every day | NASAL | Status: DC | PRN

## 2012-06-12 NOTE — Progress Notes (Signed)
Patient ID: [name], female   DOB: 1960/11/11, 51 y.o.   MRN: [id number] cpx  Past Medical History  Diagnosis Date  . Allergy   . Anemia   . GERD (gastroesophageal reflux disease)     History   Social History  . Marital Status: Single    Spouse Name: N/A    Number of Children: N/A  . Years of Education: N/A   Occupational History  . Not on file.   Social History Main Topics  . Smoking status: Never Smoker   . Smokeless tobacco: Never Used  . Alcohol Use: No  . Drug Use: No  . Sexually Active: Not on file   Other Topics Concern  . Not on file   Social History Narrative  . No narrative on file    Past Surgical History  Procedure Date  . Ovarian cyst removal 1995    dr Rosemary Holms    Family History  Problem Relation Age of Onset  . Heart disease Mother   . Cancer Father 91    lung ca  . Heart disease Father 16    mi/cabg  . Colon cancer Cousin 30    Allergies  Allergen Reactions  . Sulfamethoxazole     flushing    Current Outpatient Prescriptions on File Prior to Visit  Medication Sig Dispense Refill  . ALPRAZolam (XANAX) 0.25 MG tablet Take 1 tablet (0.25 mg total) by mouth at bedtime as needed.  30 tablet  0  . ascorbic acid (VITAMIN C) 500 MG tablet Take 500 mg by mouth 2 (two) times daily.      Marland Kitchen aspirin 81 MG tablet Take 81 mg by mouth daily. Takes 2 tablets daily      . Cyanocobalamin (B-12) 1000 MCG LOZG Take by mouth every other day.      . Ferrous Fumarate-Vitamin C (VITRON-C PO) Take by mouth daily.      . fluticasone (FLONASE) 50 MCG/ACT nasal spray 2 sprays by Nasal route daily.        . Magnesium 400 MG CAPS Take by mouth at bedtime.      . phenylephrine (NEO-SYNEPHRINE) 1 % nasal spray Place 1 drop into the nose at bedtime.      . Probiotic Product (PROBIOTIC FORMULA PO) Take by mouth.      . ranitidine (ZANTAC) 300 MG capsule Take 300 mg by mouth every evening.        . triamcinolone (KENALOG) 0.5 % cream Apply topically 2 (two)  times daily. As needed  30 g  1     patient denies chest pain, shortness of breath, orthopnea. Denies lower extremity edema, abdominal pain, change in appetite, change in bowel movements. Patient denies rashes, musculoskeletal complaints. No other specific complaints in a complete review of systems.   There were no vitals taken for this visit.  Well-developed well-nourished female in no acute distress. HEENT exam atraumatic, normocephalic, extraocular muscles are intact. Neck is supple. No jugular venous distention no thyromegaly. Chest clear to auscultation without increased work of breathing. Cardiac exam S1 and S2 are regular. Abdominal exam active bowel sounds, soft, nontender. Extremities no edema. Neurologic exam she is alert without any motor sensory deficits. Gait is normal.   Well visit: health maint UTD

## 2012-06-29 ENCOUNTER — Other Ambulatory Visit: Payer: Self-pay | Admitting: *Deleted

## 2012-06-29 DIAGNOSIS — F411 Generalized anxiety disorder: Secondary | ICD-10-CM

## 2012-06-29 MED ORDER — ALPRAZOLAM 0.25 MG PO TABS: 0.2500 mg | ORAL_TABLET | Freq: Every evening | ORAL | Status: DC | PRN

## 2012-07-17 ENCOUNTER — Telehealth: Payer: Self-pay | Admitting: Internal Medicine

## 2012-07-17 NOTE — Telephone Encounter (Signed)
05/14/10, pt informed

## 2012-07-17 NOTE — Telephone Encounter (Signed)
Pt called and needs to know if and when she rcvd the whooping cough vax/tdap vax? Pls call.

## 2012-10-25 DIAGNOSIS — T754XXA Electrocution, initial encounter: Secondary | ICD-10-CM

## 2012-10-25 HISTORY — DX: Electrocution, initial encounter: T75.4XXA

## 2013-01-01 ENCOUNTER — Other Ambulatory Visit: Payer: Self-pay | Admitting: Internal Medicine

## 2013-05-01 ENCOUNTER — Other Ambulatory Visit: Payer: Self-pay | Admitting: Internal Medicine

## 2013-05-16 ENCOUNTER — Other Ambulatory Visit: Payer: Self-pay | Admitting: Dermatology

## 2013-07-09 ENCOUNTER — Other Ambulatory Visit: Payer: Self-pay | Admitting: Internal Medicine

## 2013-07-10 ENCOUNTER — Telehealth: Payer: Self-pay | Admitting: Internal Medicine
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
## 2013-07-10 NOTE — Telephone Encounter (Signed)
No ov since 9- 2013 with none in future/please advise
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
## 2013-07-11 ENCOUNTER — Ambulatory Visit: Payer: Self-pay | Admitting: Family Medicine

## 2013-07-12 ENCOUNTER — Other Ambulatory Visit: Payer: Self-pay | Admitting: Internal Medicine

## 2013-08-01 ENCOUNTER — Telehealth: Payer: Self-pay | Admitting: Internal Medicine
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
## 2013-08-01 NOTE — Telephone Encounter (Signed)
Do you want to work her in? 

## 2013-08-02 MED ORDER — FLUTICASONE PROPIONATE 50 MCG/ACT NA SUSP: 2.0000 | Freq: Every day | NASAL | Status: DC | PRN

## 2013-08-02 NOTE — Telephone Encounter (Signed)
Refilled nasal spray, but the cream is not on med list so we can't fill that. (has never been on med list)

## 2013-08-02 NOTE — Telephone Encounter (Signed)
[facility]

## 2013-08-02 NOTE — Telephone Encounter (Signed)
Pt aware, thank you.

## 2013-08-02 NOTE — Telephone Encounter (Signed)
I gave the patient all of the information for [facility] prior to her leaving that day. Should I call and make her aware that we will not be refilling these medications, and she will need to have that completed there as well? Thank you.

## 2013-09-05 ENCOUNTER — Ambulatory Visit: Payer: Self-pay

## 2013-09-05 ENCOUNTER — Ambulatory Visit: Payer: No Typology Code available for payment source | Attending: Internal Medicine | Admitting: Internal Medicine

## 2013-09-05 VITALS — BP 124/89 | HR 70 | Temp 98.1°F | Resp 17 | Ht 68.0 in | Wt 183.0 lb

## 2013-09-05 DIAGNOSIS — R21 Rash and other nonspecific skin eruption: Secondary | ICD-10-CM

## 2013-09-05 DIAGNOSIS — D239 Other benign neoplasm of skin, unspecified: Secondary | ICD-10-CM

## 2013-09-05 DIAGNOSIS — F411 Generalized anxiety disorder: Secondary | ICD-10-CM | POA: Insufficient documentation

## 2013-09-05 LAB — COMPLETE METABOLIC PANEL WITH GFR
ALT: 21 U/L (ref 0–35)
AST: 24 U/L (ref 0–37)
Alkaline Phosphatase: 54 U/L (ref 39–117)
BUN: 13 mg/dL (ref 6–23)
CO2: 27 mEq/L (ref 19–32)
Creat: 0.62 mg/dL (ref 0.50–1.10)
GFR, Est African American: >89 mL/min
GFR, Est Non African American: >89 mL/min
Glucose, Bld: 79 mg/dL (ref 70–99)
Potassium: 4.3 mEq/L (ref 3.5–5.3)
Total Bilirubin: 0.6 mg/dL (ref 0.3–1.2)

## 2013-09-05 LAB — CBC
HCT: 39.9 % (ref 36.0–46.0)
MCH: 30.4 pg (ref 26.0–34.0)
MCHC: 33.3 g/dL (ref 30.0–36.0)
MCV: 91.1 fL (ref 78.0–100.0)
Platelets: 335 10*3/uL (ref 150–400)
RBC: 4.38 MIL/uL (ref 3.87–5.11)
RDW: 13.6 % (ref 11.5–15.5)
WBC: 5.3 10*3/uL (ref 4.0–10.5)

## 2013-09-05 LAB — LIPID PANEL
Cholesterol: 204 mg/dL — ABNORMAL HIGH (ref 0–200)
HDL: 73 mg/dL (ref >39)
Total CHOL/HDL Ratio: 2.8 Ratio
Triglycerides: 70 mg/dL (ref <150)
VLDL: 14 mg/dL (ref 0–40)

## 2013-09-05 MED ORDER — ALPRAZOLAM 0.25 MG PO TABS: 0.2500 mg | ORAL_TABLET | Freq: Every evening | ORAL | Status: DC | PRN

## 2013-09-05 MED ORDER — TRIAMCINOLONE ACETONIDE 0.5 % EX CREA: TOPICAL_CREAM | Freq: Two times a day (BID) | CUTANEOUS | Status: DC

## 2013-09-05 MED ORDER — FLUTICASONE PROPIONATE 50 MCG/ACT NA SUSP: 2.0000 | Freq: Every day | NASAL | Status: DC | PRN

## 2013-09-05 NOTE — Progress Notes (Signed)
Patient ID: [name], female   DOB: 06-Jun-1961, 53 y.o.   MRN: [id number]  Patient Demographics  [name], is a 52 y.o. female  CSN: [id number]  MRN: [id number]  DOB - [birth year]  Outpatient Primary MD for the patient is Judie Petit, MD   With History of -  Past Medical History  Diagnosis Date  . Allergy   . Anemia   . GERD (gastroesophageal reflux disease)       Past Surgical History  Procedure Laterality Date  . Ovarian cyst removal  1995    dr Rosemary Holms    in for   Chief Complaint  Patient presents with  . new patient     HPI  [name]  is a 52 y.o. female, extremely healthy for her age with only ongoing medical problems of intermittent anxiety for which she uses as needed Xanax once or twice a month, who is following with [facility] and now wants to transfer her care to this clinic due to lack of insurance, she recently had a skin biopsy done in her left mid back region by dermatologist in town Dr.Lupton.  Here to establish care. She currently has no subjective complaints.    Review of Systems    In addition to the HPI above, No Fever-chills, No Headache, No changes with Vision or hearing, No problems swallowing food or Liquids, No Chest pain, Cough or Shortness of Breath, No Abdominal pain, No Nausea or Vommitting, Bowel movements are regular, No Blood in stool or Urine, No dysuria, No new skin rashes or bruises, No new joints pains-aches,  No new weakness, tingling, numbness in any extremity, No recent weight gain or loss, No polyuria, polydypsia or polyphagia, No significant Mental Stressors.  A full 10 point Review of Systems was done, except as stated above, all other Review of Systems were negative.   Social History History  Substance Use Topics  . Smoking status: Never Smoker   . Smokeless tobacco: Never Used  . Alcohol Use: No     Family History Family History  Problem Relation Age of  Onset  . Heart disease Mother   . Cancer Father 72    lung ca  . Heart disease Father 11    mi/cabg  . Colon cancer Cousin 30     Prior to Admission medications   Medication Sig Start Date End Date Taking? Authorizing Provider  ALPRAZolam (XANAX) 0.25 MG tablet TAKE 1 TABLET BY MOUTH AT BEDTIME 07/12/13  Yes Bruce Romilda Garret, MD  ascorbic acid (VITAMIN C) 500 MG tablet Take 500 mg by mouth 2 (two) times daily.   Yes Historical Provider, MD  Cyanocobalamin (B-12) 1000 MCG LOZG Take by mouth every other day.   Yes Historical Provider, MD  Ferrous Fumarate-Vitamin C (VITRON-C PO) Take by mouth daily.   Yes Historical Provider, MD  fluticasone (FLONASE) 50 MCG/ACT nasal spray Place 2 sprays into the nose daily as needed. 08/02/13  Yes Lindley Magnus, MD  Magnesium 400 MG CAPS Take by mouth at bedtime.   Yes Historical Provider, MD  phenylephrine (NEO-SYNEPHRINE) 1 % nasal spray Place 1 drop into the nose at bedtime.   Yes Historical Provider, MD  Probiotic Product (PROBIOTIC FORMULA PO) Take by mouth.   Yes Historical Provider, MD  triamcinolone (KENALOG) 0.5 % cream Apply topically 2 (two) times daily. As needed 12/31/10  Yes Kristian Covey, MD  aspirin 81 MG tablet Take 81 mg by mouth daily. Takes 2 tablets  daily    Historical Provider, MD    Allergies  Allergen Reactions  . Sausage [Pickled Meat]   . Sulfamethoxazole     flushing    Physical Exam  Vitals  Blood pressure 124/89, pulse 70, temperature 98.1 F (36.7 C), resp. rate 17, height 5\' 8"  (1.727 m), weight 183 lb (83.008 kg), SpO2 100.00%.   1. General middle-aged Caucasian female sitting on clinic examination table in no apparent distress,   2. Normal affect and insight, Not Suicidal or Homicidal, Awake Alert, Oriented X 3.  3. No F.N deficits, ALL C.Nerves Intact, Strength 5/5 all 4 extremities, Sensation intact all 4 extremities, Plantars down going.  4. Ears and Eyes appear Normal, Conjunctivae clear, PERRLA. Moist  Oral Mucosa.  5. Supple Neck, No JVD, No cervical lymphadenopathy appriciated, No Carotid Bruits.  6. Symmetrical Chest wall movement, Good air movement bilaterally, CTAB.  7. RRR, No Gallops, Rubs or Murmurs, No Parasternal Heave.  8. Positive Bowel Sounds, Abdomen Soft, Non tender, No organomegaly appriciated,No rebound -guarding or rigidity.  9.  No Cyanosis, Normal Skin Turgor, No Skin Rash or Bruise.  10. Good muscle tone,  joints appear normal , no effusions, Normal ROM.  11. No Palpable Lymph Nodes in Neck or Axillae    Data Review  Lab Results  Component Value Date   WBC 6.0 06/05/2012   HGB 12.1 06/05/2012   HCT 37.3 06/05/2012   MCV 93.9 06/05/2012   PLT 249.0 06/05/2012      Chemistry      Component Value Date/Time   NA 136 06/05/2012 0959   K 4.0 06/05/2012 0959   CL 102 06/05/2012 0959   CO2 28 06/05/2012 0959   BUN 16 06/05/2012 0959   CREATININE 0.6 06/05/2012 0959      Component Value Date/Time   CALCIUM 8.6 06/05/2012 0959   ALKPHOS 43 06/05/2012 0959   AST 23 06/05/2012 0959   ALT 18 06/05/2012 0959   BILITOT 0.8 06/05/2012 0959       No results found for this basename: HGBA1C    Lab Results  Component Value Date   CHOL 176 06/05/2012   HDL 72.40 06/05/2012   LDLCALC 91 06/05/2012   TRIG 63.0 06/05/2012   CHOLHDL 2 06/05/2012    Lab Results  Component Value Date   TSH 2.54 06/05/2012    No results found for this basename: PSA         Assessment and plan  Mild anxiety. In good control, Xanax on as needed basis which she uses once or twice a month.    Recent skin biopsy in the left mid back showing dysplastic nevus. Referred to dermatology.     Routine health maintenance.  She had a normal colonoscopy one year ago  Had flu shot this year  Has been referred to [facility] for mammogram and Pap smear    Baseline A1c has been ordered,  baseline CBC, CMP, TSH, Lipid Panel ordered    Leroy Sea M.D on 09/05/2013 at 10:28 AM

## 2013-09-05 NOTE — Progress Notes (Signed)
Patient here to establish care Physical and medication refill

## 2013-09-06 ENCOUNTER — Telehealth: Payer: Self-pay

## 2013-09-06 NOTE — Telephone Encounter (Signed)
Patient is aware of her lab results 

## 2013-09-06 NOTE — Progress Notes (Signed)
Quick Note:  Please inform patient to go on a heart healthy diet, 30 minutes of aerobic exercise 5 times a week. Repeat fasting lipid panel in 3 months. She has mildly elevated cholesterol ______

## 2013-09-06 NOTE — Telephone Encounter (Signed)
Message copied by Lestine Mount on Thu Sep 06, 2013 10:05 AM ------      Message from: [facility], Bess Harvest K      Created: Thu Sep 06, 2013  8:54 AM       Please inform patient to go on a heart healthy diet, 30 minutes of aerobic exercise 5 times a week. Repeat fasting lipid panel in 3 months. She has mildly elevated cholesterol ------

## 2013-10-04 ENCOUNTER — Ambulatory Visit: Payer: Self-pay

## 2013-10-22 ENCOUNTER — Ambulatory Visit: Payer: Self-pay

## 2013-11-19 ENCOUNTER — Ambulatory Visit: Payer: No Typology Code available for payment source | Attending: Internal Medicine

## 2013-11-19 ENCOUNTER — Ambulatory Visit: Payer: No Typology Code available for payment source

## 2013-11-22 ENCOUNTER — Ambulatory Visit: Payer: Self-pay | Admitting: Family Medicine

## 2013-11-22 ENCOUNTER — Ambulatory Visit: Payer: No Typology Code available for payment source | Admitting: Internal Medicine

## 2013-11-22 VITALS — BP 110/72 | HR 95 | Temp 98.5°F | Resp 18 | Ht 68.0 in | Wt 180.0 lb

## 2013-11-22 DIAGNOSIS — R509 Fever, unspecified: Secondary | ICD-10-CM

## 2013-11-22 DIAGNOSIS — R059 Cough, unspecified: Secondary | ICD-10-CM

## 2013-11-22 DIAGNOSIS — R05 Cough: Secondary | ICD-10-CM

## 2013-11-22 LAB — POCT CBC
Granulocyte percent: 79.2 %G (ref 37–80)
HCT, POC: 40.6 % (ref 37.7–47.9)
HEMOGLOBIN: 12.2 g/dL (ref 12.2–16.2)
LYMPH, POC: 1.3 (ref 0.6–3.4)
MCH: 29.3 pg (ref 27–31.2)
MCHC: 30 g/dL — AB (ref 31.8–35.4)
MCV: 97.6 fL — AB (ref 80–97)
MID (cbc): 0.7 (ref 0–0.9)
MPV: 8.7 fL (ref 0–99.8)
POC Granulocyte: 7.6 — AB (ref 2–6.9)
POC LYMPH PERCENT: 13.8 %L (ref 10–50)
POC MID %: 7 % (ref 0–12)
Platelet Count, POC: 257 10*3/uL (ref 142–424)
RBC: 4.16 M/uL (ref 4.04–5.48)
RDW, POC: 13.4 %
WBC: 9.6 10*3/uL (ref 4.6–10.2)

## 2013-11-22 MED ORDER — DOXYCYCLINE HYCLATE 100 MG PO CAPS: 100.0000 mg | ORAL_CAPSULE | Freq: Two times a day (BID) | ORAL | Status: DC

## 2013-11-22 NOTE — Progress Notes (Signed)
[facility] [address], [place] [address] [phone]  Date:  11/22/2013   Name:  [name]   DOB:  [birth year]   MRN:  [id number]  PCP:  Lorayne Marek, MD    Chief Complaint: Chills, Fever, chest congestion, Headache, Generalized Body Aches and Cough   History of Present Illness:  [name] is a 53 y.o. very pleasant female patient who presents with the following:  Here today with illness.  She has been ill for 4 days.  She first noted extreme fatigue, aches. Chills. She notes a tight and congested chest. This am her temp was 101.  She thinks that she might have the flu- one of her co- workers did recently.   She has been taking ibuprofen so she is not sure what her temp has bene otherwise.   She has noted mild diarrhea but she also has her period and often has diarrhea with this.    She is generally healthy  Patient Active Problem List   Diagnosis Date Noted  . OTHER AND UNSPECIFIED OVARIAN CYST 09/08/2010  . GERD 08/10/2010  . ANXIETY STATE NOS 02/02/2010    Past Medical History  Diagnosis Date  . Allergy   . Anemia   . GERD (gastroesophageal reflux disease)     Past Surgical History  Procedure Laterality Date  . Ovarian cyst removal  1995    dr Edwyna Ready    History  Substance Use Topics  . Smoking status: Never Smoker   . Smokeless tobacco: Never Used  . Alcohol Use: No    Family History  Problem Relation Age of Onset  . Heart disease Mother   . Cancer Father 58    lung ca  . Heart disease Father 29    mi/cabg  . Colon cancer Cousin 30    Allergies  Allergen Reactions  . Sausage [Pickled Meat]   . Sulfamethoxazole     flushing    Medication list has been reviewed and updated.  Current Outpatient Prescriptions on File Prior to Visit  Medication Sig Dispense Refill  . ALPRAZolam (XANAX) 0.25 MG tablet Take 1 tablet (0.25 mg total) by mouth at bedtime as needed for anxiety.  30 tablet  0  . ascorbic  acid (VITAMIN C) 500 MG tablet Take 500 mg by mouth 2 (two) times daily.      . Cyanocobalamin (B-12) 1000 MCG LOZG Take by mouth every other day.      . Ferrous Fumarate-Vitamin C (VITRON-C PO) Take by mouth daily.      . fluticasone (FLONASE) 50 MCG/ACT nasal spray Place 2 sprays into both nostrils daily as needed.  16 g  2  . Magnesium 400 MG CAPS Take by mouth at bedtime.      . phenylephrine (NEO-SYNEPHRINE) 1 % nasal spray Place 1 drop into the nose at bedtime.      . Probiotic Product (PROBIOTIC FORMULA PO) Take by mouth.      . triamcinolone cream (KENALOG) 0.5 % Apply topically 2 (two) times daily. As needed  30 g  3  . aspirin 81 MG tablet Take 81 mg by mouth daily. Takes 2 tablets daily       No current facility-administered medications on file prior to visit.    Review of Systems:  As per HPI- otherwise negative.   Physical Examination: Filed Vitals:   11/22/13 1247  BP: 110/72  Pulse: 95  Temp: 98.5 F (36.9 C)  Resp:  [place]:   11/22/13 1247  Height: 5\' 8"  (1.727 m)  Weight: 180 lb (81.647 kg)   Body mass index is 27.38 kg/(m^2). Ideal Body Weight: Weight in (lb) to have BMI = 25: 164.1  GEN: WDWN, NAD, Non-toxic, A & O x 3 HEENT: Atraumatic, Normocephalic. Neck supple. No masses, No LAD.  Bilateral TM wnl, oropharynx normal.  PEERL,EOMI.   Ears and Nose: No external deformity. CV: RRR, No M/G/R. No JVD. No thrill. No extra heart sounds. PULM: CTA B, no wheezes, crackles, rhonchi. No retractions. No resp. distress. No accessory muscle use. ABD: S, NT, ND, +BS. No rebound. No HSM. EXTR: No c/c/e NEURO Normal gait.  PSYCH: Normally interactive. Conversant. Not depressed or anxious appearing.  Calm demeanor.    Results for orders placed in visit on 11/22/13  POCT CBC      Result Value Range   WBC 9.6  4.6 - 10.2 K/uL   Lymph, poc 1.3  0.6 - 3.4   POC LYMPH PERCENT 13.8  10 - 50 %L   MID (cbc) 0.7  0 - 0.9   POC MID % 7.0  0 - 12 %M   POC  Granulocyte 7.6 (*) 2 - 6.9   Granulocyte percent 79.2  37 - 80 %G   RBC 4.16  4.04 - 5.48 M/uL   Hemoglobin 12.2  12.2 - 16.2 g/dL   HCT, POC 40.6  37.7 - 47.9 %   MCV 97.6 (*) 80 - 97 fL   MCH, POC 29.3  27 - 31.2 pg   MCHC 30.0 (*) 31.8 - 35.4 g/dL   RDW, POC 13.4     Platelet Count, POC 257  142 - 424 K/uL   MPV 8.7  0 - 99.8 fL    Assessment and Plan: Fever, unspecified - Plan: POCT CBC, doxycycline (VIBRAMYCIN) 100 MG capsule  Cough  Recent illness- possible flu but she is out of the tamiflu window.  She is worried about a respiratory infection so we will treat with doxycyline.  She will let me know if not better in the next few days- Sooner if worse.     Signed Lamar Blinks, MD

## 2013-11-22 NOTE — Patient Instructions (Signed)
Continue to take OTC medication as needed.  Use the doxycycline as directed, rest and drink plenty of fluids.

## 2013-11-23 ENCOUNTER — Telehealth: Payer: Self-pay

## 2013-11-23 NOTE — Telephone Encounter (Signed)
Dr offered patient initially declined - now would like tamaflu  [facility] [address] [phone]  [phone]

## 2013-11-23 NOTE — Telephone Encounter (Signed)
Patient called again wanting the Tamiflu. I spoke with [name], he felt it was not necessary for her to take the Tamiflu. I advised her to continue the Motrin and Robitussin and Doxycycline. If she continues to get worse she should RTC for Recheck.

## 2013-11-27 ENCOUNTER — Telehealth: Payer: Self-pay

## 2013-11-27 NOTE — Telephone Encounter (Signed)
Patient states that she was seen last week and prescribed Doxycycline. States that her nurse friend listened to her lungs and thinks she may have bronchitis. Patient wants another antibiotic called in if possible. [facility] [address]  [phone]

## 2013-11-28 ENCOUNTER — Emergency Department (HOSPITAL_COMMUNITY)
Admission: EM | Admit: 2013-11-28 | Discharge: 2013-11-28 | Disposition: A | Discharge status: Home / Self Care | Payer: No Typology Code available for payment source | Source: Home / Self Care

## 2013-11-28 ENCOUNTER — Encounter (HOSPITAL_COMMUNITY): Payer: Self-pay | Admitting: Emergency Medicine

## 2013-11-28 DIAGNOSIS — R059 Cough, unspecified: Secondary | ICD-10-CM

## 2013-11-28 DIAGNOSIS — R0982 Postnasal drip: Secondary | ICD-10-CM

## 2013-11-28 DIAGNOSIS — R05 Cough: Secondary | ICD-10-CM

## 2013-11-28 NOTE — ED Notes (Signed)
C/o chest congestion not improving as quickly as she would like; "I want to be sure I am not going into pneumonia"

## 2013-11-28 NOTE — ED Provider Notes (Signed)
CSN: [id number]     Arrival date & time 11/28/13  1253 History   First MD Initiated Contact with Patient 11/28/13 1433     Chief Complaint  Patient presents with  . Cough   (Consider location/radiation/quality/duration/timing/severity/associated sxs/prior Treatment) HPI Comments: As above. PT 53-year-old female was diagnosed with influenza 7 days ago. Most of her symptoms have significantly improved. She is still tired and not back to her percent. She feels as though that she has congestion in the anterior chest and is concerned that this may be reflecting a pneumonia. She denies increasing cough or productive cough, fever or shortness of breath. She does have mild dyspnea with exertion.   Past Medical History  Diagnosis Date  . Allergy   . Anemia   . GERD (gastroesophageal reflux disease)    Past Surgical History  Procedure Laterality Date  . Ovarian cyst removal  1995    dr Edwyna Ready   Family History  Problem Relation Age of Onset  . Heart disease Mother   . Cancer Father 30    lung ca  . Heart disease Father 19    mi/cabg  . Colon cancer Cousin 30   History  Substance Use Topics  . Smoking status: Never Smoker   . Smokeless tobacco: Never Used  . Alcohol Use: No   OB History   Grav Para Term Preterm Abortions TAB SAB Ect Mult Living                 Review of Systems  Constitutional: Positive for activity change. Negative for fever.  HENT: Positive for congestion. Negative for ear discharge, postnasal drip, rhinorrhea and sore throat.   Respiratory: Positive for cough. Negative for wheezing.   Gastrointestinal: Negative.   Musculoskeletal: Negative.   Skin: Negative.   Neurological: Negative.     Allergies  Sausage and Sulfamethoxazole  Home Medications   Current Outpatient Rx  Name  Route  Sig  Dispense  Refill  . ALPRAZolam (XANAX) 0.25 MG tablet   Oral   Take 1 tablet (0.25 mg total) by mouth at bedtime as needed for anxiety.   30 tablet   0   .  ascorbic acid (VITAMIN C) 500 MG tablet   Oral   Take 500 mg by mouth 2 (two) times daily.         Marland Kitchen aspirin 81 MG tablet   Oral   Take 81 mg by mouth daily. Takes 2 tablets daily         . Cyanocobalamin (B-12) 1000 MCG LOZG   Oral   Take by mouth every other day.         Marland Kitchen doxycycline (VIBRAMYCIN) 100 MG capsule   Oral   Take 1 capsule (100 mg total) by mouth 2 (two) times daily.   20 capsule   0   . Ferrous Fumarate-Vitamin C (VITRON-C PO)   Oral   Take by mouth daily.         Marland Kitchen Fexofenadine HCl (MUCINEX ALLERGY PO)   Oral   Take by mouth.         . fluticasone (FLONASE) 50 MCG/ACT nasal spray   Each Nare   Place 2 sprays into both nostrils daily as needed.   16 g   2   . Magnesium 400 MG CAPS   Oral   Take by mouth at bedtime.         . phenylephrine (NEO-SYNEPHRINE) 1 % nasal spray   Nasal   Place  1 drop into the nose at bedtime.         . Probiotic Product (PROBIOTIC FORMULA PO)   Oral   Take by mouth.         . triamcinolone cream (KENALOG) 0.5 %   Topical   Apply topically 2 (two) times daily. As needed   30 g   3    BP 131/84  Pulse 66  Temp(Src) 98.6 F (37 C) (Oral)  Resp 17  SpO2 99%  LMP 11/21/2013 Physical Exam  Nursing note and vitals reviewed. Constitutional: She is oriented to person, place, and time. She appears well-developed and well-nourished. No distress.  HENT:  Mouth/Throat: No oropharyngeal exudate.  Oropharynx with minor erythema and clear PND. Bilateral palatine tonsils are enlarged but the patient states that her normal anatomy. No exudates or signs of inflammation.  Eyes: Conjunctivae and EOM are normal.  Neck: Normal range of motion. Neck supple.  Cardiovascular: Normal rate, regular rhythm and normal heart sounds.   No murmur heard. Pulmonary/Chest: Effort normal and breath sounds normal. No respiratory distress. She has no rales.  Good air movement and chest expansion. The patient was able to take  several deep breaths without cough or difficulty. Cough does not produce wheeze or coarseness. Twice, there was a faint distant inspiratory wheeze in the right lower field. Anterior chest auscultation reveals no wheezing or other adventitious lung sounds.  Lymphadenopathy:    She has no cervical adenopathy.  Neurological: She is alert and oriented to person, place, and time.  Skin: Skin is warm and dry. No erythema.    ED Course  Procedures (including critical care time) Labs Review Labs Reviewed - No data to display Imaging Review No results found.    MDM   1. Cough   2. PND (post-nasal drip)      Add Allegra 180 mg q d Stop Mucinex and continue the Robitussin DM only For any worsening such as shortness of breath at rest or increasing on exertion, increasing cough or fever sick medical attention promptly. The patient and I discussed her condition rather thoroughly. She is comfortable with waiting for a chest x-ray at this time. Since her cough has improved, but has no fever and only has minor dyspnea on exertion one week after having the flu, the lungs are clear that if she gets worse or develops any of these symptoms she may return promptly for additional assessment and management.  Janne Napoleon, NP 11/28/13 1459

## 2013-11-28 NOTE — Telephone Encounter (Signed)
RTC

## 2013-11-28 NOTE — Discharge Instructions (Signed)
Cough, Adult  A cough is a reflex that helps clear your throat and airways. It can help heal the body or may be a reaction to an irritated airway. A cough may only last 2 or 3 weeks (acute) or may last more than 8 weeks (chronic).  CAUSES Acute cough:  Viral or bacterial infections. Chronic cough:  Infections.  Allergies.  Asthma.  Post-nasal drip.  Smoking.  Heartburn or acid reflux.  Some medicines.  Chronic lung problems (COPD).  Cancer. SYMPTOMS   Cough.  Fever.  Chest pain.  Increased breathing rate.  High-pitched whistling sound when breathing (wheezing).  Colored mucus that you cough up (sputum). TREATMENT   A bacterial cough may be treated with antibiotic medicine.  A viral cough must run its course and will not respond to antibiotics.  Your caregiver may recommend other treatments if you have a chronic cough. HOME CARE INSTRUCTIONS   Only take over-the-counter or prescription medicines for pain, discomfort, or fever as directed by your caregiver. Use cough suppressants only as directed by your caregiver.  Use a cold steam vaporizer or humidifier in your bedroom or home to help loosen secretions.  Sleep in a semi-upright position if your cough is worse at night.  Rest as needed.  Stop smoking if you smoke. SEEK IMMEDIATE MEDICAL CARE IF:   You have pus in your sputum.  Your cough starts to worsen.  You cannot control your cough with suppressants and are losing sleep.  You begin coughing up blood.  You have difficulty breathing.  You develop pain which is getting worse or is uncontrolled with medicine.  You have a fever. MAKE SURE YOU:   Understand these instructions.  Will watch your condition.  Will get help right away if you are not doing well or get worse. Document Released: 04/09/2011 Document Revised: 01/03/2012 Document Reviewed: 04/09/2011 [facility] Patient Information 2014 [place].  Fever, Adult A fever is a  higher than normal body temperature. In an adult, an oral temperature around 98.6 F (37 C) is considered normal. A temperature of 100.4 F (38 C) or higher is generally considered a fever. Mild or moderate fevers generally have no long-term effects and often do not require treatment. Extreme fever (greater than or equal to 106 F or 41.1 C) can cause seizures. The sweating that may occur with repeated or prolonged fever may cause dehydration. Elderly people can develop confusion during a fever. A measured temperature can vary with:  Age.  Time of day.  Method of measurement (mouth, underarm, rectal, or ear). The fever is confirmed by taking a temperature with a thermometer. Temperatures can be taken different ways. Some methods are accurate and some are not.  An oral temperature is used most commonly. Electronic thermometers are fast and accurate.  An ear temperature will only be accurate if the thermometer is positioned as recommended by the manufacturer.  A rectal temperature is accurate and done for those adults who have a condition where an oral temperature cannot be taken.  An underarm (axillary) temperature is not accurate and not recommended. Fever is a symptom, not a disease.  CAUSES   Infections commonly cause fever.  Some noninfectious causes for fever include:  Some arthritis conditions.  Some thyroid or adrenal gland conditions.  Some immune system conditions.  Some types of cancer.  A medicine reaction.  High doses of certain street drugs such as methamphetamine.  Dehydration.  Exposure to high outside or room temperatures.  Occasionally, the source of  a fever cannot be determined. This is sometimes called a "fever of unknown origin" (FUO).  Some situations may lead to a temporary rise in body temperature that may go away on its own. Examples are:  Childbirth.  Surgery.  Intense exercise. HOME CARE INSTRUCTIONS   Take appropriate medicines for  fever. Follow dosing instructions carefully. If you use acetaminophen to reduce the fever, be careful to avoid taking other medicines that also contain acetaminophen. Do not take aspirin for a fever if you are younger than age 68. There is an association with Reye's syndrome. Reye's syndrome is a rare but potentially deadly disease.  If an infection is present and antibiotics have been prescribed, take them as directed. Finish them even if you start to feel better.  Rest as needed.  Maintain an adequate fluid intake. To prevent dehydration during an illness with prolonged or recurrent fever, you may need to drink extra fluid.Drink enough fluids to keep your urine clear or pale yellow.  Sponging or bathing with room temperature water may help reduce body temperature. Do not use ice water or alcohol sponge baths.  Dress comfortably, but do not over-bundle. SEEK MEDICAL CARE IF:   You are unable to keep fluids down.  You develop vomiting or diarrhea.  You are not feeling at least partly better after 3 days.  You develop new symptoms or problems. SEEK IMMEDIATE MEDICAL CARE IF:   You have shortness of breath or trouble breathing.  You develop excessive weakness.  You are dizzy or you faint.  You are extremely thirsty or you are making little or no urine.  You develop new pain that was not there before (such as in the head, neck, chest, back, or abdomen).  You have persistant vomiting and diarrhea for more than 1 to 2 days.  You develop a stiff neck or your eyes become sensitive to light.  You develop a skin rash.  You have a fever or persistent symptoms for more than 2 to 3 days.  You have a fever and your symptoms suddenly get worse. MAKE SURE YOU:   Understand these instructions.  Will watch your condition.  Will get help right away if you are not doing well or get worse. Document Released: 04/06/2001 Document Revised: 01/03/2012 Document Reviewed: 08/12/2011 [facility]  Patient Information 2014 [place], [place].  Upper Respiratory Infection, Adult An upper respiratory infection (URI) is also known as the common cold. It is often caused by a type of germ (virus). Colds are easily spread (contagious). You can pass it to others by kissing, coughing, sneezing, or drinking out of the same glass. Usually, you get better in 1 or 2 weeks.  HOME CARE   Only take medicine as told by your doctor.  Use a warm mist humidifier or breathe in steam from a hot shower.  Drink enough water and fluids to keep your pee (urine) clear or pale yellow.  Get plenty of rest.  Return to work when your temperature is back to normal or as told by your doctor. You may use a face mask and wash your hands to stop your cold from spreading. GET HELP RIGHT AWAY IF:   After the first few days, you feel you are getting worse.  You have questions about your medicine.  You have chills, shortness of breath, or brown or red spit (mucus).  You have yellow or brown snot (nasal discharge) or pain in the face, especially when you bend forward.  You have a fever, puffy (  swollen) neck, pain when you swallow, or white spots in the back of your throat.  You have a bad headache, ear pain, sinus pain, or chest pain.  You have a high-pitched whistling sound when you breathe in and out (wheezing).  You have a lasting cough or cough up blood.  You have sore muscles or a stiff neck. MAKE SURE YOU:   Understand these instructions.  Will watch your condition.  Will get help right away if you are not doing well or get worse. Document Released: 03/29/2008 Document Revised: 01/03/2012 Document Reviewed: 02/15/2011 [facility] Patient Information 2014 [place], [place].

## 2013-11-29 NOTE — Telephone Encounter (Signed)
Looks like pt was seen in ED yesterday. Follow up for any other concerns

## 2013-12-03 ENCOUNTER — Encounter: Payer: Self-pay | Admitting: Obstetrics & Gynecology

## 2013-12-05 NOTE — ED Provider Notes (Signed)
Medical screening examination/treatment/procedure(s) were performed by resident physician or non-physician practitioner and as supervising physician I was immediately available for consultation/collaboration.   Pauline Good MD.   Billy Fischer, MD 12/05/13 [phone]

## 2013-12-07 ENCOUNTER — Other Ambulatory Visit: Payer: Self-pay | Admitting: Internal Medicine

## 2013-12-07 DIAGNOSIS — Z1231 Encounter for screening mammogram for malignant neoplasm of breast: Secondary | ICD-10-CM

## 2013-12-11 ENCOUNTER — Ambulatory Visit (HOSPITAL_COMMUNITY)
Admission: RE | Admit: 2013-12-11 | Discharge: 2013-12-11 | Disposition: A | Discharge status: Home / Self Care | Payer: No Typology Code available for payment source | Source: Ambulatory Visit | Attending: Internal Medicine | Admitting: Internal Medicine

## 2013-12-11 DIAGNOSIS — Z1231 Encounter for screening mammogram for malignant neoplasm of breast: Secondary | ICD-10-CM | POA: Insufficient documentation

## 2014-01-09 ENCOUNTER — Encounter (HOSPITAL_COMMUNITY): Payer: Self-pay | Admitting: Emergency Medicine

## 2014-01-09 ENCOUNTER — Emergency Department (HOSPITAL_COMMUNITY): Payer: No Typology Code available for payment source

## 2014-01-09 ENCOUNTER — Telehealth: Payer: Self-pay | Admitting: Internal Medicine

## 2014-01-09 ENCOUNTER — Telehealth (HOSPITAL_COMMUNITY): Payer: Self-pay | Admitting: *Deleted

## 2014-01-09 ENCOUNTER — Observation Stay (HOSPITAL_COMMUNITY)
Admission: EM | Admit: 2014-01-09 | Discharge: 2014-01-09 | Disposition: A | Discharge status: Home / Self Care | Payer: No Typology Code available for payment source | Attending: Internal Medicine | Admitting: Internal Medicine

## 2014-01-09 DIAGNOSIS — K219 Gastro-esophageal reflux disease without esophagitis: Secondary | ICD-10-CM

## 2014-01-09 DIAGNOSIS — Z8679 Personal history of other diseases of the circulatory system: Secondary | ICD-10-CM | POA: Insufficient documentation

## 2014-01-09 DIAGNOSIS — Z8719 Personal history of other diseases of the digestive system: Secondary | ICD-10-CM | POA: Insufficient documentation

## 2014-01-09 DIAGNOSIS — F411 Generalized anxiety disorder: Secondary | ICD-10-CM

## 2014-01-09 DIAGNOSIS — N83209 Unspecified ovarian cyst, unspecified side: Secondary | ICD-10-CM

## 2014-01-09 DIAGNOSIS — I4891 Unspecified atrial fibrillation: Principal | ICD-10-CM

## 2014-01-09 DIAGNOSIS — R000 Tachycardia, unspecified: Secondary | ICD-10-CM | POA: Insufficient documentation

## 2014-01-09 DIAGNOSIS — R079 Chest pain, unspecified: Secondary | ICD-10-CM

## 2014-01-09 DIAGNOSIS — IMO0002 Reserved for concepts with insufficient information to code with codable children: Secondary | ICD-10-CM | POA: Insufficient documentation

## 2014-01-09 DIAGNOSIS — Z8619 Personal history of other infectious and parasitic diseases: Secondary | ICD-10-CM | POA: Insufficient documentation

## 2014-01-09 DIAGNOSIS — Z79899 Other long term (current) drug therapy: Secondary | ICD-10-CM | POA: Insufficient documentation

## 2014-01-09 DIAGNOSIS — D649 Anemia, unspecified: Secondary | ICD-10-CM | POA: Insufficient documentation

## 2014-01-09 HISTORY — DX: Migraine, unspecified, not intractable, without status migrainosus: G43.909

## 2014-01-09 HISTORY — DX: Zoster without complications: B02.9

## 2014-01-09 LAB — COMPREHENSIVE METABOLIC PANEL
ALBUMIN: 3.4 g/dL — AB (ref 3.5–5.2)
ALK PHOS: 57 U/L (ref 39–117)
ALT: 16 U/L (ref 0–35)
ALT: 16 U/L (ref 0–35)
AST: 22 U/L (ref 0–37)
AST: 21 U/L (ref 0–37)
Albumin: 3.6 g/dL (ref 3.5–5.2)
Alkaline Phosphatase: 57 U/L (ref 39–117)
BILIRUBIN TOTAL: 0.3 mg/dL (ref 0.3–1.2)
BUN: 14 mg/dL (ref 6–23)
BUN: 12 mg/dL (ref 6–23)
CALCIUM: 9.5 mg/dL (ref 8.4–10.5)
CO2: 25 meq/L (ref 19–32)
CO2: 25 mEq/L (ref 19–32)
Calcium: 10 mg/dL (ref 8.4–10.5)
Chloride: 102 mEq/L (ref 96–112)
Chloride: 101 mEq/L (ref 96–112)
Creatinine, Ser: 0.55 mg/dL (ref 0.50–1.10)
Creatinine, Ser: 0.55 mg/dL (ref 0.50–1.10)
GFR calc Af Amer: >90 mL/min (ref >90)
GFR calc non Af Amer: >90 mL/min (ref >90)
GFR calc non Af Amer: >90 mL/min (ref >90)
GLUCOSE: 93 mg/dL (ref 70–99)
GLUCOSE: 128 mg/dL — AB (ref 70–99)
POTASSIUM: 3.8 meq/L (ref 3.7–5.3)
POTASSIUM: 4.1 meq/L (ref 3.7–5.3)
SODIUM: 140 meq/L (ref 137–147)
Sodium: 141 mEq/L (ref 137–147)
TOTAL PROTEIN: 7.2 g/dL (ref 6.0–8.3)
Total Bilirubin: 0.4 mg/dL (ref 0.3–1.2)
Total Protein: 7.5 g/dL (ref 6.0–8.3)

## 2014-01-09 LAB — CBC WITH DIFFERENTIAL/PLATELET
Basophils Absolute: 0 10*3/uL (ref 0.0–0.1)
Basophils Absolute: 0 10*3/uL (ref 0.0–0.1)
Basophils Relative: 0 % (ref 0–1)
Basophils Relative: 1 % (ref 0–1)
EOS ABS: 0.4 10*3/uL (ref 0.0–0.7)
EOS PCT: 6 % — AB (ref 0–5)
EOS PCT: 2 % (ref 0–5)
Eosinophils Absolute: 0.1 10*3/uL (ref 0.0–0.7)
HCT: 37.2 % (ref 36.0–46.0)
HCT: 35.7 % — ABNORMAL LOW (ref 36.0–46.0)
HEMOGLOBIN: 11.9 g/dL — AB (ref 12.0–15.0)
Hemoglobin: 12.4 g/dL (ref 12.0–15.0)
LYMPHS ABS: 1.8 10*3/uL (ref 0.7–4.0)
LYMPHS PCT: 29 % (ref 12–46)
LYMPHS PCT: 19 % (ref 12–46)
Lymphs Abs: 1 10*3/uL (ref 0.7–4.0)
MCH: 30 pg (ref 26.0–34.0)
MCH: 30.3 pg (ref 26.0–34.0)
MCHC: 33.3 g/dL (ref 30.0–36.0)
MCHC: 33.3 g/dL (ref 30.0–36.0)
MCV: 90.1 fL (ref 78.0–100.0)
MCV: 90.8 fL (ref 78.0–100.0)
MONOS PCT: 6 % (ref 3–12)
Monocytes Absolute: 0.6 10*3/uL (ref 0.1–1.0)
Monocytes Absolute: 0.4 10*3/uL (ref 0.1–1.0)
Monocytes Relative: 10 % (ref 3–12)
NEUTROS ABS: 4.1 10*3/uL (ref 1.7–7.7)
Neutro Abs: 3.5 10*3/uL (ref 1.7–7.7)
Neutrophils Relative %: 55 % (ref 43–77)
Neutrophils Relative %: 73 % (ref 43–77)
Platelets: 276 10*3/uL (ref 150–400)
Platelets: 250 10*3/uL (ref 150–400)
RBC: 4.13 MIL/uL (ref 3.87–5.11)
RBC: 3.93 MIL/uL (ref 3.87–5.11)
RDW: 13.3 % (ref 11.5–15.5)
RDW: 13.4 % (ref 11.5–15.5)
WBC: 6.3 10*3/uL (ref 4.0–10.5)
WBC: 5.6 10*3/uL (ref 4.0–10.5)

## 2014-01-09 LAB — TSH
TSH: 4.379 u[IU]/mL (ref 0.350–4.500)
TSH: 1.93 u[IU]/mL (ref 0.350–4.500)

## 2014-01-09 LAB — T3, FREE: T3 FREE: 3.3 pg/mL (ref 2.3–4.2)

## 2014-01-09 LAB — TROPONIN I
Troponin I: <0.3 ng/mL (ref <0.30)
Troponin I: <0.3 ng/mL (ref <0.30)

## 2014-01-09 LAB — D-DIMER, QUANTITATIVE: D-Dimer, Quant: <0.27 ug/mL-FEU (ref 0.00–0.48)

## 2014-01-09 LAB — T4, FREE: Free T4: 0.96 ng/dL (ref 0.80–1.80)

## 2014-01-09 MED ORDER — ASPIRIN EC 325 MG PO TBEC
325.0000 mg | DELAYED_RELEASE_TABLET | Freq: Every day | ORAL | Status: DC
Start: 2014-01-09 — End: 2014-01-09
  Filled 2014-01-09: qty 1

## 2014-01-09 MED ORDER — METOPROLOL SUCCINATE 12.5 MG HALF TABLET
12.5000 mg | ORAL_TABLET | Freq: Every day | ORAL | Status: DC
  Filled 2014-01-09: qty 1

## 2014-01-09 MED ORDER — ONDANSETRON HCL 4 MG PO TABS: 4.0000 mg | ORAL_TABLET | Freq: Four times a day (QID) | ORAL | Status: DC | PRN

## 2014-01-09 MED ORDER — HEPARIN (PORCINE) IN NACL 100-0.45 UNIT/ML-% IJ SOLN
1200.0000 [IU]/h | INTRAMUSCULAR | Status: DC
  Filled 2014-01-09: qty 250

## 2014-01-09 MED ORDER — LORATADINE 10 MG PO TABS: 10.0000 mg | ORAL_TABLET | Freq: Every day | ORAL | Status: DC | PRN

## 2014-01-09 MED ORDER — SODIUM CHLORIDE 0.9 % IV SOLN: INTRAVENOUS | Status: DC

## 2014-01-09 MED ORDER — ONDANSETRON HCL 4 MG/2ML IJ SOLN: 4.0000 mg | Freq: Four times a day (QID) | INTRAMUSCULAR | Status: DC | PRN

## 2014-01-09 MED ORDER — DILTIAZEM HCL 25 MG/5ML IV SOLN
20.0000 mg | Freq: Once | INTRAVENOUS | Status: AC
  Administered 2014-01-09: 20 mg via INTRAVENOUS
  Filled 2014-01-09: qty 5

## 2014-01-09 MED ORDER — ASPIRIN 325 MG PO TBEC: 325.0000 mg | DELAYED_RELEASE_TABLET | Freq: Every day | ORAL | Status: DC

## 2014-01-09 MED ORDER — LORAZEPAM 1 MG PO TABS
1.0000 mg | ORAL_TABLET | Freq: Once | ORAL | Status: AC
  Administered 2014-01-09: 1 mg via ORAL
  Filled 2014-01-09: qty 1

## 2014-01-09 MED ORDER — FLUTICASONE PROPIONATE 50 MCG/ACT NA SUSP: 2.0000 | Freq: Every day | NASAL | Status: DC | PRN

## 2014-01-09 MED ORDER — ACETAMINOPHEN 325 MG PO TABS
650.0000 mg | ORAL_TABLET | Freq: Four times a day (QID) | ORAL | Status: DC | PRN
  Administered 2014-01-09: 650 mg via ORAL
  Filled 2014-01-09: qty 2

## 2014-01-09 MED ORDER — METOPROLOL SUCCINATE 12.5 MG HALF TABLET: 12.5000 mg | ORAL_TABLET | Freq: Every day | ORAL | Status: DC

## 2014-01-09 MED ORDER — SODIUM CHLORIDE 0.9 % IJ SOLN
3.0000 mL | Freq: Two times a day (BID) | INTRAMUSCULAR | Status: DC
  Administered 2014-01-09: 3 mL via INTRAVENOUS

## 2014-01-09 MED ORDER — HEPARIN BOLUS VIA INFUSION
4000.0000 [IU] | Freq: Once | INTRAVENOUS | Status: DC
  Filled 2014-01-09: qty 4000

## 2014-01-09 MED ORDER — NITROGLYCERIN 2 % TD OINT
0.5000 [in_us] | TOPICAL_OINTMENT | Freq: Once | TRANSDERMAL | Status: AC
  Administered 2014-01-09: 0.5 [in_us] via TOPICAL
  Filled 2014-01-09: qty 1

## 2014-01-09 MED ORDER — ACETAMINOPHEN 650 MG RE SUPP: 650.0000 mg | Freq: Four times a day (QID) | RECTAL | Status: DC | PRN

## 2014-01-09 NOTE — Progress Notes (Signed)
ANTICOAGULATION CONSULT NOTE - Initial Consult  Pharmacy Consult for heparin Indication: atrial fibrillation  Allergies  Allergen Reactions  . Sausage [Pickled Meat]   . Sulfamethoxazole     flushing    Patient Measurements: Height: 5\' 8"  (172.7 cm) Weight: 175 lb (79.379 kg) IBW/kg (Calculated) : 63.9  Vital Signs: Temp: 97.6 F (36.4 C) (03/18 0442) Temp src: Oral (03/18 0442) BP: 115/73 mmHg (03/18 0615) Pulse Rate: 75 (03/18 0615)  Labs:  Recent Labs  01/09/14 0501  HGB 12.4  HCT 37.2  PLT 276  CREATININE 0.55  TROPONINI <0.30    Estimated Creatinine Clearance: 92.1 ml/min (by C-G formula based on Cr of 0.55).   Medical History: Past Medical History  Diagnosis Date  . Allergy   . Anemia   . GERD (gastroesophageal reflux disease)   . Migraines   . Shingles     Medications:  Prescriptions prior to admission  Medication Sig Dispense Refill  . ascorbic acid (VITAMIN C) 500 MG tablet Take 500 mg by mouth 2 (two) times daily.      . fexofenadine (ALLEGRA) 180 MG tablet Take 180 mg by mouth daily.      . fluticasone (FLONASE) 50 MCG/ACT nasal spray Place 2 sprays into both nostrils daily as needed.  16 g  2  . Magnesium 400 MG CAPS Take 1 capsule by mouth at bedtime.       . Probiotic Product (PROBIOTIC FORMULA PO) Take 1 tablet by mouth daily.        Scheduled:  . aspirin EC  325 mg Oral Daily  . sodium chloride  3 mL Intravenous Q12H   Infusions:  . sodium chloride      Assessment: 53yo female was awoken from sleep by pounding in chest, found w/ PVCs and Afib, rate controlled after Cardizem bolus though remains in Afib, to begin IV anticoagulation.  Goal of Therapy:  Heparin level 0.3-0.7 units/ml Monitor platelets by anticoagulation protocol: Yes   Plan:  Will give heparin 4000 units IV bolus followed by gtt at 1200 units/hr and monitor heparin levels and CBC; f/u plan for long-term anticoag (CHADS2=0).  Wynona Neat, PharmD, BCPS   01/09/2014,7:39 AM

## 2014-01-09 NOTE — Telephone Encounter (Signed)
Pt was discharged from the hospital today. She was in atrial fib, she wants to know if she can she still can have caffine drinks please?

## 2014-01-09 NOTE — ED Provider Notes (Signed)
CSN: [id number]     Arrival date & time 01/09/14  0429 History   First MD Initiated Contact with Patient 01/09/14 0430     Chief Complaint  Patient presents with  . Palpitations     (Consider location/radiation/quality/duration/timing/severity/associated sxs/prior Treatment) HPI Comments: 53 year old female, history of anxiety and panic attacks, history of acid reflux and a history of anemia. She presents with a complaint of palpitations which were acute in onset just prior to arrival, awoke her from sleep. She states that she felt "like my heart was beating out of my chest". She denies any shortness of breath, chest pain, swelling of the legs, fevers, chills, nausea, vomiting. She has recently started taking Allegra because of sinus congestion but is not on any other over-the-counter medications, denies any recent alcohol intake, she does drink a significant amount of caffeine including coffee and tea. She denies unexpected weight loss, diarrhea or any other complaints. She states that on the way to the hospital after taking aspirin, Xanax she had spontaneous improvement and no longer feels the palpitations. Paramedic rhythm strips show that the patient is still in atrial fibrillation  Patient is a 53 y.o. female presenting with palpitations. The history is provided by the patient and the EMS personnel.  Palpitations   Past Medical History  Diagnosis Date  . Allergy   . Anemia   . GERD (gastroesophageal reflux disease)   . Migraines   . Shingles    Past Surgical History  Procedure Laterality Date  . Ovarian cyst removal  1995    dr Edwyna Ready   Family History  Problem Relation Age of Onset  . Heart disease Mother   . Cancer Father 52    lung ca  . Heart disease Father 3    mi/cabg  . Colon cancer Cousin 30   History  Substance Use Topics  . Smoking status: Never Smoker   . Smokeless tobacco: Never Used  . Alcohol Use: No   OB History   Grav Para Term Preterm Abortions TAB  SAB Ect Mult Living                 Review of Systems  Cardiovascular: Positive for palpitations.  All other systems reviewed and are negative.      Allergies  Sausage and Sulfamethoxazole  Home Medications   Current Outpatient Rx  Name  Route  Sig  Dispense  Refill  . ascorbic acid (VITAMIN C) 500 MG tablet   Oral   Take 500 mg by mouth 2 (two) times daily.         . fexofenadine (ALLEGRA) 180 MG tablet   Oral   Take 180 mg by mouth daily.         . fluticasone (FLONASE) 50 MCG/ACT nasal spray   Each Nare   Place 2 sprays into both nostrils daily as needed.   16 g   2   . Magnesium 400 MG CAPS   Oral   Take 1 capsule by mouth at bedtime.          . Probiotic Product (PROBIOTIC FORMULA PO)   Oral   Take 1 tablet by mouth daily.           BP 98/82  Pulse 95  Temp(Src) 97.6 F (36.4 C) (Oral)  Resp 19  Ht 5\' 8"  (1.727 m)  Wt 175 lb (79.379 kg)  BMI 26.61 kg/m2  SpO2 99%  LMP 11/26/2013 Physical Exam  Nursing note and vitals reviewed. Constitutional: She  appears well-developed and well-nourished. No distress.  HENT:  Head: Normocephalic and atraumatic.  Mouth/Throat: Oropharynx is clear and moist. No oropharyngeal exudate.  Eyes: Conjunctivae and EOM are normal. Pupils are equal, round, and reactive to light. Right eye exhibits no discharge. Left eye exhibits no discharge. No scleral icterus.  Neck: Normal range of motion. Neck supple. No JVD present. No thyromegaly present.  Cardiovascular: Normal heart sounds and intact distal pulses.  Exam reveals no gallop and no friction rub.   No murmur heard. Mild tachycardia, irregularly irregular rhythm, normal pulses at the radial arteries, no JVD  Pulmonary/Chest: Effort normal and breath sounds normal. No respiratory distress. She has no wheezes. She has no rales.  Abdominal: Soft. Bowel sounds are normal. She exhibits no distension and no mass. There is no tenderness.  Musculoskeletal: Normal range of  motion. She exhibits no edema and no tenderness.  Lymphadenopathy:    She has no cervical adenopathy.  Neurological: She is alert. Coordination normal.  Skin: Skin is warm and dry. No rash noted. No erythema.  Psychiatric: She has a normal mood and affect. Her behavior is normal.    ED Course  Procedures (including critical care time) Labs Review Labs Reviewed  CBC WITH DIFFERENTIAL - Abnormal; Notable for the following:    Eosinophils Relative 6 (*)    All other components within normal limits  COMPREHENSIVE METABOLIC PANEL  TROPONIN I  TSH   Imaging Review Dg Chest Portable 1 View  01/09/2014   CLINICAL DATA:  Shortness of breath. Palpitations. Atrial fibrillation.  EXAM: PORTABLE CHEST - 1 VIEW  COMPARISON:  CT chest 03/02/2006. Single view of the chest 06/28/2005.  FINDINGS: The lungs are clear. Heart size is normal. No pneumothorax or pleural effusion. No focal bony abnormality.  IMPRESSION: No acute disease.   Electronically Signed   By: Inge Rise M.D.   On: 01/09/2014 05:38     EKG Interpretation None      MDM   Final diagnoses:  Atrial fibrillation  Chest pain    Other than mild atrial fibrillation the patient has no other focal findings on her exam nor her history that would suggest a pathologic source for her atrial fibrillation. It may be indigestion of increased ability with caffeine, possibly antihistamine in the form of Allegra, possibly idiopathic as family members have been reported to have arrhythmias in the past. We'll give Cardizem, labs, chest x-ray, reevaluate.  Discussed with hospitalist who will admit. Still in atrial fibrillation however rate control successful. Troponin negative, labs unremarkable and chest x-ray unremarkable.   Meds given in ED:  Medications  nitroGLYCERIN (NITROGLYN) 2 % ointment 0.5 inch (not administered)  diltiazem (CARDIZEM) injection 20 mg (0 mg Intravenous Stopped 01/09/14 0506)  LORazepam (ATIVAN) tablet 1 mg (1 mg  Oral Given 01/09/14 0534)    New Prescriptions   No medications on file      Johnna Acosta, MD 01/09/14 [phone]

## 2014-01-09 NOTE — Discharge Summary (Signed)
Physician Discharge Summary  Patient ID: [name] MRN: [id number] DOB/AGE: 1960-10-27 53 y.o.  Admit date: 01/09/2014 Discharge date: 01/09/2014  Primary Care Physician:  Lorayne Marek, MD  Discharge Diagnoses:    . Atrial fibrillation with RVR- new onset, now spontaneously converted to normal sinus rhythm  . Chest pain likely due to atrial fibrillation  . hyperlipidemia  Consults: Cardiology, Dr. [name]   Recommendations for Outpatient Follow-up:  Patient will have outpatient 2-D echo, nuclear medicine stress test outpatient  TSH 4.379  Allergies:   Allergies  Allergen Reactions  . Sausage [Pickled Meat]   . Sulfamethoxazole     flushing     Discharge Medications:   Medication List    STOP taking these medications       fexofenadine 180 MG tablet  Commonly known as:  ALLEGRA      TAKE these medications       ascorbic acid 500 MG tablet  Commonly known as:  VITAMIN C  Take 500 mg by mouth 2 (two) times daily.     aspirin 325 MG EC tablet  Take 1 tablet (325 mg total) by mouth daily.  Start taking on:  01/10/2014     fluticasone 50 MCG/ACT nasal spray  Commonly known as:  FLONASE  Place 2 sprays into both nostrils daily as needed.     loratadine 10 MG tablet  Commonly known as:  CLARITIN  Take 1 tablet (10 mg total) by mouth daily as needed for allergies.     Magnesium 400 MG Caps  Take 1 capsule by mouth at bedtime.     metoprolol succinate 12.5 mg Tb24 24 hr tablet  Commonly known as:  TOPROL-XL  Take 0.5 tablets (12.5 mg total) by mouth daily.     PROBIOTIC FORMULA PO  Take 1 tablet by mouth daily.         Brief H and P: For complete details please refer to admission H and P, but in brief [name] is a 53 y.o. 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 Since the palpitations became persistent patient came to the ER. In  the ER patient was found to be in A. fib with RVR and Cardizem 20 mg IV bolus was given after which patient's heart rate decreased but still in A. fib. Patient also after coming to the ER started developing some chest pressure for which patient was placed on nitroglycerin patch following which patient symptoms resolved. Patient states he has never had been been diagnosed with atrial fibrillation previously. She was recently placed on Allegra for rhinitis. Patient otherwise denies any shortness of breath nausea vomiting abdominal pain diarrhea fever chills headache or any focal deficits.   Hospital Course:  Atrial fibrillation with RVR: Patient was admitted to telemetry, patient apparently woke her from sleep with palpitations. She was noticed to have mild tachycardia irregularly irregular rhythm, new onset atrial fibrillation. Patient was given one dose of IV Cardizem. D-dimer was negative. 2 sets of cardiac enzymes were negative for any acute ACS.CHADS2VASC score is zero. Cardiology was consulted and recommended outpatient 2-D echo and nuclear medicine stress test. Patient spontaneously converted in the ER to normal sinus rhythm.  She was recommended aspirin 325 mg daily and lowest dose of Toprol-XL 12.5mg  daily. Patient was recommended to hold Toprol-XL if her sBP is running low less than 100. She will follow outpatient with Dr. [name]  Day of Discharge BP 105/67  Pulse 72  Temp(Src) 97.9 F (36.6 C) (Oral)  Resp 18  Ht 5\' 8"  (1.727 m)  Wt 79.379 kg (175 lb)  BMI 26.61 kg/m2  SpO2 97%  LMP 11/26/2013  Physical Exam: General: Alert and awake oriented x3 not in any acute distress. HEENT: anicteric sclera, pupils reactive to light and accommodation CVS: S1-S2 clear no murmur rubs or gallops Chest: clear to auscultation bilaterally, no wheezing rales or rhonchi Abdomen: soft nontender, nondistended, normal bowel sounds Extremities: no cyanosis, clubbing or edema noted bilaterally Neuro:  Cranial nerves II-XII intact, no focal neurological deficits   The results of significant diagnostics from this hospitalization (including imaging, microbiology, ancillary and laboratory) are listed below for reference.    LAB RESULTS: Basic Metabolic Panel:  Recent Labs Lab 01/09/14 0501 01/09/14 0850  NA 141 140  K 3.8 4.1  CL 102 101  CO2 25 25  GLUCOSE 93 128*  BUN 14 12  CREATININE 0.55 0.55  CALCIUM 10.0 9.5   Liver Function Tests:  Recent Labs Lab 01/09/14 0501 01/09/14 0850  AST 22 21  ALT 16 16  ALKPHOS 57 57  BILITOT 0.3 0.4  PROT 7.5 7.2  ALBUMIN 3.6 3.4*   No results found for this basename: LIPASE, AMYLASE,  in the last 168 hours No results found for this basename: AMMONIA,  in the last 168 hours CBC:  Recent Labs Lab 01/09/14 0501 01/09/14 0850  WBC 6.3 5.6  NEUTROABS 3.5 4.1  HGB 12.4 11.9*  HCT 37.2 35.7*  MCV 90.1 90.8  PLT 276 250   Cardiac Enzymes:  Recent Labs Lab 01/09/14 0501 01/09/14 0850  TROPONINI <0.30 <0.30   BNP: No components found with this basename: POCBNP,  CBG: No results found for this basename: GLUCAP,  in the last 168 hours  Significant Diagnostic Studies:  Dg Chest Portable 1 View  01/09/2014   CLINICAL DATA:  Shortness of breath. Palpitations. Atrial fibrillation.  EXAM: PORTABLE CHEST - 1 VIEW  COMPARISON:  CT chest 03/02/2006. Single view of the chest 06/28/2005.  FINDINGS: The lungs are clear. Heart size is normal. No pneumothorax or pleural effusion. No focal bony abnormality.  IMPRESSION: No acute disease.   Electronically Signed   By: Inge Rise M.D.   On: 01/09/2014 05:38      Disposition and Follow-up:     Discharge Orders   Future Appointments Provider Department Dept Phone   01/29/2014 4:15 PM Pixie Casino, MD [facility] [phone]   Future Orders Complete By Expires   Diet - low sodium heart healthy  As directed    Increase activity slowly  As directed         DISPOSITION: Home DIET: Heart healthy diet    DISCHARGE FOLLOW-UP Follow-up Information   Follow up with Pixie Casino, MD On 01/29/2014. (4:15 pm)    Specialty:  Cardiology   Contact information:   [address] [place] [address] [phone]       Follow up with Lorayne Marek, MD. Schedule an appointment as soon as possible for a visit in 2 weeks. (for hospital follow-up)    Specialty:  Internal Medicine   Contact information:   [place] [address] [phone]       Time spent on Discharge: 35 minutes  Signed:   Letroy Vazguez M.D. [facility] 01/09/2014, 1:17 PM Pager: [phone]

## 2014-01-09 NOTE — H&P (Signed)
[facility] History and Physical  BROOKE STEINHILBER NOM:[id number] DOB: 1960-11-14 DOA: 01/09/2014  Referring physician: ER physician. PCP: Lorayne Marek, MD   Chief Complaint: Palpitations.  HPI: [name] is a 53 y.o. 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 Since the palpitations became persistent patient came to the ER. In the ER patient was found to be in A. fib with RVR and Cardizem 20 mg IV bolus was given after which patient's heart rate decreased but still in A. fib. Patient also after coming to the ER started developing some chest pressure for which patient was placed on nitroglycerin patch following which patient symptoms resolved. Patient states he has never had been been diagnosed with atrial fibrillation previously. She was recently placed on Allegra for rhinitis. Patient otherwise denies any shortness of breath nausea vomiting abdominal pain diarrhea fever chills headache or any focal deficits.  Review of Systems: As presented in the history of presenting illness, rest negative.  Past Medical History  Diagnosis Date  . Allergy   . Anemia   . GERD (gastroesophageal reflux disease)   . Migraines   . Shingles    Past Surgical History  Procedure Laterality Date  . Ovarian cyst removal  1995    dr Edwyna Ready   Social History:  reports that she has never smoked. She has never used smokeless tobacco. She reports that she does not drink alcohol or use illicit drugs. Where does patient live home. Can patient participate in ADLs? Yes.  Allergies  Allergen Reactions  . Sausage [Pickled Meat]   . Sulfamethoxazole     flushing    Family History:  Family History  Problem Relation Age of Onset  . Heart disease Mother   . Cancer Father 17    lung ca  . Heart disease Father 69    mi/cabg  . Colon cancer Cousin 30      Prior to Admission medications    Medication Sig Start Date End Date Taking? Authorizing Provider  ascorbic acid (VITAMIN C) 500 MG tablet Take 500 mg by mouth 2 (two) times daily.   Yes Historical Provider, MD  fexofenadine (ALLEGRA) 180 MG tablet Take 180 mg by mouth daily.   Yes Historical Provider, MD  fluticasone (FLONASE) 50 MCG/ACT nasal spray Place 2 sprays into both nostrils daily as needed. 09/05/13  Yes Thurnell Lose, MD  Magnesium 400 MG CAPS Take 1 capsule by mouth at bedtime.    Yes Historical Provider, MD  Probiotic Product (PROBIOTIC FORMULA PO) Take 1 tablet by mouth daily.    Yes Historical Provider, MD    Physical Exam: Filed Vitals:   01/09/14 0530 01/09/14 0545 01/09/14 0600 01/09/14 0615  BP: 98/82 114/75 120/93 115/73  Pulse: 95 75 95 75  Temp:      TempSrc:      Resp: 19 13 15 18   Height:      Weight:      SpO2: 99% 96% 100% 98%     General:  Well-developed and nourished.  Eyes: Anicteric no pallor.  ENT: No discharge from the ears eyes nose mouth.  Neck: No mass felt.  Cardiovascular: S1-S2 heard.  Respiratory: No rhonchi or crepitations.  Abdomen: Soft nontender bowel sounds present. No guarding rigidity.  Skin: No rash.  Musculoskeletal: No edema.  Psychiatric: Appears normal.  Neurologic: Alert awake oriented to time place and person. Moves all extremities.  Labs  on Admission:  Basic Metabolic Panel:  Recent Labs Lab 01/09/14 0501  NA 141  K 3.8  CL 102  CO2 25  GLUCOSE 93  BUN 14  CREATININE 0.55  CALCIUM 10.0   Liver Function Tests:  Recent Labs Lab 01/09/14 0501  AST 22  ALT 16  ALKPHOS 57  BILITOT 0.3  PROT 7.5  ALBUMIN 3.6   No results found for this basename: LIPASE, AMYLASE,  in the last 168 hours No results found for this basename: AMMONIA,  in the last 168 hours CBC:  Recent Labs Lab 01/09/14 0501  WBC 6.3  NEUTROABS 3.5  HGB 12.4  HCT 37.2  MCV 90.1  PLT 276   Cardiac Enzymes:  Recent Labs Lab 01/09/14 0501  TROPONINI  <0.30    BNP (last 3 results) No results found for this basename: PROBNP,  in the last 8760 hours CBG: No results found for this basename: GLUCAP,  in the last 168 hours  Radiological Exams on Admission: Dg Chest Portable 1 View  01/09/2014   CLINICAL DATA:  Shortness of breath. Palpitations. Atrial fibrillation.  EXAM: PORTABLE CHEST - 1 VIEW  COMPARISON:  CT chest 03/02/2006. Single view of the chest 06/28/2005.  FINDINGS: The lungs are clear. Heart size is normal. No pneumothorax or pleural effusion. No focal bony abnormality.  IMPRESSION: No acute disease.   Electronically Signed   By: Inge Rise M.D.   On: 01/09/2014 05:38    EKG: Independently reviewed. A. fib.  Assessment/Plan Principal Problem:   Atrial fibrillation with RVR Active Problems:   Chest pain   1. Atrial fibrillation with RVR - presently rate is better after Cardizem bolus. We will monitor in telemetry. Check cardiac markers d-dimer and thyroid function test. Check 2-D echo. For now patient will be placed on IV heparin infusion until patient's complete workup was done and also in case patient may need cardioversion.CHADS2VASC score is zero. 2. Chest pain - presently chest pain-free after nitroglycerin patch. Cycle cardiac markers and check 2-D echo and d-dimer. 3. History of migraine - denies any headache recently. 4. History of allergic rhinitis - on when necessary Flonase. 5. History of anxiety.    Code Status: Full code.  Family Communication: Patient's brother at the bedside.  Disposition Plan: Admit to inpatient.    Azion Centrella N. [facility] Pager [phone].  If 7PM-7AM, please contact night-coverage [email or web address] Password [email or web address] 01/09/2014, 7:05 AM

## 2014-01-09 NOTE — Discharge Instructions (Signed)
Atrial Fibrillation Atrial fibrillation is a condition that causes your heart to beat irregularly. It may also cause your heart to beat faster than normal. Atrial fibrillation can prevent your heart from pumping blood normally. It increases your risk of stroke and heart problems. HOME CARE  Take medications as told by your doctor.  Only take medications that your doctor says are safe. Some medications can make the condition worse or happen again.  If blood thinners were prescribed by your doctor, take them exactly as told. Too much can cause bleeding. Too little and you will not have the needed protection against stroke and other problems.  Perform blood tests at home if told by your doctor.  Perform blood tests exactly as told by your doctor.  Do not drink alcohol.  Do not drink beverages with caffeine such as coffee, soda, and some teas.  Maintain a healthy weight.  Do not use diet pills unless your doctor says they are safe. They may make heart problems worse.  Follow diet instructions as told by your doctor.  Exercise regularly as told by your doctor.  Keep all follow-up appointments. GET HELP RIGHT AWAY IF:   You have chest or belly (abdominal) pain.  You feel sick to your stomach (nauseous)  You suddenly have swollen feet and ankles.  You feel dizzy.  You face, arms, or legs feel numb or weak.  There is a change in your vision or speech.  You notice a change in the speed, rhythm, or strength of your heartbeat.  You suddenly begin peeing (urinating) more often.  You get tired more easily when moving or exercising. MAKE SURE YOU:   Understand these instructions.  Will watch your condition.  Will get help right away if you are not doing well or get worse. Document Released: 07/20/2008 Document Revised: 02/05/2013 Document Reviewed: 11/21/2012 [facility] Patient Information 2014 [place].   Cardiac Diet This diet can help prevent heart disease and  stroke. Many factors influence your heart health, including eating and exercise habits. Coronary risk rises a lot with abnormal blood fat (lipid) levels. Cardiac meal planning includes limiting unhealthy fats, increasing healthy fats, and making other small dietary changes. General guidelines are as follows:  Adjust calorie intake to reach and maintain desirable body weight.  Limit total fat intake to less than 30% of total calories. Saturated fat should be less than 7% of calories.  Saturated fats are found in animal products and in some vegetable products. Saturated vegetable fats are found in coconut oil, cocoa butter, palm oil, and palm kernel oil. Read labels carefully to avoid these products as much as possible. Use butter in moderation. Choose tub margarines and oils that have 2 grams of fat or less. Good cooking oils are canola and olive oils.  Practice low-fat cooking techniques. Do not fry food. Instead, broil, bake, boil, steam, grill, roast on a rack, stir-fry, or microwave it. Other fat reducing suggestions include:  Remove the skin from poultry.  Remove all visible fat from meats.  Skim the fat off stews, soups, and gravies before serving them.  Steam vegetables in water or broth instead of sauting them in fat.  Avoid foods with trans fat (or hydrogenated oils), such as commercially fried foods and commercially baked goods. Commercial shortening and deep-frying fats will contain trans fat.  Increase intake of fruits, vegetables, whole grains, and legumes to replace foods high in fat.  Increase consumption of nuts, legumes, and seeds to at least 4 servings weekly.  One serving of a legume equals  cup, and 1 serving of nuts or seeds equals  cup.  Choose whole grains more often. Have 3 servings per day (a serving is 1 ounce [oz]).  Eat 4 to 5 servings of vegetables per day. A serving of vegetables is 1 cup of raw leafy vegetables;  cup of raw or cooked cut-up vegetables;  cup  of vegetable juice.  Eat 4 to 5 servings of fruit per day. A serving of fruit is 1 medium whole fruit;  cup of dried fruit;  cup of fresh, frozen, or canned fruit;  cup of 100% fruit juice.  Increase your intake of dietary fiber to 20 to 30 grams per day. Insoluble fiber may help lower your risk of heart disease and may help curb your appetite.  Soluble fiber binds cholesterol to be removed from the blood. Foods high in soluble fiber are dried beans, citrus fruits, oats, apples, bananas, broccoli, Brussels sprouts, and eggplant.  Try to include foods fortified with plant sterols or stanols, such as yogurt, breads, juices, or margarines. Choose several fortified foods to achieve a daily intake of 2 to 3 grams of plant sterols or stanols.  Foods with omega-3 fats can help reduce your risk of heart disease. Aim to have a 3.5 oz portion of fatty fish twice per week, such as salmon, mackerel, albacore tuna, sardines, lake trout, or herring. If you wish to take a fish oil supplement, choose one that contains 1 gram of both DHA and EPA.  Limit processed meats to 2 servings (3 oz portion) weekly.  Limit the sodium in your diet to 1500 milligrams (mg) per day. If you have high blood pressure, talk to a registered dietitian about a DASH (Dietary Approaches to Stop Hypertension) eating plan.  Limit sweets and beverages with added sugar, such as soda, to no more than 5 servings per week. One serving is:   1 tablespoon sugar.  1 tablespoon jelly or jam.   cup sorbet.  1 cup lemonade.   cup regular soda. CHOOSING FOODS Starches  Allowed: Breads: All kinds (wheat, rye, raisin, white, oatmeal, [place], [place], and English muffin bread). Low-fat rolls: English muffins, frankfurter and hamburger buns, bagels, pita bread, tortillas (not fried). Pancakes, waffles, biscuits, and muffins made with recommended oil.  Avoid: Products made with saturated or trans fats, oils, or whole milk products. Butter  rolls, cheese breads, croissants. Commercial doughnuts, muffins, sweet rolls, biscuits, waffles, pancakes, store-bought mixes. Crackers  Allowed: Low-fat crackers and snacks: Animal, graham, rye, saltine (with recommended oil, no lard), oyster, and matzo crackers. Bread sticks, melba toast, rusks, flatbread, pretzels, and light popcorn.  Avoid: High-fat crackers: cheese crackers, butter crackers, and those made with coconut, palm oil, or trans fat (hydrogenated oils). Buttered popcorn. Cereals  Allowed: Hot or cold whole-grain cereals.  Avoid: Cereals containing coconut, hydrogenated vegetable fat, or animal fat. Potatoes / Pasta / Rice  Allowed: All kinds of potatoes, rice, and pasta (such as macaroni, spaghetti, and noodles).  Avoid: Pasta or rice prepared with cream sauce or high-fat cheese. Chow mein noodles, [place] fries. Vegetables  Allowed: All vegetables and vegetable juices.  Avoid: Fried vegetables. Vegetables in cream, butter, or high-fat cheese sauces. Limit coconut. Fruit in cream or custard. Protein  Allowed: Limit your intake of meat, seafood, and poultry to no more than 6 oz (cooked weight) per day. All lean, well-trimmed beef, veal, pork, and lamb. All chicken and [place] without skin. All fish and shellfish. Wild game:  wild duck, rabbit, pheasant, and venison. Egg whites or low-cholesterol egg substitutes may be used as desired. Meatless dishes: recipes with dried beans, peas, lentils, and tofu (soybean curd). Seeds and nuts: all seeds and most nuts.  Avoid: Prime grade and other heavily marbled and fatty meats, such as short ribs, spare ribs, rib eye roast or steak, frankfurters, sausage, bacon, and high-fat luncheon meats, mutton. Caviar. Commercially fried fish. Domestic duck, goose, venison sausage. Organ meats: liver, gizzard, heart, chitterlings, brains, kidney, sweetbreads. Dairy  Allowed: Low-fat cheeses: nonfat or low-fat cottage cheese (1% or 2% fat), cheeses  made with part skim milk, such as mozzarella, farmers, string, or ricotta. (Cheeses should be labeled no more than 2 to 6 grams fat per oz.). Skim (or 1%) milk: liquid, powdered, or evaporated. Buttermilk made with low-fat milk. Drinks made with skim or low-fat milk or cocoa. Chocolate milk or cocoa made with skim or low-fat (1%) milk. Nonfat or low-fat yogurt.  Avoid: Whole milk cheeses, including colby, cheddar, [place], Monterey Jack, [place], [place], Argentine, American, Swiss, and blue. Creamed cottage cheese, cream cheese. Whole milk and whole milk products, including buttermilk or yogurt made from whole milk, drinks made from whole milk. Condensed milk, evaporated whole milk, and 2% milk. Soups and Combination Foods  Allowed: Low-fat low-sodium soups: broth, dehydrated soups, homemade broth, soups with the fat removed, homemade cream soups made with skim or low-fat milk. Low-fat spaghetti, lasagna, chili, and Spanish rice if low-fat ingredients and low-fat cooking techniques are used.  Avoid: Cream soups made with whole milk, cream, or high-fat cheese. All other soups. Desserts and Sweets  Allowed: Sherbet, fruit ices, gelatins, meringues, and angel food cake. Homemade desserts with recommended fats, oils, and milk products. Jam, jelly, honey, marmalade, sugars, and syrups. Pure sugar candy, such as gum drops, hard candy, jelly beans, marshmallows, mints, and small amounts of dark chocolate.  Avoid: Commercially prepared cakes, pies, cookies, frosting, pudding, or mixes for these products. Desserts containing whole milk products, chocolate, coconut, lard, palm oil, or palm kernel oil. Ice cream or ice cream drinks. Candy that contains chocolate, coconut, butter, hydrogenated fat, or unknown ingredients. Buttered syrups. Fats and Oils  Allowed: Vegetable oils: safflower, sunflower, corn, soybean, cottonseed, sesame, canola, olive, or peanut. Non-hydrogenated margarines. Salad dressing or  mayonnaise: homemade or commercial, made with a recommended oil. Low or nonfat salad dressing or mayonnaise.  Limit added fats and oils to 6 to 8 tsp per day (includes fats used in cooking, baking, salads, and spreads on bread). Remember to count the "hidden fats" in foods.  Avoid: Solid fats and shortenings: butter, lard, salt pork, bacon drippings. Gravy containing meat fat, shortening, or suet. Cocoa butter, coconut. Coconut oil, palm oil, palm kernel oil, or hydrogenated oils: these ingredients are often used in bakery products, nondairy creamers, whipped toppings, candy, and commercially fried foods. Read labels carefully. Salad dressings made of unknown oils, sour cream, or cheese, such as blue cheese and Roquefort. Cream, all kinds: half-and-half, light, heavy, or whipping. Sour cream or cream cheese (even if "light" or low-fat). Nondairy cream substitutes: coffee creamers and sour cream substitutes made with palm, palm kernel, hydrogenated oils, or coconut oil. Beverages  Allowed: Coffee (regular or decaffeinated), tea. Diet carbonated beverages, mineral water. Alcohol: Check with your caregiver. Moderation is recommended.  Avoid: Whole milk, regular sodas, and juice drinks with added sugar. Condiments  Allowed: All seasonings and condiments. Cocoa powder. "Cream" sauces made with recommended ingredients.  Avoid: Carob powder made with hydrogenated  fats. SAMPLE MENU Breakfast   cup orange juice   cup oatmeal  1 slice toast  1 tsp margarine  1 cup skim milk Lunch  [place] sandwich with 2 oz [place], 2 slices bread  Lettuce and tomato slices  Fresh fruit  Carrot sticks  Coffee or tea Snack  Fresh fruit or low-fat crackers Dinner  3 oz lean ground beef  1 baked potato  1 tsp margarine   cup asparagus  Lettuce salad  1 tbs non-creamy dressing   cup peach slices  1 cup skim milk Document Released: 07/20/2008 Document Revised: 04/11/2012 Document  Reviewed: 01/04/2012 [employer] Patient Information 2014 [place], [place].

## 2014-01-09 NOTE — Progress Notes (Signed)
UR Completed Dowell Hoon Graves-Bigelow, RN,BSN [phone]  

## 2014-01-09 NOTE — ED Notes (Signed)
Monitor continues to show A-fib, controlled rate.  Pt c/o heaviness in mid-sternal chest area.  Dr Sabra Heck aware.

## 2014-01-09 NOTE — ED Notes (Signed)
Pt c/o some pain upper left chest to upper back area.  Monitor shows pt with pvc's, sinus pauses.  CXR changed to portable.

## 2014-01-09 NOTE — Telephone Encounter (Signed)
Returned call and pt verified x 2.  Pt informed message received.  Pt stated she didn't get any information about caffeine.  RN asked pt if she has the discharge papers she was given and pt confirmed.  Stated it just had a heart healthy diet.  RN asked pt to turn to page w/ person and heart titled "Atrial Fibrillation" and pt did.  Pt informed her home care instructions are listed and caffeine should be avoided.  Pt informed caffeine can cause an increased HR and w/ Afib her HR is already elevated at times.  Pt wanted to know if RN was telling her she can't have caffeine at all.  Pt informed it would be up to her, but it is not recommended as explained previously.  Pt informed if she drinks caffeine, she could experience a higher HR and other discomfort from Afib.  Pt verbalized understanding and agreed w/ plan.

## 2014-01-09 NOTE — Progress Notes (Signed)
Patient's b/p=97/63, I notified Dr. Tana Coast, she stated to hold today's dose of lopressor and have patient resume tomorrow if SBP was greater than 100.  Patient was informed of this and verbalized she understood.

## 2014-01-09 NOTE — ED Notes (Signed)
Startled from sleep, felt heart pounding in chest.

## 2014-01-09 NOTE — Consult Note (Signed)
CONSULTATION NOTE  Reason for Consult: A-fib with RVR, Chest pain  Requesting Physician: Dr. Tana Coast  Cardiologist:  [facility])  HPI: This is a 53 y.o. female with a past medical history significant for migraines, allergic rhinitis, and GERD, but a strong family history of CAD and aneurysm. 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 Since the palpitations became persistent patient came to the ER. In the ER patient was found to be in A. fib with RVR and Cardizem 20 mg IV bolus was given after which patient's heart rate decreased but still in A. fib. Patient also after coming to the ER started developing some chest pressure for which patient was placed on nitroglycerin patch following which patient symptoms resolved. Shortly after she converted to sinus rhythm. She did have an extensive cardiac work-up by Dr. Pauline Aus (who is a retired cardiologist) about 15 years ago.   PMHx:  Past Medical History  Diagnosis Date  . Allergy   . Anemia   . GERD (gastroesophageal reflux disease)   . Migraines   . Shingles    Past Surgical History  Procedure Laterality Date  . Ovarian cyst removal  1995    dr Edwyna Ready    FAMHx: Family History  Problem Relation Age of Onset  . Heart disease Mother   . Cancer Father 67    lung ca  . Heart disease Father 46    mi/cabg  . Colon cancer Cousin 30    SOCHx:  reports that she has never smoked. She has never used smokeless tobacco. She reports that she does not drink alcohol or use illicit drugs.  ALLERGIES: Allergies  Allergen Reactions  . Sausage [Pickled Meat]   . Sulfamethoxazole     flushing    ROS: A comprehensive review of systems was negative except for: Cardiovascular: positive for chest pain and irregular heart beat  HOME MEDICATIONS: Prescriptions prior to admission  Medication Sig Dispense Refill  . ascorbic acid  (VITAMIN C) 500 MG tablet Take 500 mg by mouth 2 (two) times daily.      . fexofenadine (ALLEGRA) 180 MG tablet Take 180 mg by mouth daily.      . fluticasone (FLONASE) 50 MCG/ACT nasal spray Place 2 sprays into both nostrils daily as needed.  16 g  2  . Magnesium 400 MG CAPS Take 1 capsule by mouth at bedtime.       . Probiotic Product (PROBIOTIC FORMULA PO) Take 1 tablet by mouth daily.         HOSPITAL MEDICATIONS: Prior to Admission:  Prescriptions prior to admission  Medication Sig Dispense Refill  . ascorbic acid (VITAMIN C) 500 MG tablet Take 500 mg by mouth 2 (two) times daily.      . fexofenadine (ALLEGRA) 180 MG tablet Take 180 mg by mouth daily.      . fluticasone (FLONASE) 50 MCG/ACT nasal spray Place 2 sprays into both nostrils daily as needed.  16 g  2  . Magnesium 400 MG CAPS Take 1 capsule by mouth at bedtime.       . Probiotic Product (PROBIOTIC FORMULA PO) Take 1 tablet by mouth daily.         VITALS: Blood pressure 115/73, pulse 75, temperature 97.6 F (36.4 C), temperature source Oral, resp. rate 18, height $RemoveBe'5\' 8"'ASjmPxHZB$  (1.727 m), weight 175 lb (79.379 kg), last menstrual period 11/26/2013,  SpO2 98.00%.  PHYSICAL EXAM: General appearance: alert and no distress Neck: no carotid bruit and no JVD Lungs: clear to auscultation bilaterally Heart: regular rate and rhythm, S1, S2 normal, no murmur, click, rub or gallop Abdomen: soft, non-tender; bowel sounds normal; no masses,  no organomegaly Extremities: extremities normal, atraumatic, no cyanosis or edema Pulses: 2+ and symmetric Skin: Skin color, texture, turgor normal. No rashes or lesions Neurologic: Grossly normal Psych: Mood, affect normal  LABS: Results for orders placed during the hospital encounter of 01/09/14 (from the past 48 hour(s))  CBC WITH DIFFERENTIAL     Status: Abnormal   Collection Time    01/09/14  5:01 AM      Result Value Ref Range   WBC 6.3  4.0 - 10.5 K/uL   RBC 4.13  3.87 - 5.11 MIL/uL    Hemoglobin 12.4  12.0 - 15.0 g/dL   HCT 37.2  36.0 - 46.0 %   MCV 90.1  78.0 - 100.0 fL   MCH 30.0  26.0 - 34.0 pg   MCHC 33.3  30.0 - 36.0 g/dL   RDW 13.3  11.5 - 15.5 %   Platelets 276  150 - 400 K/uL   Neutrophils Relative % 55  43 - 77 %   Neutro Abs 3.5  1.7 - 7.7 K/uL   Lymphocytes Relative 29  12 - 46 %   Lymphs Abs 1.8  0.7 - 4.0 K/uL   Monocytes Relative 10  3 - 12 %   Monocytes Absolute 0.6  0.1 - 1.0 K/uL   Eosinophils Relative 6 (*) 0 - 5 %   Eosinophils Absolute 0.4  0.0 - 0.7 K/uL   Basophils Relative 0  0 - 1 %   Basophils Absolute 0.0  0.0 - 0.1 K/uL  COMPREHENSIVE METABOLIC PANEL     Status: None   Collection Time    01/09/14  5:01 AM      Result Value Ref Range   Sodium 141  137 - 147 mEq/L   Potassium 3.8  3.7 - 5.3 mEq/L   Chloride 102  96 - 112 mEq/L   CO2 25  19 - 32 mEq/L   Glucose, Bld 93  70 - 99 mg/dL   BUN 14  6 - 23 mg/dL   Creatinine, Ser 0.55  0.50 - 1.10 mg/dL   Calcium 10.0  8.4 - 10.5 mg/dL   Total Protein 7.5  6.0 - 8.3 g/dL   Albumin 3.6  3.5 - 5.2 g/dL   AST 22  0 - 37 U/L   ALT 16  0 - 35 U/L   Alkaline Phosphatase 57  39 - 117 U/L   Total Bilirubin 0.3  0.3 - 1.2 mg/dL   GFR calc non Af Amer >90  >90 mL/min   GFR calc Af Amer >90  >90 mL/min   Comment: (NOTE)     The eGFR has been calculated using the CKD EPI equation.     This calculation has not been validated in all clinical situations.     eGFR's persistently <90 mL/min signify possible Chronic Kidney     Disease.  TROPONIN I     Status: None   Collection Time    01/09/14  5:01 AM      Result Value Ref Range   Troponin I <0.30  <0.30 ng/mL   Comment:            Due to the release kinetics of cTnI,  a negative result within the first hours     of the onset of symptoms does not rule out     myocardial infarction with certainty.     If myocardial infarction is still suspected,     repeat the test at appropriate intervals.    IMAGING: Dg Chest Portable 1  View  01/09/2014   CLINICAL DATA:  Shortness of breath. Palpitations. Atrial fibrillation.  EXAM: PORTABLE CHEST - 1 VIEW  COMPARISON:  CT chest 03/02/2006. Single view of the chest 06/28/2005.  FINDINGS: The lungs are clear. Heart size is normal. No pneumothorax or pleural effusion. No focal bony abnormality.  IMPRESSION: No acute disease.   Electronically Signed   By: Inge Rise M.D.   On: 01/09/2014 05:38    HOSPITAL DIAGNOSES: Principal Problem:   Atrial fibrillation with RVR Active Problems:   Chest pain   IMPRESSION: 1. A-fib with RVR (CHADSVASC score is 1 - female) 2. Chest pain  RECOMMENDATION: 1. Awaiting second set of cardiac enzymes. If negative, can have outpatient NST and 2D echo in our [place] office. 2. Would discharge home on aspirin 325 mg (low risk for stroke) daily and toprol XL 12.5 mg daily. 3. Follow-up appointment with me after testing in the office.  Thanks for consulting [place].  Time Spent Directly with Patient: 30 minutes  Pixie Casino, MD, [facility] Attending Cardiologist [facility]  Thetis Schwimmer C 01/09/2014, 8:14 AM

## 2014-01-09 NOTE — ED Notes (Signed)
Report to [facility] on 3W.  Pt to go to room 35.

## 2014-01-10 NOTE — Progress Notes (Signed)
Done

## 2014-01-14 ENCOUNTER — Encounter (HOSPITAL_COMMUNITY): Payer: Self-pay | Admitting: Emergency Medicine

## 2014-01-14 ENCOUNTER — Telehealth: Payer: Self-pay | Admitting: Internal Medicine

## 2014-01-14 ENCOUNTER — Emergency Department (HOSPITAL_COMMUNITY)
Admission: EM | Admit: 2014-01-14 | Discharge: 2014-01-14 | Disposition: A | Discharge status: Home / Self Care | Payer: No Typology Code available for payment source | Attending: Emergency Medicine | Admitting: Emergency Medicine

## 2014-01-14 ENCOUNTER — Other Ambulatory Visit: Payer: Self-pay | Admitting: Internal Medicine

## 2014-01-14 ENCOUNTER — Emergency Department (HOSPITAL_COMMUNITY): Payer: No Typology Code available for payment source

## 2014-01-14 DIAGNOSIS — I4891 Unspecified atrial fibrillation: Secondary | ICD-10-CM

## 2014-01-14 DIAGNOSIS — D649 Anemia, unspecified: Secondary | ICD-10-CM | POA: Insufficient documentation

## 2014-01-14 DIAGNOSIS — Z8679 Personal history of other diseases of the circulatory system: Secondary | ICD-10-CM | POA: Insufficient documentation

## 2014-01-14 DIAGNOSIS — Z8619 Personal history of other infectious and parasitic diseases: Secondary | ICD-10-CM | POA: Insufficient documentation

## 2014-01-14 DIAGNOSIS — R079 Chest pain, unspecified: Secondary | ICD-10-CM

## 2014-01-14 DIAGNOSIS — Z8719 Personal history of other diseases of the digestive system: Secondary | ICD-10-CM | POA: Insufficient documentation

## 2014-01-14 DIAGNOSIS — Z7982 Long term (current) use of aspirin: Secondary | ICD-10-CM | POA: Insufficient documentation

## 2014-01-14 DIAGNOSIS — Z79899 Other long term (current) drug therapy: Secondary | ICD-10-CM | POA: Insufficient documentation

## 2014-01-14 DIAGNOSIS — R6884 Jaw pain: Secondary | ICD-10-CM | POA: Insufficient documentation

## 2014-01-14 DIAGNOSIS — R0789 Other chest pain: Secondary | ICD-10-CM | POA: Insufficient documentation

## 2014-01-14 DIAGNOSIS — M549 Dorsalgia, unspecified: Secondary | ICD-10-CM | POA: Insufficient documentation

## 2014-01-14 LAB — CBC
HCT: 37.6 % (ref 36.0–46.0)
Hemoglobin: 12.6 g/dL (ref 12.0–15.0)
MCH: 30.3 pg (ref 26.0–34.0)
MCHC: 33.5 g/dL (ref 30.0–36.0)
MCV: 90.4 fL (ref 78.0–100.0)
PLATELETS: 285 10*3/uL (ref 150–400)
RBC: 4.16 MIL/uL (ref 3.87–5.11)
RDW: 13.4 % (ref 11.5–15.5)
WBC: 6.4 10*3/uL (ref 4.0–10.5)

## 2014-01-14 LAB — BASIC METABOLIC PANEL
BUN: 19 mg/dL (ref 6–23)
CO2: 28 meq/L (ref 19–32)
Calcium: 10.1 mg/dL (ref 8.4–10.5)
Chloride: 99 mEq/L (ref 96–112)
Creatinine, Ser: 0.59 mg/dL (ref 0.50–1.10)
GFR calc Af Amer: >90 mL/min (ref >90)
GFR calc non Af Amer: >90 mL/min (ref >90)
Glucose, Bld: 90 mg/dL (ref 70–99)
Potassium: 4.3 mEq/L (ref 3.7–5.3)
SODIUM: 139 meq/L (ref 137–147)

## 2014-01-14 LAB — I-STAT TROPONIN, ED: Troponin i, poc: 0.01 ng/mL (ref 0.00–0.08)

## 2014-01-14 NOTE — ED Notes (Signed)
Pt having jaw, shoulder pain, atrial fib, called Cardiologist and told to come here for enzyme testing, was told that if testing was normal they would run stress test and echo sooner.

## 2014-01-14 NOTE — Telephone Encounter (Signed)
Returned call and pt verified x 2.  Pt informed message received and advised ER visit.  Pt stated she had these same symptoms last week when she was discharged from hospital and they gave her metoprolol and ASA for her Afib.  Stated her enzymes were negative so they didn't do anything about it.    Pt c/o chest tightness, L shoulder/arm pain, jaw pain and back pain since being seen in ER.  Stated it hasn't really improved and may be a little worse.  Pt wanted to know if she could come in the office and have her enzymes checked before going to ER or if her tests could be done sooner.  Pt informed our office does not have a lab and if concern is heart attack, ER is best place to have labs drawn.  If positive, then we would have to send her to ER from office which delays treatment.    Dr. [name] in office and notified per pt request.  Advised pt could go to ER if symptoms worse.  If possible, pt's tests could be scheduled sooner.  Pt informed and asked if our office accepts the [facility].  Pt informed RN will check on that as well as appts since she preferred to have tests done sooner.  Ebony in Ancillary Scheduling notified and advised she could work pt in to have stress test this week and echo today.  RN inquired about [employer] and informed pt would need to call number on the card to find out how much she would be expected to pay, codes: 37106 (NST) 93306 (Echo).  Pt informed and codes given to pt. Pt decided to go to ER and will f/u in office afterwards.  Pt will call back if cardiac work-up negative to reschedule tests.

## 2014-01-14 NOTE — ED Provider Notes (Signed)
CSN: [id number]     Arrival date & time 01/14/14  1136 History   First MD Initiated Contact with Patient 01/14/14 1323     Chief Complaint  Patient presents with  . Shoulder Pain  . Jaw Pain  . Atrial Fibrillation     (Consider location/radiation/quality/duration/timing/severity/associated sxs/prior Treatment) HPI Comments: Patient is a 53 year old female with a past medical history of anemia, GERD, migraines and atrial fibrillation who presents to the emergency department complaining of left-sided back pain radiating towards her left shoulder and chest tightness continued from when she was discharged from the hospital 5 days ago. Denies chest pain, states it is just tightness. Patient was admitted to the hospital for new onset atrial fibrillation, put on metoprolol. She is not in atrial fibrillation arrival to the emergency department. Despite triage summary, patient denies jaw pain. She called her cardiologist Dr. Lysbeth Penner office and was advised to come to the emergency department for cardiac enzymes, and was told if they are normal she can followup in her office sooner than scheduled for an echocardiogram and stress test, possibly an echo later today. This is also documented in patient's chart and a telephone note from the office. Patient denies shortness of breath, nausea, vomiting or diaphoresis. 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  Patient is a 53 y.o. female presenting with shoulder pain and atrial fibrillation. The history is provided by the patient and medical records.  Shoulder Pain  Atrial Fibrillation    Past Medical History  Diagnosis Date  . Allergy   . Anemia   . GERD (gastroesophageal reflux disease)   . Migraines   . Shingles    Past Surgical History  Procedure Laterality Date  . Ovarian cyst removal  1995    dr Edwyna Ready   Family History   Problem Relation Age of Onset  . Heart disease Mother   . Cancer Father 48    lung ca  . Heart disease Father 43    mi/cabg  . Colon cancer Cousin 30   History  Substance Use Topics  . Smoking status: Never Smoker   . Smokeless tobacco: Never Used  . Alcohol Use: No   OB History   Grav Para Term Preterm Abortions TAB SAB Ect Mult Living                 Review of Systems    Allergies  Sausage and Sulfamethoxazole  Home Medications   Current Outpatient Rx  Name  Route  Sig  Dispense  Refill  . ALPRAZolam (XANAX) 0.25 MG tablet   Oral   Take 0.25 mg by mouth 3 (three) times daily as needed for anxiety.         Marland Kitchen ascorbic acid (VITAMIN C) 500 MG tablet   Oral   Take 500 mg by mouth 2 (two) times daily.         Marland Kitchen aspirin EC 325 MG EC tablet   Oral   Take 1 tablet (325 mg total) by mouth daily.   30 tablet   5   . Cyanocobalamin (B-12 PO)   Oral   Take 1 tablet by mouth daily.         . ferrous sulfate 325 (65 FE) MG EC tablet   Oral   Take 325 mg by mouth daily with breakfast.         .  fluticasone (FLONASE) 50 MCG/ACT nasal spray   Each Nare   Place 2 sprays into both nostrils daily as needed for allergies or rhinitis.         Marland Kitchen loratadine (CLARITIN) 10 MG tablet   Oral   Take 1 tablet (10 mg total) by mouth daily as needed for allergies.   30 tablet   2   . Magnesium 400 MG CAPS   Oral   Take 1 capsule by mouth at bedtime.          . metoprolol succinate (TOPROL-XL) 12.5 mg TB24 24 hr tablet   Oral   Take 0.5 tablets (12.5 mg total) by mouth daily.   30 tablet   4   . Probiotic Product (PROBIOTIC FORMULA PO)   Oral   Take 1 tablet by mouth daily.           BP 130/85  Pulse 75  Temp(Src) 97.7 F (36.5 C) (Oral)  Resp 16  Ht 5\' 8"  (1.727 m)  Wt 175 lb (79.379 kg)  BMI 26.61 kg/m2  SpO2 100%  LMP 12/05/2013 Physical Exam  Nursing note and vitals reviewed. Constitutional: She is oriented to person, place, and time. She  appears well-developed and well-nourished. No distress.  HENT:  Head: Normocephalic and atraumatic.  Mouth/Throat: Oropharynx is clear and moist.  Eyes: Conjunctivae and EOM are normal. Pupils are equal, round, and reactive to light.  Neck: Normal range of motion. Neck supple. No JVD present.  Cardiovascular: Normal rate, regular rhythm, normal heart sounds and intact distal pulses.   No extremity edema.  Pulmonary/Chest: Effort normal and breath sounds normal. No respiratory distress.  Musculoskeletal: Normal range of motion. She exhibits no edema and no tenderness.  Palpation of left upper back "feels good" per patient.  Neurological: She is alert and oriented to person, place, and time. She has normal strength. No sensory deficit.  Speech fluent, goal oriented. Moves limbs without ataxia. Equal grip strength bilateral.  Skin: Skin is warm and dry. She is not diaphoretic.  Psychiatric: She has a normal mood and affect. Her behavior is normal.    ED Course  Procedures (including critical care time) [place], ED   Imaging Review Dg Chest 2 View  01/14/2014   CLINICAL DATA:  Left upper shoulder pain, jaw pain, chest pain.  EXAM: CHEST  2 VIEW  COMPARISON:  01/09/2014  FINDINGS: Heart is normal size. Mild hyperinflation of the lungs. No confluent airspace opacity or effusion. No acute bony abnormality.  IMPRESSION: Hyperinflation.  No acute cardiopulmonary disease.   Electronically Signed   By: Rolm Baptise M.D.   On: 01/14/2014 12:47     EKG Interpretation   Date/Time:  Monday January 14 2014 11:40:07 EDT Ventricular Rate:  73 PR Interval:  150 QRS Duration: 78 QT Interval:  368 QTC Calculation: 405 R Axis:   68 Text Interpretation:  Sinus rhythm with occasional Premature ventricular  complexes Cannot rule out Anterior infarct , age undetermined No  significant change since last tracing Confirmed by [facility]  MD,  Jenny Reichmann  [phone]) on 01/14/2014 1:51:47 PM      MDM   Final diagnoses:  Back pain  Chest pain   Patient presenting with back pain and chest tightness. She is well appearing and in no apparent distress, afebrile with normal VS. EKG without significant changes, no afib. Labs obtained in triage prior to pt being seen, normal. Troponin negative.  CXR without acute finding. Note in chart from cardiology office says if normal enzymes, pt can be f/u outpatient for echo and stress test. Pt stable for d/c. Return precautions given. Patient states understanding of treatment care plan and is agreeable.     Illene Labrador, PA-C 01/14/14 1530

## 2014-01-14 NOTE — Telephone Encounter (Signed)
Please call,been having pain in her back and left shoulder,and upper left arm.Also tightness in her chest. Should she go to the ER?

## 2014-01-14 NOTE — Discharge Instructions (Signed)
Your cardiac enzyme troponin was negative today.  Chest Pain (Nonspecific) It is often hard to give a specific diagnosis for the cause of chest pain. There is always a chance that your pain could be related to something serious, such as a heart attack or a blood clot in the lungs. You need to follow up with your caregiver for further evaluation. CAUSES   Heartburn.  Pneumonia or bronchitis.  Anxiety or stress.  Inflammation around your heart (pericarditis) or lung (pleuritis or pleurisy).  A blood clot in the lung.  A collapsed lung (pneumothorax). It can develop suddenly on its own (spontaneous pneumothorax) or from injury (trauma) to the chest.  Shingles infection (herpes zoster virus). The chest wall is composed of bones, muscles, and cartilage. Any of these can be the source of the pain.  The bones can be bruised by injury.  The muscles or cartilage can be strained by coughing or overwork.  The cartilage can be affected by inflammation and become sore (costochondritis). DIAGNOSIS  Lab tests or other studies, such as X-rays, electrocardiography, stress testing, or cardiac imaging, may be needed to find the cause of your pain.  TREATMENT   Treatment depends on what may be causing your chest pain. Treatment may include:  Acid blockers for heartburn.  Anti-inflammatory medicine.  Pain medicine for inflammatory conditions.  Antibiotics if an infection is present.  You may be advised to change lifestyle habits. This includes stopping smoking and avoiding alcohol, caffeine, and chocolate.  You may be advised to keep your head raised (elevated) when sleeping. This reduces the chance of acid going backward from your stomach into your esophagus.  Most of the time, nonspecific chest pain will improve within 2 to 3 days with rest and mild pain medicine. HOME CARE INSTRUCTIONS   If antibiotics were prescribed, take your antibiotics as directed. Finish them even if you start to  feel better.  For the next few days, avoid physical activities that bring on chest pain. Continue physical activities as directed.  Do not smoke.  Avoid drinking alcohol.  Only take over-the-counter or prescription medicine for pain, discomfort, or fever as directed by your caregiver.  Follow your caregiver's suggestions for further testing if your chest pain does not go away.  Keep any follow-up appointments you made. If you do not go to an appointment, you could develop lasting (chronic) problems with pain. If there is any problem keeping an appointment, you must call to reschedule. SEEK MEDICAL CARE IF:   You think you are having problems from the medicine you are taking. Read your medicine instructions carefully.  Your chest pain does not go away, even after treatment.  You develop a rash with blisters on your chest. SEEK IMMEDIATE MEDICAL CARE IF:   You have increased chest pain or pain that spreads to your arm, neck, jaw, back, or abdomen.  You develop shortness of breath, an increasing cough, or you are coughing up blood.  You have severe back or abdominal pain, feel nauseous, or vomit.  You develop severe weakness, fainting, or chills.  You have a fever. THIS IS AN EMERGENCY. Do not wait to see if the pain will go away. Get medical help at once. Call your local emergency services (911 in [place] Do not drive yourself to the hospital. MAKE SURE YOU:   Understand these instructions.  Will watch your condition.  Will get help right away if you are not doing well or get worse. Document Released: 07/21/2005 Document Revised: 01/03/2012  Document Reviewed: 05/16/2008 [facility] Patient Information 2014 [place].  Back Pain, Adult Low back pain is very common. About 1 in 5 people have back pain.The cause of low back pain is rarely dangerous. The pain often gets better over time.About half of people with a sudden onset of back pain feel better in just 2 weeks. About 8  in 10 people feel better by 6 weeks.  CAUSES Some common causes of back pain include:  Strain of the muscles or ligaments supporting the spine.  Wear and tear (degeneration) of the spinal discs.  Arthritis.  Direct injury to the back. DIAGNOSIS Most of the time, the direct cause of low back pain is not known.However, back pain can be treated effectively even when the exact cause of the pain is unknown.Answering your caregiver's questions about your overall health and symptoms is one of the most accurate ways to make sure the cause of your pain is not dangerous. If your caregiver needs more information, he or she may order lab work or imaging tests (X-rays or MRIs).However, even if imaging tests show changes in your back, this usually does not require surgery. HOME CARE INSTRUCTIONS For many people, back pain returns.Since low back pain is rarely dangerous, it is often a condition that people can learn to [facility] their own.   Remain active. It is stressful on the back to sit or stand in one place. Do not sit, drive, or stand in one place for more than 30 minutes at a time. Take short walks on level surfaces as soon as pain allows.Try to increase the length of time you walk each day.  Do not stay in bed.Resting more than 1 or 2 days can delay your recovery.  Do not avoid exercise or work.Your body is made to move.It is not dangerous to be active, even though your back may hurt.Your back will likely heal faster if you return to being active before your pain is gone.  Pay attention to your body when you bend and lift. Many people have less discomfortwhen lifting if they bend their knees, keep the load close to their bodies,and avoid twisting. Often, the most comfortable positions are those that put less stress on your recovering back.  Find a comfortable position to sleep. Use a firm mattress and lie on your side with your knees slightly bent. If you lie on your back, put a pillow  under your knees.  Only take over-the-counter or prescription medicines as directed by your caregiver. Over-the-counter medicines to reduce pain and inflammation are often the most helpful.Your caregiver may prescribe muscle relaxant drugs.These medicines help dull your pain so you can more quickly return to your normal activities and healthy exercise.  Put ice on the injured area.  Put ice in a plastic bag.  Place a towel between your skin and the bag.  Leave the ice on for 15-20 minutes, 03-04 times a day for the first 2 to 3 days. After that, ice and heat may be alternated to reduce pain and spasms.  Ask your caregiver about trying back exercises and gentle massage. This may be of some benefit.  Avoid feeling anxious or stressed.Stress increases muscle tension and can worsen back pain.It is important to recognize when you are anxious or stressed and learn ways to manage it.Exercise is a great option. SEEK MEDICAL CARE IF:  You have pain that is not relieved with rest or medicine.  You have pain that does not improve in 1 week.  You  have new symptoms.  You are generally not feeling well. SEEK IMMEDIATE MEDICAL CARE IF:   You have pain that radiates from your back into your legs.  You develop new bowel or bladder control problems.  You have unusual weakness or numbness in your arms or legs.  You develop nausea or vomiting.  You develop abdominal pain.  You feel faint. Document Released: 10/11/2005 Document Revised: 04/11/2012 Document Reviewed: 03/01/2011 [facility] Patient Information 2014 [place], [place].

## 2014-01-15 ENCOUNTER — Encounter (HOSPITAL_COMMUNITY): Payer: Self-pay | Admitting: Emergency Medicine

## 2014-01-15 ENCOUNTER — Emergency Department (HOSPITAL_COMMUNITY)
Admission: EM | Admit: 2014-01-15 | Discharge: 2014-01-16 | Disposition: A | Discharge status: Home / Self Care | Payer: No Typology Code available for payment source | Attending: Emergency Medicine | Admitting: Emergency Medicine

## 2014-01-15 DIAGNOSIS — Z7982 Long term (current) use of aspirin: Secondary | ICD-10-CM | POA: Insufficient documentation

## 2014-01-15 DIAGNOSIS — I4891 Unspecified atrial fibrillation: Secondary | ICD-10-CM | POA: Insufficient documentation

## 2014-01-15 DIAGNOSIS — R0602 Shortness of breath: Secondary | ICD-10-CM | POA: Insufficient documentation

## 2014-01-15 DIAGNOSIS — R079 Chest pain, unspecified: Secondary | ICD-10-CM

## 2014-01-15 DIAGNOSIS — F419 Anxiety disorder, unspecified: Secondary | ICD-10-CM

## 2014-01-15 DIAGNOSIS — Z79899 Other long term (current) drug therapy: Secondary | ICD-10-CM | POA: Insufficient documentation

## 2014-01-15 DIAGNOSIS — Z8619 Personal history of other infectious and parasitic diseases: Secondary | ICD-10-CM | POA: Insufficient documentation

## 2014-01-15 DIAGNOSIS — M12819 Other specific arthropathies, not elsewhere classified, unspecified shoulder: Secondary | ICD-10-CM | POA: Insufficient documentation

## 2014-01-15 DIAGNOSIS — R0789 Other chest pain: Secondary | ICD-10-CM | POA: Insufficient documentation

## 2014-01-15 DIAGNOSIS — D649 Anemia, unspecified: Secondary | ICD-10-CM | POA: Insufficient documentation

## 2014-01-15 DIAGNOSIS — K219 Gastro-esophageal reflux disease without esophagitis: Secondary | ICD-10-CM | POA: Insufficient documentation

## 2014-01-15 DIAGNOSIS — F411 Generalized anxiety disorder: Secondary | ICD-10-CM | POA: Insufficient documentation

## 2014-01-15 HISTORY — DX: Unspecified atrial fibrillation: I48.91

## 2014-01-15 LAB — BASIC METABOLIC PANEL
BUN: 21 mg/dL (ref 6–23)
CHLORIDE: 99 meq/L (ref 96–112)
CO2: 26 mEq/L (ref 19–32)
Calcium: 9.3 mg/dL (ref 8.4–10.5)
Creatinine, Ser: 0.67 mg/dL (ref 0.50–1.10)
GFR calc non Af Amer: >90 mL/min (ref >90)
Glucose, Bld: 104 mg/dL — ABNORMAL HIGH (ref 70–99)
Potassium: 4 mEq/L (ref 3.7–5.3)
Sodium: 138 mEq/L (ref 137–147)

## 2014-01-15 LAB — CBC
HCT: 35.8 % — ABNORMAL LOW (ref 36.0–46.0)
Hemoglobin: 12.3 g/dL (ref 12.0–15.0)
MCH: 31.1 pg (ref 26.0–34.0)
MCHC: 34.4 g/dL (ref 30.0–36.0)
MCV: 90.4 fL (ref 78.0–100.0)
PLATELETS: 268 10*3/uL (ref 150–400)
RBC: 3.96 MIL/uL (ref 3.87–5.11)
RDW: 13.4 % (ref 11.5–15.5)
WBC: 8.4 10*3/uL (ref 4.0–10.5)

## 2014-01-15 LAB — I-STAT TROPONIN, ED
TROPONIN I, POC: 0 ng/mL (ref 0.00–0.08)
Troponin i, poc: 0 ng/mL (ref 0.00–0.08)

## 2014-01-15 MED ORDER — SODIUM CHLORIDE 0.9 % IV BOLUS (SEPSIS): 1000.0000 mL | Freq: Once | INTRAVENOUS | Status: DC

## 2014-01-15 NOTE — ED Notes (Signed)
Pt reports walking her dogs today and then onset of squeezing pain to bilateral chest, reports pain has now moved down into her abd. Was here yesterday for arm and shoulder pain, recent hx of new onset afib with rvr. ekg done at triage, NSR rate of 69. No acute distress noted.

## 2014-01-15 NOTE — ED Provider Notes (Signed)
CSN: [id number]     Arrival date & time 01/15/14  1729 History   First MD Initiated Contact with Patient 01/15/14 1922     Chief Complaint  Patient presents with  . Chest Pain     (Consider location/radiation/quality/duration/timing/severity/associated sxs/prior Treatment) The history is provided by the patient. No language interpreter was used.  [name] is a 53 year old female with past medical history of anemia, GERD, migraine, shingles and recently diagnosed with atrial fibrillation on 01/09/2014 presenting to the ED with sudden onset of chest pain that occurred while walking her dog. Patient reported that she became upset at something and started to have chest pain localize the Center for chest described as someone prepping and her chest that lasted approximately 20-30 minutes with associated shortness of breath. Stated that she took 2 Xanax and four tablets of 81 mg tablets with relief. Stated that she takes 325 mg of aspirin daily ever since she was diagnosed with atrial fibrillation on 01/09/2014. Stated that since 01/09/2014 she has been experiencing left shoulder pain, left scapula described as a constant gnawing sensation. 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 Patient reported that she denied chest pain at this time, but reported a heaviness towards the left side of her chest that is vague. Reported that she has a stress test scheduled with Dr. Daryl Eastern next week and echocardiogram to be performed tomorrow at 3:00 PM. Denied shortness of breath, difficulty breathing, blurred vision, numbness, tingling, abdominal pain, sudden loss of vision, nausea, vomiting, diarrhea, tingling. Cardiology Dr. [name] PCP Dr. Annitta Needs  Past Medical History  Diagnosis Date  . Allergy   . Anemia   . GERD (gastroesophageal reflux disease)   . Migraines   . Shingles   . Atrial fibrillation    Past  Surgical History  Procedure Laterality Date  . Ovarian cyst removal  1995    dr Edwyna Ready   Family History  Problem Relation Age of Onset  . Heart disease Mother   . Cancer Father 44    lung ca  . Heart disease Father 50    mi/cabg  . Colon cancer Cousin 30   History  Substance Use Topics  . Smoking status: Never Smoker   . Smokeless tobacco: Never Used  . Alcohol Use: No   OB History   Grav Para Term Preterm Abortions TAB SAB Ect Mult Living                 Review of Systems  Constitutional: Negative for fever and chills.  HENT: Negative for trouble swallowing.   Eyes: Negative for visual disturbance.  Respiratory: Positive for chest tightness and shortness of breath. Negative for cough.   Cardiovascular: Positive for chest pain. Negative for leg swelling.  Gastrointestinal: Negative for nausea, vomiting, abdominal pain and diarrhea.  Musculoskeletal: Negative for back pain and neck pain.  Neurological: Negative for syncope, weakness and headaches.  Psychiatric/Behavioral: The patient is nervous/anxious.   All other systems reviewed and are negative.      Allergies  Sausage and Sulfamethoxazole  Home Medications   Current Outpatient Rx  Name  Route  Sig  Dispense  Refill  . ALPRAZolam (XANAX) 0.25 MG tablet   Oral   Take 0.25 mg by mouth 3 (three) times daily as needed for anxiety.         Marland Kitchen ascorbic acid (VITAMIN C) 500 MG tablet   Oral  Take 500 mg by mouth 2 (two) times daily.         Marland Kitchen aspirin 81 MG tablet   Oral   Take 81 mg by mouth daily as needed for pain.         Marland Kitchen aspirin EC 325 MG EC tablet   Oral   Take 1 tablet (325 mg total) by mouth daily.   30 tablet   5   . Cyanocobalamin (B-12 PO)   Oral   Take 1 tablet by mouth daily.         . ferrous sulfate 325 (65 FE) MG EC tablet   Oral   Take 325 mg by mouth daily with breakfast.         . fluticasone (FLONASE) 50 MCG/ACT nasal spray   Each Nare   Place 2 sprays into both  nostrils daily as needed for allergies or rhinitis.         Marland Kitchen loratadine (CLARITIN) 10 MG tablet   Oral   Take 1 tablet (10 mg total) by mouth daily as needed for allergies.   30 tablet   2   . Magnesium 400 MG CAPS   Oral   Take 1 capsule by mouth at bedtime.          . metoprolol succinate (TOPROL-XL) 12.5 mg TB24 24 hr tablet   Oral   Take 0.5 tablets (12.5 mg total) by mouth daily.   30 tablet   4   . Probiotic Product (PROBIOTIC FORMULA PO)   Oral   Take 1 tablet by mouth daily.          Marland Kitchen omeprazole (PRILOSEC) 20 MG capsule   Oral   Take 1 capsule (20 mg total) by mouth daily.   5 capsule   0    BP 124/80  Pulse 66  Temp(Src) 98.8 F (37.1 C) (Oral)  Resp 14  SpO2 100%  LMP 12/05/2013 Physical Exam  Nursing note and vitals reviewed. Constitutional: She is oriented to person, place, and time. She appears well-developed and well-nourished. No distress.  HENT:  Head: Normocephalic and atraumatic.  Mouth/Throat: Oropharynx is clear and moist. No oropharyngeal exudate.  Eyes: Conjunctivae and EOM are normal. Pupils are equal, round, and reactive to light. Right eye exhibits no discharge. Left eye exhibits no discharge.  Neck: Normal range of motion. Neck supple. No tracheal deviation present.  Cardiovascular: Normal rate, regular rhythm and normal heart sounds.  Exam reveals no friction rub.   No murmur heard. Pulses:      Radial pulses are 2+ on the right side, and 2+ on the left side.       Dorsalis pedis pulses are 2+ on the right side, and 2+ on the left side.  Cap refill < 3 seconds Negative leg swelling and pitting edema noted  Pulmonary/Chest: Effort normal and breath sounds normal. No respiratory distress. She has no wheezes. She has no rales. She exhibits no tenderness.  Patient able to speak in full senses without difficulty Negative use of accessory muscles Negative stridor  Musculoskeletal: Normal range of motion.  Full ROM to upper and lower  extremities without difficulty noted, negative ataxia noted.  Lymphadenopathy:    She has no cervical adenopathy.  Neurological: She is alert and oriented to person, place, and time. No cranial nerve deficit. She exhibits normal muscle tone. Coordination normal.  Cranial nerves III-XII grossly intact Strength 5+/5+ to upper and lower extremities bilaterally with resistance applied, equal distribution noted Strength  intact with equal distribution Negative arm drift Gait proper, proper balance - negative sway, negative drift, negative step-offs  Skin: Skin is warm and dry. No rash noted. She is not diaphoretic. No erythema.  Psychiatric: She has a normal mood and affect. Her behavior is normal. Thought content normal.    ED Course  Procedures (including critical care time)  12:26 AM Denied chest pain, shortness of breath, difficulty breathing. Patient seen and assessed by attending physician who agreed to plan of discharge.   Results for orders placed during the hospital encounter of 01/15/14  CBC      Result Value Ref Range   WBC 8.4  4.0 - 10.5 K/uL   RBC 3.96  3.87 - 5.11 MIL/uL   Hemoglobin 12.3  12.0 - 15.0 g/dL   HCT 35.8 (*) 36.0 - 46.0 %   MCV 90.4  78.0 - 100.0 fL   MCH 31.1  26.0 - 34.0 pg   MCHC 34.4  30.0 - 36.0 g/dL   RDW 13.4  11.5 - 15.5 %   Platelets 268  150 - 400 K/uL  BASIC METABOLIC PANEL      Result Value Ref Range   Sodium 138  137 - 147 mEq/L   Potassium 4.0  3.7 - 5.3 mEq/L   Chloride 99  96 - 112 mEq/L   CO2 26  19 - 32 mEq/L   Glucose, Bld 104 (*) 70 - 99 mg/dL   BUN 21  6 - 23 mg/dL   Creatinine, Ser 0.67  0.50 - 1.10 mg/dL   Calcium 9.3  8.4 - 10.5 mg/dL   GFR calc non Af Amer >90  >90 mL/min   GFR calc Af Amer >90  >90 mL/min  I-STAT TROPOININ, ED      Result Value Ref Range   Troponin i, poc 0.00  0.00 - 0.08 ng/mL   Comment 3           I-STAT TROPOININ, ED      Result Value Ref Range   Troponin i, poc 0.00  0.00 - 0.08 ng/mL   Comment 3              Labs Review Labs Reviewed  CBC - Abnormal; Notable for the following:    HCT 35.8 (*)    All other components within normal limits  BASIC METABOLIC PANEL - Abnormal; Notable for the following:    Glucose, Bld 104 (*)    All other components within normal limits  I-STAT TROPOININ, ED  I-STAT TROPOININ, ED   Imaging Review Dg Chest 2 View  01/14/2014   CLINICAL DATA:  Left upper shoulder pain, jaw pain, chest pain.  EXAM: CHEST  2 VIEW  COMPARISON:  01/09/2014  FINDINGS: Heart is normal size. Mild hyperinflation of the lungs. No confluent airspace opacity or effusion. No acute bony abnormality.  IMPRESSION: Hyperinflation.  No acute cardiopulmonary disease.   Electronically Signed   By: Rolm Baptise M.D.   On: 01/14/2014 12:47     EKG Interpretation   Date/Time:  Tuesday January 15 2014 17:35:42 EDT Ventricular Rate:  69 PR Interval:  124 QRS Duration: 82 QT Interval:  376 QTC Calculation: 402 R Axis:   62 Text Interpretation:  Normal sinus rhythm Normal ECG Confirmed by ZAVITZ   MD, JOSHUA ([id number]) on 01/15/2014 9:50:49 PM      MDM   Final diagnoses:  Chest pain  Anxiety   Filed Vitals:   01/15/14 2135 01/15/14 2209 01/15/14 2315  01/15/14 2332  BP: 131/82 120/78 124/80 124/80  Pulse: 85  68 66  Temp:      TempSrc:      Resp: 14 13 17 14   SpO2: 97% 99% 99% 100%    Patient presenting to the ED with chest pain that started this afternoon while patient was walking her dog. Stated that the chest pain is localized to the Center for chest, pleuritic described as a gripping sensation in the last approximately 20-30 minutes. Stated that she took 2 tablets of Xanax and 4 tablet of 81 mg aspirin that relieve the pain. Stated that she was diagnosed with atrial fibrillation on 01/09/2014-reported that ever since then she's been experiencing left shoulder pain described as a gnawing sensation. 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 Stated that she normally takes her and 25 mg of aspirin daily for her atrial fibrillation. This provider reviewed patient's chart. Patient was admitted to hospital on 01/09/2014 regarding palpitations and was diagnosed with atrial fibrillation new on set. Serial troponins were performed while patient was admitted to the hospital with negative elevation. Patient was discharged home with followup with Dr. [name]. Patient was seen and assessed in ED setting yesterday regarding chest pain, left shoulder pain-chest x-ray negative for acute cardiac pulmonary disease, labs unremarkable, EKG negative findings. Alert and oriented. GCS 15. Heart rate and rhythm normal. Lungs clear to auscultation to upper lower lobes bilaterally. Radial and DP pulses 2+ bilaterally. Cap refill less than 3 seconds. Negative leg swelling or pitting edema identified. Good lung expansion. Patient is become full sentences without difficulty. Negative stridor. Full range of motion to upper lower tremors bilaterally. Strength intact with equal distribution. Equal grip strength. EKG noted normal sinus rhythm with heart rate rate of 90 beats per minute-negative ischemic findings noted, negative changes identified with comparison to previous EKG tracings. First I-STAT troponin negative elevation. Second I-STAT troponin negative elevation. CBC negative findings. CMP negative findings.  HEART score 2. Patient recently admitted to the hospital for chest pain with serial troponins that were negative - admitted on 01/09/2014. Patient reported that she has been stressed and anxious - reported that she has been working 7 days per week. Reported that the Xanax relieves the pain. Negative episodes of chest pain while in the ED setting. Patient stable, afebrile. Labs unremarkable. Chest xray from yesterday unremarkable. Patient not in afib. Discussed labs and imaging in great detail with attending physician who  agreed to plan of discharge - patient seen and assessed. Doubt PE. Doubt cardiac issue. Suspicion to be anxiety and stress related. Patient stable, afebrile. Patient not septic appearing. Discharged patient. Patient has close follow up with cardiology tomorrow. Discussed with patient to rest and stay hydrated. Discussed with patient to avoid any physical or strenuous activity - work note for [employer] duty given. Discussed with patient to closely monitor symptoms and if symptoms are to worsen or change to report back to the ED - strict return instructions given.  Patient agreed to plan of care, understood, all questions answered.   Jamse Mead, PA-C 01/16/14 [phone]

## 2014-01-15 NOTE — ED Provider Notes (Signed)
Medical screening examination/treatment/procedure(s) were performed by non-physician practitioner and as supervising physician I was immediately available for consultation/collaboration.   EKG Interpretation   Date/Time:  Monday January 14 2014 11:40:07 EDT Ventricular Rate:  73 PR Interval:  150 QRS Duration: 78 QT Interval:  368 QTC Calculation: 405 R Axis:   68 Text Interpretation:  Sinus rhythm with occasional Premature ventricular  complexes Cannot rule out Anterior infarct , age undetermined No  significant change since last tracing Confirmed by [facility]  MD, Jenny Reichmann  [phone]) on 01/14/2014 1:51:47 PM       Babette Relic, MD 01/15/14 [phone]

## 2014-01-16 ENCOUNTER — Ambulatory Visit (HOSPITAL_COMMUNITY)
Admission: RE | Admit: 2014-01-16 | Discharge: 2014-01-16 | Disposition: A | Discharge status: Home / Self Care | Payer: No Typology Code available for payment source | Source: Ambulatory Visit | Attending: Cardiovascular Disease | Admitting: Cardiovascular Disease

## 2014-01-16 DIAGNOSIS — I4891 Unspecified atrial fibrillation: Secondary | ICD-10-CM | POA: Insufficient documentation

## 2014-01-16 DIAGNOSIS — I359 Nonrheumatic aortic valve disorder, unspecified: Secondary | ICD-10-CM

## 2014-01-16 DIAGNOSIS — R079 Chest pain, unspecified: Secondary | ICD-10-CM

## 2014-01-16 MED ORDER — OMEPRAZOLE 20 MG PO CPDR: 20.0000 mg | DELAYED_RELEASE_CAPSULE | Freq: Every day | ORAL | Status: DC

## 2014-01-16 NOTE — Discharge Instructions (Signed)
Please call your doctor for a followup appointment within 24-48 hours. When you talk to your doctor please let them know that you were seen in the emergency department and have them acquire all of your records so that they can discuss the findings with you and formulate a treatment plan to fully care for your new and ongoing problems. Please keep appointment with Dr. [name] tomorrow regarding Ultrasound Please rest and stay hydrated Please continue to take medications as prescribed Please apply icy-hot ointment and massage with slow circular motions of the left shoulder  Please continue to monitor symptoms closely if symptoms are to worsen or change (fever greater than Hunter 1, chills, nausea, vomiting, chest pain, shortness of breath, difficulty breathing, numbness, tingling, weakness, worsening or changes chest pain, sweating profusely) please report back to the ED immediately   Chest Pain (Nonspecific) It is often hard to give a specific diagnosis for the cause of chest pain. There is always a chance that your pain could be related to something serious, such as a heart attack or a blood clot in the lungs. You need to follow up with your caregiver for further evaluation. CAUSES   Heartburn.  Pneumonia or bronchitis.  Anxiety or stress.  Inflammation around your heart (pericarditis) or lung (pleuritis or pleurisy).  A blood clot in the lung.  A collapsed lung (pneumothorax). It can develop suddenly on its own (spontaneous pneumothorax) or from injury (trauma) to the chest.  Shingles infection (herpes zoster virus). The chest wall is composed of bones, muscles, and cartilage. Any of these can be the source of the pain.  The bones can be bruised by injury.  The muscles or cartilage can be strained by coughing or overwork.  The cartilage can be affected by inflammation and become sore (costochondritis). DIAGNOSIS  Lab tests or other studies, such as X-rays, electrocardiography, stress  testing, or cardiac imaging, may be needed to find the cause of your pain.  TREATMENT   Treatment depends on what may be causing your chest pain. Treatment may include:  Acid blockers for heartburn.  Anti-inflammatory medicine.  Pain medicine for inflammatory conditions.  Antibiotics if an infection is present.  You may be advised to change lifestyle habits. This includes stopping smoking and avoiding alcohol, caffeine, and chocolate.  You may be advised to keep your head raised (elevated) when sleeping. This reduces the chance of acid going backward from your stomach into your esophagus.  Most of the time, nonspecific chest pain will improve within 2 to 3 days with rest and mild pain medicine. HOME CARE INSTRUCTIONS   If antibiotics were prescribed, take your antibiotics as directed. Finish them even if you start to feel better.  For the next few days, avoid physical activities that bring on chest pain. Continue physical activities as directed.  Do not smoke.  Avoid drinking alcohol.  Only take over-the-counter or prescription medicine for pain, discomfort, or fever as directed by your caregiver.  Follow your caregiver's suggestions for further testing if your chest pain does not go away.  Keep any follow-up appointments you made. If you do not go to an appointment, you could develop lasting (chronic) problems with pain. If there is any problem keeping an appointment, you must call to reschedule. SEEK MEDICAL CARE IF:   You think you are having problems from the medicine you are taking. Read your medicine instructions carefully.  Your chest pain does not go away, even after treatment.  You develop a rash with blisters on  your chest. SEEK IMMEDIATE MEDICAL CARE IF:   You have increased chest pain or pain that spreads to your arm, neck, jaw, back, or abdomen.  You develop shortness of breath, an increasing cough, or you are coughing up blood.  You have severe back or  abdominal pain, feel nauseous, or vomit.  You develop severe weakness, fainting, or chills.  You have a fever. THIS IS AN EMERGENCY. Do not wait to see if the pain will go away. Get medical help at once. Call your local emergency services (911 in [place] Do not drive yourself to the hospital. MAKE SURE YOU:   Understand these instructions.  Will watch your condition.  Will get help right away if you are not doing well or get worse. Document Released: 07/21/2005 Document Revised: 01/03/2012 Document Reviewed: 05/16/2008 [facility] Patient Information 2014 [place]. Generalized Anxiety Disorder Generalized anxiety disorder (GAD) is a mental disorder. It interferes with life functions, including relationships, work, and school. GAD is different from normal anxiety, which everyone experiences at some point in their lives in response to specific life events and activities. Normal anxiety actually helps [place] prepare for and get through these life events and activities. Normal anxiety goes away after the event or activity is over.  GAD causes anxiety that is not necessarily related to specific events or activities. It also causes excess anxiety in proportion to specific events or activities. The anxiety associated with GAD is also difficult to control. GAD can vary from mild to severe. People with severe GAD can have intense waves of anxiety with physical symptoms (panic attacks).  SYMPTOMS The anxiety and worry associated with GAD are difficult to control. This anxiety and worry are related to many life events and activities and also occur more days than not for 6 months or longer. People with GAD also have three or more of the following symptoms (one or more in children):  Restlessness.   Fatigue.  Difficulty concentrating.   Irritability.  Muscle tension.  Difficulty sleeping or unsatisfying sleep. DIAGNOSIS GAD is diagnosed through an assessment by your caregiver. Your caregiver  will ask you questions aboutyour mood,physical symptoms, and events in your life. Your caregiver may ask you about your medical history and use of alcohol or drugs, including prescription medications. Your caregiver may also do a physical exam and blood tests. Certain medical conditions and the use of certain substances can cause symptoms similar to those associated with GAD. Your caregiver may refer you to a mental health specialist for further evaluation. TREATMENT The following therapies are usually used to treat GAD:   Medication Antidepressant medication usually is prescribed for long-term daily control. Antianxiety medications may be added in severe cases, especially when panic attacks occur.   Talk therapy (psychotherapy) Certain types of talk therapy can be helpful in treating GAD by providing support, education, and guidance. A form of talk therapy called cognitive behavioral therapy can teach you healthy ways to think about and react to daily life events and activities.  Stress managementtechniques These include yoga, meditation, and exercise and can be very helpful when they are practiced regularly. A mental health specialist can help determine which treatment is best for you. Some people see improvement with one therapy. However, other people require a combination of therapies. Document Released: 02/05/2013 Document Reviewed: 02/05/2013 [facility] Patient Information 2014 [place], [place].

## 2014-01-16 NOTE — Progress Notes (Signed)
2D Echocardiogram has been performed. 

## 2014-01-17 ENCOUNTER — Ambulatory Visit: Payer: Self-pay | Admitting: Family Medicine

## 2014-01-17 ENCOUNTER — Ambulatory Visit (HOSPITAL_COMMUNITY): Payer: No Typology Code available for payment source

## 2014-01-17 ENCOUNTER — Ambulatory Visit (HOSPITAL_COMMUNITY)
Admission: RE | Admit: 2014-01-17 | Discharge: 2014-01-17 | Disposition: A | Discharge status: Home / Self Care | Payer: No Typology Code available for payment source | Source: Ambulatory Visit | Attending: Cardiology | Admitting: Cardiology

## 2014-01-17 VITALS — BP 110/68 | HR 62 | Temp 97.9°F | Resp 18 | Ht 68.5 in | Wt 181.0 lb

## 2014-01-17 DIAGNOSIS — R059 Cough, unspecified: Secondary | ICD-10-CM

## 2014-01-17 DIAGNOSIS — J069 Acute upper respiratory infection, unspecified: Secondary | ICD-10-CM

## 2014-01-17 DIAGNOSIS — R079 Chest pain, unspecified: Secondary | ICD-10-CM | POA: Insufficient documentation

## 2014-01-17 DIAGNOSIS — R05 Cough: Secondary | ICD-10-CM

## 2014-01-17 DIAGNOSIS — I4891 Unspecified atrial fibrillation: Secondary | ICD-10-CM

## 2014-01-17 MED ORDER — TECHNETIUM TC 99M SESTAMIBI GENERIC - CARDIOLITE
30.5000 | Freq: Once | INTRAVENOUS | Status: AC | PRN
  Administered 2014-01-17: 31 via INTRAVENOUS

## 2014-01-17 MED ORDER — HYDROCOD POLST-CHLORPHEN POLST 10-8 MG/5ML PO LQCR: 5.0000 mL | Freq: Two times a day (BID) | ORAL | Status: DC | PRN

## 2014-01-17 MED ORDER — TECHNETIUM TC 99M SESTAMIBI GENERIC - CARDIOLITE
9.8000 | Freq: Once | INTRAVENOUS | Status: AC | PRN
  Administered 2014-01-17: 10 via INTRAVENOUS

## 2014-01-17 MED ORDER — AMOXICILLIN 875 MG PO TABS: 875.0000 mg | ORAL_TABLET | Freq: Two times a day (BID) | ORAL | Status: DC

## 2014-01-17 NOTE — Progress Notes (Signed)
   Subjective:    Patient ID: [name], female    DOB: [birth year], 53 y.o.   MRN: [id number]  HPI Patient with 2 month history of cough. Has recently been admitted with afib with pharmacological conversion and hospitalization. Had negative treadmill today (was able to fully exercise). Had negative chest xray yesterday.  Has noticed that sputum has turned thick and yellow. Sleeping well, cough worse with moving around. Was given Claritin at hospital discharge- did not take. Has flonase at home- thought this was an intermittent treatment. Has been using neo synephrine  nasal spray, "probably too much." Is not currently using every day. 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 Both of her parents died from CVD, both were smokers. She has been very concerned that she will have a heart attack.  Three months ago was unplugging lights in wet grass and was shocked. Did not loose consciousness. Was evaluated by EMS. No symptoms of this now.  Review of Systems No fever, no shortness of breath, no wheezing, + post nasal drainage, no ear pain.    Objective:   Physical Exam  Vitals reviewed. Constitutional: She appears well-developed and well-nourished. No distress.  HENT:  Head: Normocephalic and atraumatic.  Right Ear: Tympanic membrane, external ear and ear canal normal.  Left Ear: Tympanic membrane, external ear and ear canal normal.  Nose: Mucosal edema, rhinorrhea and septal deviation present. Right sinus exhibits no maxillary sinus tenderness and no frontal sinus tenderness. Left sinus exhibits no maxillary sinus tenderness and no frontal sinus tenderness.  Eyes: Conjunctivae are normal. Right eye exhibits no discharge. Left eye exhibits no discharge.  Neck: Normal range of motion. Neck supple.  Cardiovascular: Normal rate, regular rhythm and normal heart sounds.   Pulmonary/Chest: Effort normal and breath sounds normal. She exhibits no tenderness.  Musculoskeletal:  Normal range of motion.  Lymphadenopathy:    She has no cervical adenopathy.  Skin: Skin is warm and dry. She is not diaphoretic.  Ecchymosis right antecubital area from recent IV. Left hand with resolving ecchymosis from recent IV.  Psychiatric: She has a normal mood and affect. Her behavior is normal. Judgment and thought content normal.      Assessment & Plan:  1. Upper respiratory infection - amoxicillin (AMOXIL) 875 MG tablet; Take 1 tablet (875 mg total) by mouth 2 (two) times daily.  Dispense: 20 tablet; Refill: 0 -reassured her that she can take Claritin. Encouraged Flonase use daily.  2. Cough - chlorpheniramine-HYDROcodone (TUSSIONEX PENNKINETIC ER) 10-8 MG/5ML LQCR; Take 5 mLs by mouth every 12 (twelve) hours as needed for cough (cough).  Dispense: 70 mL; Refill: 0  Reassured patient of negative cardiac work up. Encouraged patient to rest and recover from recent stressors.  Elby Beck, FNP-BC  [facility]  01/17/2014 5:32 PM

## 2014-01-17 NOTE — ED Provider Notes (Signed)
Medical screening examination/treatment/procedure(s) were conducted as a shared visit with non-physician practitioner(s) or resident  and myself.  I personally evaluated the patient during the encounter and agree with the findings and plan unless otherwise indicated.    I have personally reviewed any xrays and/ or EKG's with the provider and I agree with interpretation.   Pt developed chest pain after being upset, pt has had this numerous times in the past.  No pain or sob on my exam.  RRR, no leg swelling, well appearing, no distress, lungs clear.  Sxs resolved with time and after benzodiazepines.  Worse with stress and movement. Very low concern for PE or acute cardiac at this time. EKG no acute findings. Troponins negative.  Pt has appointment with cardiology tomorrow and feels comfortable discussing further at that time.    EKG Interpretation   Date/Time:  Tuesday January 15 2014 17:35:42 EDT Ventricular Rate:  69 PR Interval:  124 QRS Duration: 82 QT Interval:  376 QTC Calculation: 402 R Axis:   62 Text Interpretation:  Normal sinus rhythm Normal ECG Confirmed by Reather Converse   MD, Havoc Sanluis ([id number]) on 01/15/2014 9:50:49 PM       Chest pain    Mariea Clonts, MD 01/17/14 1300

## 2014-01-17 NOTE — Procedures (Addendum)
[place] [address] [place] [address] [phone]  Cardiology Nuclear Med Study  [name] is a 53 y.o. female     MRN : [id number]     DOB: [birth year]  Procedure Date: 01/17/2014  Nuclear Med Background Indication for Stress Test:  Evaluation for Ischemia, [facility] and Abnormal EKG History:  PAF-new onset Cardiac Risk Factors: Family History - CAD and Overweight  Symptoms:  Chest Pain, DOE, Fatigue, Palpitations and PAF   Nuclear Pre-Procedure Caffeine/Decaff Intake:  9:00pm NPO After: 7:00am   IV Site: R Forearm  IV 0.9% NS with Angio Cath:  22g  Chest Size (in):  n/a IV Started by: Azucena Cecil, RN  Height: 5\' 8"  (1.727 m)  Cup Size: D  BMI:  Body mass index is 26.61 kg/(m^2). Weight:  175 lb (79.379 kg)   Tech Comments:  n/a    Nuclear Med Study 1 or 2 day study: 1 day  Stress Test Type:  Stress  Order Authorizing Provider:  Lyman Bishop, MD   Resting Radionuclide: Technetium 21m Sestamibi  Resting Radionuclide Dose: 9.8 mCi   Stress Radionuclide:  Technetium 91m Sestamibi  Stress Radionuclide Dose: 30.5 mCi           Stress Protocol Rest HR: 70 Stress HR: 146  Rest BP: 119/91 Stress BP: 174/76  Exercise Time (min): 8 METS: 10.1   Predicted Max HR: 169 bpm % Max HR: 86.39 bpm Rate Pressure Product: 01601  Dose of Adenosine (mg):  n/a Dose of Lexiscan: n/a mg  Dose of Atropine (mg): n/a Dose of Dobutamine: n/a mcg/kg/min (at max HR)  Stress Test Technologist: Leane Para, CCT Nuclear Technologist: Imagene Riches, CNMT   Rest Procedure:  Myocardial perfusion imaging was performed at rest 45 minutes following the intravenous administration of Technetium 64m Sestamibi. Stress Procedure:  The patient performed treadmill exercise using a Bruce  Protocol for 8 minutes. The patient stopped due to  SOB, fatigue and denied any chest pain.  There were no significant ST-T wave changes.   Technetium 86m Sestamibi was injected IV at peak exercise and myocardial perfusion imaging was performed after a brief delay.  Transient Ischemic Dilatation (Normal <1.22):  0.81 Lung/Heart Ratio (Normal <0.45):  0.29 QGS EDV:  83 ml QGS ESV:  27 ml LV Ejection Fraction: 67%     Rest ECG: NSR - Normal EKG  Stress ECG: No significant change from baseline ECG  QPS Raw Data Images:  Normal; no motion artifact; normal heart/lung ratio. Stress Images:  Normal homogeneous uptake in all areas of the myocardium. Rest Images:  Normal homogeneous uptake in all areas of the myocardium. Subtraction (SDS):  Normal  Impression Exercise Capacity:  Good exercise capacity. BP Response:  Normal blood pressure response. Clinical Symptoms:  Mild chest pain/dyspnea. ECG Impression:  No significant ST segment change suggestive of ischemia. Comparison with Prior Nuclear Study: No images to compare  Overall Impression:  Normal stress nuclear study.  LV Wall Motion:  NL LV Function, 67%; NL Wall Motion   KELLY,THOMAS A, MD  01/17/2014 11:59 AM

## 2014-01-21 NOTE — Progress Notes (Signed)
I have discussed this case with Ms. Gessner, NP and agree.  

## 2014-01-22 ENCOUNTER — Encounter (HOSPITAL_COMMUNITY): Payer: No Typology Code available for payment source

## 2014-01-25 ENCOUNTER — Encounter: Payer: Self-pay | Admitting: Internal Medicine

## 2014-01-25 ENCOUNTER — Ambulatory Visit: Payer: No Typology Code available for payment source | Attending: Internal Medicine | Admitting: Internal Medicine

## 2014-01-25 VITALS — BP 120/85 | HR 68 | Resp 16 | Ht 68.0 in | Wt 180.0 lb

## 2014-01-25 DIAGNOSIS — K219 Gastro-esophageal reflux disease without esophagitis: Secondary | ICD-10-CM | POA: Insufficient documentation

## 2014-01-25 DIAGNOSIS — R0989 Other specified symptoms and signs involving the circulatory and respiratory systems: Secondary | ICD-10-CM | POA: Insufficient documentation

## 2014-01-25 DIAGNOSIS — Z0000 Encounter for general adult medical examination without abnormal findings: Secondary | ICD-10-CM

## 2014-01-25 DIAGNOSIS — R0789 Other chest pain: Secondary | ICD-10-CM | POA: Insufficient documentation

## 2014-01-25 DIAGNOSIS — Z09 Encounter for follow-up examination after completed treatment for conditions other than malignant neoplasm: Secondary | ICD-10-CM | POA: Insufficient documentation

## 2014-01-25 DIAGNOSIS — I4891 Unspecified atrial fibrillation: Secondary | ICD-10-CM | POA: Insufficient documentation

## 2014-01-25 DIAGNOSIS — J209 Acute bronchitis, unspecified: Secondary | ICD-10-CM | POA: Insufficient documentation

## 2014-01-25 LAB — POCT GLYCOSYLATED HEMOGLOBIN (HGB A1C): Hemoglobin A1C: 5.3

## 2014-01-25 MED ORDER — LEVALBUTEROL TARTRATE 45 MCG/ACT IN AERO: 1.0000 | INHALATION_SPRAY | Freq: Three times a day (TID) | RESPIRATORY_TRACT | Status: DC | PRN

## 2014-01-25 MED ORDER — ALBUTEROL SULFATE HFA 108 (90 BASE) MCG/ACT IN AERS: 2.0000 | INHALATION_SPRAY | Freq: Four times a day (QID) | RESPIRATORY_TRACT | Status: DC | PRN

## 2014-01-25 MED ORDER — DICLOFENAC SODIUM 1 % TD CREA: 1.0000 "application " | TOPICAL_CREAM | Freq: Two times a day (BID) | TRANSDERMAL | Status: DC | PRN

## 2014-01-25 NOTE — Progress Notes (Signed)
Went to the ED on 01/09/14

## 2014-01-25 NOTE — Progress Notes (Signed)
Patient ID: [name], female   DOB: [birth year], 53 y.o.   MRN: [id number]   CC:  HPI: 53 year old female here for followup from urgent care. The patient fell sick in January with the flu after receiving flu vaccination. She was treated with doxycycline at that time. Subsequently continued to have persistent worsening cough and shortness of breath. Started developing left-sided chest pain, was admitted 3/18 with chest pain, paroxysmal atrial fibrillation converted with diltiazem, ruled out for negative troponin, d-dimer, normal 2-D echo. Also had a nuclear stress test that was negative. Has been on Toprol for her atrial fibrillation For her acute bronchitis the patient received a second course of antibiotics with amoxicillin, was started on Flonase and Claritin. Patient has 2 more days of amoxicillin left, denies any shortness of breath. Her sputum has cleared up. She denies any wheezing or chest pain. She still has some residual left shoulder discomfort.    Allergies  Allergen Reactions  . Sausage [Pickled Meat] Other (See Comments)    Causes migraines  . Sulfamethoxazole Other (See Comments)    flushing   Past Medical History  Diagnosis Date  . Allergy   . Anemia   . GERD (gastroesophageal reflux disease)   . Migraines   . Shingles   . Atrial fibrillation   . Atrial fibrillation    Current Outpatient Prescriptions on File Prior to Visit  Medication Sig Dispense Refill  . ALPRAZolam (XANAX) 0.25 MG tablet Take 0.25 mg by mouth 3 (three) times daily as needed for anxiety.      Marland Kitchen amoxicillin (AMOXIL) 875 MG tablet Take 1 tablet (875 mg total) by mouth 2 (two) times daily.  20 tablet  0  . ascorbic acid (VITAMIN C) 500 MG tablet Take 500 mg by mouth 2 (two) times daily.      . Cyanocobalamin (B-12 PO) Take 1 tablet by mouth daily.      . ferrous sulfate 325 (65 FE) MG EC tablet Take 325 mg by mouth daily with breakfast.      . fluticasone (FLONASE) 50 MCG/ACT nasal spray  Place 2 sprays into both nostrils daily as needed for allergies or rhinitis.      Marland Kitchen loratadine (CLARITIN) 10 MG tablet Take 1 tablet (10 mg total) by mouth daily as needed for allergies.  30 tablet  2  . Magnesium 400 MG CAPS Take 1 capsule by mouth at bedtime.       . metoprolol succinate (TOPROL-XL) 12.5 mg TB24 24 hr tablet Take 0.5 tablets (12.5 mg total) by mouth daily.  30 tablet  4  . Probiotic Product (PROBIOTIC FORMULA PO) Take 1 tablet by mouth daily.       Marland Kitchen aspirin 81 MG tablet Take 81 mg by mouth daily as needed for pain.      . chlorpheniramine-HYDROcodone (TUSSIONEX PENNKINETIC ER) 10-8 MG/5ML LQCR Take 5 mLs by mouth every 12 (twelve) hours as needed for cough (cough).  70 mL  0   No current facility-administered medications on file prior to visit.   Family History  Problem Relation Age of Onset  . Heart disease Mother   . Cancer Father 52    lung ca  . Heart disease Father 31    mi/cabg  . Colon cancer Cousin 30   History   Social History  . Marital Status: Single    Spouse Name: N/A    Number of Children: N/A  . Years of Education: N/A   Occupational History  .  Not on file.   Social History Main Topics  . Smoking status: Never Smoker   . Smokeless tobacco: Never Used  . Alcohol Use: No  . Drug Use: No  . Sexual Activity: Not on file   Other Topics Concern  . Not on file   Social History Narrative  . No narrative on file    Review of Systems  Constitutional: Negative for fever, chills, diaphoresis, activity change, appetite change and fatigue.  HENT: Negative for ear pain, nosebleeds, congestion, facial swelling, rhinorrhea, neck pain, neck stiffness and ear discharge.   Eyes: Negative for pain, discharge, redness, itching and visual disturbance.  Respiratory: As in history of present illness  Cardiovascular: Negative for chest pain, palpitations and leg swelling.  Gastrointestinal: Negative for abdominal distention.  Genitourinary: Negative for  dysuria, urgency, frequency, hematuria, flank pain, decreased urine volume, difficulty urinating and dyspareunia.  Musculoskeletal: Negative for back pain, joint swelling, arthralgias and gait problem.  Neurological: Negative for dizziness, tremors, seizures, syncope, facial asymmetry, speech difficulty, weakness, light-headedness, numbness and headaches.  Hematological: Negative for adenopathy. Does not bruise/bleed easily.  Psychiatric/Behavioral: Negative for hallucinations, behavioral problems, confusion, dysphoric mood, decreased concentration and agitation.    Objective:   Filed Vitals:   01/25/14 1013  BP: 120/85  Pulse: 68  Resp: 16    Physical Exam  Constitutional: Appears well-developed and well-nourished. No distress.  HENT: Normocephalic. External right and left ear normal. Oropharynx is clear and moist.  Eyes: Conjunctivae and EOM are normal. PERRLA, no scleral icterus.  Neck: Normal ROM. Neck supple. No JVD. No tracheal deviation. No thyromegaly.  CVS: RRR, S1/S2 +, no murmurs, no gallops, no carotid bruit.  Pulmonary: Effort and breath sounds normal, no stridor, rhonchi, wheezes, rales.  Abdominal: Soft. BS +,  no distension, tenderness, rebound or guarding.  Musculoskeletal: Normal range of motion. No edema and no tenderness.  Lymphadenopathy: No lymphadenopathy noted, cervical, inguinal. Neuro: Alert. Normal reflexes, muscle tone coordination. No cranial nerve deficit. Skin: Skin is warm and dry. No rash noted. Not diaphoretic. No erythema. No pallor.  Psychiatric: Normal mood and affect. Behavior, judgment, thought content normal.   Lab Results  Component Value Date   WBC 8.4 01/15/2014   HGB 12.3 01/15/2014   HCT 35.8* 01/15/2014   MCV 90.4 01/15/2014   PLT 268 01/15/2014   Lab Results  Component Value Date   CREATININE 0.67 01/15/2014   BUN 21 01/15/2014   NA 138 01/15/2014   K 4.0 01/15/2014   CL 99 01/15/2014   CO2 26 01/15/2014    Lab Results  Component  Value Date   HGBA1C 4.8 09/05/2013   Lipid Panel     Component Value Date/Time   CHOL 204* 09/05/2013 1042   TRIG 70 09/05/2013 1042   HDL 73 09/05/2013 1042   CHOLHDL 2.8 09/05/2013 1042   VLDL 14 09/05/2013 1042   LDLCALC 117* 09/05/2013 1042       Assessment and plan:   Patient Active Problem List   Diagnosis Date Noted  . Atrial fibrillation 01/09/2014  . Atrial fibrillation with RVR 01/09/2014  . Chest pain 01/09/2014  . A-fib 01/09/2014  . OTHER AND UNSPECIFIED OVARIAN CYST 09/08/2010  . GERD 08/10/2010  . ANXIETY STATE NOS 02/02/2010   Acute bronchitis Resolving Started amoxicillin 3/26, 875 mg twice a day for 10 days She has 2 more days of antibiotics left Still complains of some intermittent dyspnea and chest tightness Will prescribe Ventolin as a rescue inhaler Continue Flonase  Tussionex Claritin  Follow up when necessary   Atrial fibrillation Tolerating Toprol XL Continue aspirin Closely followed by cardiology outpatient  Patient to follow up in 3 months for 30 minute appointment for routine physical      The patient was given clear instructions to go to ER or return to medical center if symptoms don't improve, worsen or new problems develop. The patient verbalized understanding. The patient was told to call to get any lab results if not heard anything in the next week.

## 2014-01-25 NOTE — Progress Notes (Signed)
Patient here to follow-up for chest congestion. Went to Urgent Care 8 days ago and was prescribed amoxicillin.  Patient has two days left of medication Patient also has been to the ED 3x in the last month-first on 01/09/14 for atrial fibrillation and the two other visits with arm pain. Heart attack ruled out. Patient with pain 2/10 in left arm that she describes as a dull pain and is intermittent.

## 2014-01-25 NOTE — Addendum Note (Signed)
Addended by: Allyson Sabal MD, Ascencion Dike on: 01/25/2014 02:37 PM   Modules accepted: Orders, Medications

## 2014-01-29 ENCOUNTER — Ambulatory Visit (INDEPENDENT_AMBULATORY_CARE_PROVIDER_SITE_OTHER): Payer: No Typology Code available for payment source | Admitting: Internal Medicine

## 2014-01-29 ENCOUNTER — Encounter: Payer: Self-pay | Admitting: Internal Medicine

## 2014-01-29 DIAGNOSIS — R079 Chest pain, unspecified: Secondary | ICD-10-CM

## 2014-01-29 DIAGNOSIS — F411 Generalized anxiety disorder: Secondary | ICD-10-CM

## 2014-01-29 DIAGNOSIS — I351 Nonrheumatic aortic (valve) insufficiency: Secondary | ICD-10-CM | POA: Insufficient documentation

## 2014-01-29 DIAGNOSIS — I4891 Unspecified atrial fibrillation: Secondary | ICD-10-CM

## 2014-01-29 DIAGNOSIS — I359 Nonrheumatic aortic valve disorder, unspecified: Secondary | ICD-10-CM

## 2014-01-29 NOTE — Progress Notes (Signed)
OFFICE NOTE  Chief Complaint:  Hospital follow-up, chest pain, a-fib  Primary Care Physician: Lorayne Marek, MD  HPI:  [name] is a 53 y.o. female with a past medical history significant for migraines, allergic rhinitis, and GERD, but a strong family history of CAD and aneurysm. 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 Since the palpitations became persistent patient came to the ER. In the ER patient was found to be in A. fib with RVR and Cardizem 20 mg IV bolus was given after which patient's heart rate decreased but still in A. fib. Patient also after coming to the ER started developing some chest pressure for which patient was placed on nitroglycerin patch following which patient symptoms resolved. Shortly after she converted to sinus rhythm. She did have an extensive cardiac work-up by Dr. Pauline Aus (who is a retired cardiologist) about 15 years ago. During her hospitalization she spontaneously converted to sinus rhythm. She is placed on low-dose beta blocker and aspirin due to her low CHADSVASC score of 0. Outpatient stress testing was recommended due to her chest pain.  Unfortunately she was seen twice with recurrent chest pain symptoms. Some of which may be due to anxiety as it was relieved with a Xanax. 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 She also reports pain that starts from the neck and goes down the left arm.  She is not aware of recurrent atrial fibrillation.  PMHx:  Past Medical History  Diagnosis Date  . Allergy   . Anemia   . GERD (gastroesophageal reflux disease)   . Migraines   . Shingles   . Atrial fibrillation     Past Surgical History  Procedure Laterality Date  . Ovarian cyst removal  1995    dr Edwyna Ready    FAMHx:  Family History  Problem Relation Age of Onset  . Heart  disease Mother   . Cancer Father 24    lung ca  . Heart disease Father 44    mi/cabg  . Colon cancer Cousin 30    SOCHx:   reports that she has never smoked. She has never used smokeless tobacco. She reports that she does not drink alcohol or use illicit drugs.  ALLERGIES:  Allergies  Allergen Reactions  . Sausage [Pickled Meat] Other (See Comments)    Causes migraines  . Sulfamethoxazole Other (See Comments)    flushing    ROS: A comprehensive review of systems was negative except for: Cardiovascular: positive for chest pain  HOME MEDS: Current Outpatient Prescriptions  Medication Sig Dispense Refill  . ALPRAZolam (XANAX) 0.25 MG tablet Take 0.25 mg by mouth 3 (three) times daily as needed for anxiety.      Marland Kitchen ascorbic acid (VITAMIN C) 500 MG tablet Take 500 mg by mouth 2 (two) times daily.      Marland Kitchen aspirin 81 MG tablet Take 81 mg by mouth daily.      . Diclofenac Sodium 1 % CREA Place 1 application onto the skin 2 (two) times daily as needed.  120 g  1  . ferrous sulfate 325 (65 FE) MG EC tablet Take 325 mg by mouth daily with breakfast.      . fluticasone (FLONASE) 50 MCG/ACT nasal spray Place 2 sprays into both nostrils daily as needed for allergies or  rhinitis.      Marland Kitchen levalbuterol (XOPENEX HFA) 45 MCG/ACT inhaler Inhale 1 puff into the lungs every 8 (eight) hours as needed for wheezing or shortness of breath.  1 Inhaler  12  . Magnesium 400 MG CAPS Take 1 capsule by mouth 2 (two) times daily.       . metoprolol succinate (TOPROL-XL) 12.5 mg TB24 24 hr tablet Take 12.5 mg by mouth at bedtime.      . Probiotic Product (PROBIOTIC FORMULA PO) Take 1 tablet by mouth daily.        No current facility-administered medications for this visit.    LABS/IMAGING: No results found for this or any previous visit (from the past 48 hour(s)). No results found.  VITALS: LMP 12/05/2013  EXAM: General appearance: alert and no distress Neck: no adenopathy, no carotid bruit, no JVD,  supple, symmetrical, trachea midline and thyroid not enlarged, symmetric, no tenderness/mass/nodules Lungs: clear to auscultation bilaterally Heart: regular rate and rhythm, S1, S2 normal, no murmur, click, rub or gallop Abdomen: soft, non-tender; bowel sounds normal; no masses,  no organomegaly Extremities: extremities normal, atraumatic, no cyanosis or edema Pulses: 2+ and symmetric Skin: Skin color, texture, turgor normal. No rashes or lesions  EKG: Normal sinus rhythm at 70  ASSESSMENT: 1. Paroxysmal atrial fibrillation 2. Anxiety 3. Atypical chest pain - negative NST 4. Mild AI  PLAN: 1.   Ms. Wass paroxysmal atrial fibrillation without a clear cause. She has undergone stress testing as well as an echocardiogram. That showed preserved ejection fraction with mild aortic insufficiency, otherwise no other significant abnormalities. Her stress test was nonischemic. There is no concern for sleep apnea. Her father also had atrial fibrillation with a similar age of onset. She has subsequently stopped using alcohol and caffeine and is working on decreasing stress and improving sleep in her life. All of this may help however she is at high risk for recurrence. For now given her low CHADSVASC score, I would recommend switching to low-dose aspirin 81 mg daily after one month of full dose aspirin. She is also hard about using nonsteroidals for her left arm pain which is I think a good place to start. If this is not improved, she may wish to see an orthopedic spine specialist.  Plan to see her back in 1 year.  Pixie Casino, MD, [facility] Attending Cardiologist [facility]  Ivis Nicolson C 01/29/2014, 6:29 PM

## 2014-01-29 NOTE — Patient Instructions (Signed)
Your physician wants you to follow-up in: 1 year. You will receive a reminder letter in the mail two months in advance. If you don't receive a letter, please call our office to schedule the follow-up appointment.  Decrease aspirin to 81mg  once daily.

## 2014-01-31 ENCOUNTER — Ambulatory Visit: Payer: No Typology Code available for payment source | Admitting: Internal Medicine

## 2014-02-01 ENCOUNTER — Telehealth: Payer: Self-pay | Admitting: *Deleted

## 2014-02-01 NOTE — Telephone Encounter (Signed)
Left VM with echo results (Echo is ok .Marland Kitchen Only mild aortic insufficiency, EF normal)

## 2014-02-02 ENCOUNTER — Telehealth: Payer: Self-pay | Admitting: Physician Assistant

## 2014-02-02 NOTE — Telephone Encounter (Signed)
[name] is a 53 y.o. female with a history of paroxysmal atrial fibrillation. She called the answering service with elevated heart rates. She feels that she is back in atrial fibrillation. Her blood pressure machine recorded her heart rate at 98. Blood pressure is 126/77. She denies chest pain, dyspnea or near syncope. I advised the patient to take an extra Toprol 25 mg now and tomorrow she can continue taking 12.5 mg twice a day. I will leave a message at the office for the patient to be brought in for follow up this week. If she feels poorly or feels her heart rate is significantly elevated, she has been advised to go to the emergency room for further evaluation. Richardson Dopp, PA-C   02/02/2014 4:03 PM

## 2014-02-04 ENCOUNTER — Encounter: Payer: Self-pay | Admitting: Physician Assistant

## 2014-02-04 ENCOUNTER — Ambulatory Visit (INDEPENDENT_AMBULATORY_CARE_PROVIDER_SITE_OTHER): Payer: No Typology Code available for payment source | Admitting: Physician Assistant

## 2014-02-04 VITALS — BP 118/80 | HR 70 | Ht 68.0 in | Wt 179.0 lb

## 2014-02-04 DIAGNOSIS — I4891 Unspecified atrial fibrillation: Secondary | ICD-10-CM

## 2014-02-04 NOTE — Patient Instructions (Addendum)
1.  Massage:   [id number].  Sonia Baller 2.  Follow up with Dr. [name] in a year. 3.  If your HR is irregular and consitantly over a 100, take and extra half of Toprol XL.

## 2014-02-04 NOTE — Assessment & Plan Note (Addendum)
Maintaining normal sinus rhythm rate 70 beats per minute.  Continues on metoprolol succinate 12.5 mg twice daily. May take an extra half dose she notices an elevated heart rate and symptomatic. Blood pressure is well controlled.

## 2014-02-04 NOTE — Progress Notes (Signed)
Date:  02/04/2014   ID:  Amber Millet, DOB 16-Mar-1961, MRN [id number]  PCP:  Lorayne Marek, MD  Primary Cardiologist:  [facility]     History of Present Illness: [name] is a 53 y.o. female with a past medical history significant for migraines, allergic rhinitis, and GERD, but a strong family history of CAD and aneurysm. 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 Since the palpitations became persistent patient came to the ER. In the ER patient was found to be in A. fib with RVR and Cardizem 20 mg IV bolus was given after which patient's heart rate decreased but still in A. fib. Patient also after coming to the ER started developing some chest pressure for which patient was placed on nitroglycerin patch following which patient symptoms resolved. Shortly after she converted to sinus rhythm. She did have an extensive cardiac work-up by Dr. Pauline Aus (who is a retired cardiologist) about 15 years ago. During her hospitalization she spontaneously converted to sinus rhythm. She is placed on low-dose beta blocker and aspirin due to her low CHADSVASC score of 0. Outpatient stress testing was recommended due to her chest pain. Unfortunately she was seen twice with recurrent chest pain symptoms. Some of which may be due to anxiety as it was relieved with a Xanax. 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 She also reports pain that starts from the neck and goes down the left arm. She is not aware of recurrent atrial fibrillation.  Patient was just seen by Dr. [name] on April 7.  He put her on aspirin given her low CHADSVASC score.  She presents today for followup of some heart fluttering right after eating some chest discomfort on Saturday. She denies any dizziness or shortness of breath at that time.  She does admit to some  anxiety after her initial episode of atrial flutter fibrillation going to the emergency room.  The patient currently denies nausea, vomiting, fever, shortness of breath, orthopnea, dizziness, PND, cough, congestion, abdominal pain, hematochezia, melena, lower extremity edema, claudication.  Wt Readings from Last 3 Encounters:  02/04/14 179 lb (81.194 kg)  01/25/14 180 lb (81.647 kg)  01/17/14 181 lb (82.101 kg)     Past Medical History  Diagnosis Date  . Allergy   . Anemia   . GERD (gastroesophageal reflux disease)   . Migraines   . Shingles   . Atrial fibrillation     Current Outpatient Prescriptions  Medication Sig Dispense Refill  . ALPRAZolam (XANAX) 0.25 MG tablet Take 0.25 mg by mouth 3 (three) times daily as needed for anxiety.      Marland Kitchen ascorbic acid (VITAMIN C) 500 MG tablet Take 500 mg by mouth 2 (two) times daily.      Marland Kitchen aspirin 81 MG tablet Take 81 mg by mouth daily.      . Diclofenac Sodium 1 % CREA Place 1 application onto the skin 2 (two) times daily as needed.  120 g  1  . ferrous sulfate 325 (65 FE) MG EC tablet Take 325 mg by mouth daily with breakfast.      . fluticasone (FLONASE) 50 MCG/ACT nasal spray Place 2 sprays into both nostrils daily as needed for allergies or rhinitis.      Marland Kitchen levalbuterol (XOPENEX HFA) 45 MCG/ACT inhaler Inhale  1 puff into the lungs every 8 (eight) hours as needed for wheezing or shortness of breath.  1 Inhaler  12  . Magnesium 400 MG CAPS Take 1 capsule by mouth 2 (two) times daily.       . metoprolol succinate (TOPROL-XL) 12.5 mg TB24 24 hr tablet Take 12.5 mg by mouth at bedtime.      . Probiotic Product (PROBIOTIC FORMULA PO) Take 1 tablet by mouth daily.        No current facility-administered medications for this visit.    Allergies:    Allergies  Allergen Reactions  . Sausage [Pickled Meat] Other (See Comments)    Causes migraines  . Sulfamethoxazole Other (See Comments)    flushing    Social History:  The patient  reports  that she has never smoked. She has never used smokeless tobacco. She reports that she does not drink alcohol or use illicit drugs.   Family history:   Family History  Problem Relation Age of Onset  . Heart disease Mother   . Cancer Father 28    lung ca  . Heart disease Father 66    mi/cabg  . Colon cancer Cousin 30    ROS:  Please see the history of present illness.  All other systems reviewed and negative.   PHYSICAL EXAM: VS:  Ht 5\' 8"  (1.727 m)  Wt 179 lb (81.194 kg)  BMI 27.22 kg/m2  LMP 12/05/2013 Well nourished, well developed, in no acute distress HEENT: Pupils are equal round react to light accommodation extraocular movements are intact.  Neck: no JVDNo cervical lymphadenopathy. Cardiac: Regular rate and rhythm without murmurs rubs or gallops. Lungs:  clear to auscultation bilaterally, no wheezing, rhonchi or rales Ext: no lower extremity edema.  2+ radial and dorsalis pedis pulses. Skin: warm and dry Neuro:  Grossly normal  EKG:  Normal sinus rhythm rate 70 beats per minute  ASSESSMENT AND PLAN:  Problem List Items Addressed This Visit   None

## 2014-02-04 NOTE — Telephone Encounter (Signed)
Message forwarded to scheduler to put patient on Bryan's schedule.

## 2014-02-04 NOTE — Telephone Encounter (Signed)
Eliezer Lofts - FYI.

## 2014-02-06 ENCOUNTER — Telehealth: Payer: Self-pay | Admitting: Internal Medicine

## 2014-02-06 NOTE — Telephone Encounter (Signed)
Need a new prescription for Omeprazole 20 mg #30. Please call to [phone].

## 2014-02-06 NOTE — Telephone Encounter (Signed)
Omeprazole not listed on active med list.    Returned call and pt verified x 2.  Pt informed message received and omeprazole not on med list.  Pt stated she was given 5 pills at the ER and told to get a refill from her doctor.  Stated she talked to [place] and the female nurse, who told her they would fill it.  Pt informed RN cannot send script w/o an order as it is not listed and not documented in the plan.  Informed Gaspar Bidding will be notified for further instructions.  Pt verbalized understanding and agreed w/ plan.   Pt stated she will try OTC for now.  Message forwarded to B. Lucia Gaskins and JC, LPN.

## 2014-02-07 ENCOUNTER — Other Ambulatory Visit: Payer: Self-pay | Admitting: Internal Medicine

## 2014-02-15 ENCOUNTER — Other Ambulatory Visit: Payer: Self-pay | Admitting: Internal Medicine

## 2014-02-18 ENCOUNTER — Telehealth: Payer: Self-pay | Admitting: Internal Medicine

## 2014-02-18 NOTE — Telephone Encounter (Signed)
Returned call and pt verified x 2.  Pt stated her mother died suddenly in her sleep in 2000/01/28 and Dr. Leanne Chang rx'd low dose Xanax.  Stated she occasionally has anxiety and Dr. [name] is aware she is taking it.  Stated Dr. Leanne Chang will not refill it w/o an appt and she wanted to know if Dr. [name] will prescribe it to keep her calm w/ the Afib.  Stated she doesn't have insurance and would have to pay out of pocket to see Dr. Leanne Chang to get the prescription.  Pt informed anti-anxiety meds are not generally rx'd in cardiology, but Dr. [name] will be notified and she will be notified w/ his response.  Pt understands Dr. [name] will not physically be in the office until Thursday.  Message forwarded to Dr. Josepha Pigg, RN.

## 2014-02-18 NOTE — Telephone Encounter (Signed)
Is asking if Dr.Hilty would prescribe her Alprazolam for anxiety because of her AFIB...Marland KitchenMarland KitchenPlease call

## 2014-02-19 ENCOUNTER — Encounter: Payer: Self-pay | Admitting: Family

## 2014-02-19 ENCOUNTER — Ambulatory Visit: Payer: No Typology Code available for payment source | Admitting: Family

## 2014-02-19 ENCOUNTER — Telehealth: Payer: Self-pay | Admitting: Internal Medicine

## 2014-02-19 NOTE — Telephone Encounter (Signed)
Pt calling for ALPRAZolam (XANAX) 0.25 MG tablet, last appt was on 01/25/14. She was recently diagnosed with afib and is feeling anxious. If [facility] is not able to fill pt uses [facility] on [address] [phone]. Please f/u with pt.

## 2014-02-20 NOTE — Telephone Encounter (Signed)
Please inform her I will not prescribe xanax.

## 2014-02-21 ENCOUNTER — Telehealth: Payer: Self-pay | Admitting: Emergency Medicine

## 2014-02-21 NOTE — Telephone Encounter (Signed)
Left message for pt to call when message received 

## 2014-02-21 NOTE — Telephone Encounter (Signed)
Pt called in requesting medication refill Alprazolam 0.25 mg tab for long term hx anxiety. Pt states she has been getting medication from previous doctors for many years, but with no insurance she is unable to see doctor. Informed pt of our policy and that she will need psychiatry referral to manage med. Please f/u

## 2014-02-21 NOTE — Telephone Encounter (Signed)
Returned call and informed pt per instructions by MD/PA.  Pt verbalized understanding and agreed w/ plan.  

## 2014-02-22 ENCOUNTER — Encounter: Payer: Self-pay | Admitting: Family Medicine

## 2014-02-22 ENCOUNTER — Telehealth: Payer: Self-pay | Admitting: *Deleted

## 2014-02-22 ENCOUNTER — Telehealth: Payer: Self-pay | Admitting: Internal Medicine

## 2014-02-22 ENCOUNTER — Ambulatory Visit: Payer: No Typology Code available for payment source | Attending: Family Medicine | Admitting: Family Medicine

## 2014-02-22 VITALS — BP 136/84 | HR 65 | Temp 98.7°F | Resp 16 | Ht 68.0 in | Wt 181.0 lb

## 2014-02-22 DIAGNOSIS — M25519 Pain in unspecified shoulder: Secondary | ICD-10-CM | POA: Insufficient documentation

## 2014-02-22 DIAGNOSIS — F411 Generalized anxiety disorder: Secondary | ICD-10-CM | POA: Insufficient documentation

## 2014-02-22 DIAGNOSIS — R0789 Other chest pain: Secondary | ICD-10-CM | POA: Insufficient documentation

## 2014-02-22 DIAGNOSIS — Z87828 Personal history of other (healed) physical injury and trauma: Secondary | ICD-10-CM | POA: Insufficient documentation
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
## 2014-02-22 NOTE — Telephone Encounter (Signed)
Please call having some symptoms after lifting something yesterday.

## 2014-02-22 NOTE — Progress Notes (Signed)
LCSW met with patient about challenges with anxiety.  Patient stated that her anxiety is triggered by what she believes are triggers of her AFib.  LCSW processed with patient plan for addressing increased heart rate and palpitations.  LCSW did some reframing with patient in order to understand fears, concerns and outlook of medical diagnosis.  Patient identified that this processing was helpful.  LCSW encouraged patient to follow up in 2-3 weeks with this LCSW for further assessment and emotional support.  Christene Lye MSW, LCSW Duration 30 minutes

## 2014-02-22 NOTE — Patient Instructions (Signed)
Start with range of motion exercising twice a day and build up to using 5 pound weights. In two months your shoulder should feel much better. Watch out for weakness and severe pain if these occur please return to the clinic.

## 2014-02-22 NOTE — Telephone Encounter (Signed)
I agree it doesn't sound cardiac.  I would have her monitor her symptoms and if they return or worsen, we can see her in the office or she could go to the ER.  Dr. Lemmie Evens

## 2014-02-22 NOTE — Assessment & Plan Note (Signed)
She might have a slight muscle strain given symptoms somewhat reproduced with resistance testing.  She seems anxious and I am not sure she is satisfied with this explanation.   I see no concerninmg findings on exam or recent xrays

## 2014-02-22 NOTE — Telephone Encounter (Signed)
Returned call and informed pt per instructions by MD/PA.  Pt verbalized understanding and agreed w/ plan.  

## 2014-02-22 NOTE — Telephone Encounter (Signed)
Patient called stating she has had pain and discomfort in her shoulder off and on since March 2015. Patient states she has talked to her cardiologist and he does not feel this is heart related. Patient scheduled an office visit for today ([id number]) at 3:45 PM. Alverda Skeans, RN

## 2014-02-22 NOTE — Telephone Encounter (Signed)
Returned call and pt verified x 2.  Stated her brother is a heart nurse and he told her she should call.  Stated she was shopping yesterday and it made the middle of her chest feel heavy.  Stated this started since she has had Afib.  Stated she put the item down and waited and it went away.  Stated she also took a half of a Xanax to calm her.  Denied pain w/ pressing in the middle of her chest, nausea or dizziness.  Stated she still feels it a little now.  Stated she did have SOB w/ this episode, but doesn't know if it was from anxiety b/c it happened afterwards.  Stated she also has a h/o pain in her L shoulder blade, but thinks it is musculoskeletal.  Stated she did mention it to Dr. [name] and no doctor has ever given her a referral to orthopedics.    Pt advised to go to ER for fast HR, CP and SOB.  Pt also informed symptoms sound like they may be musculoskeletal r/t her picking up a heavy item, but Dr. [name] will be notified for further instructions.  Pt verbalized understanding and agreed w/ plan.  Message forwarded to Dr. [name].

## 2014-02-27 ENCOUNTER — Telehealth: Payer: Self-pay | Admitting: Internal Medicine

## 2014-02-27 NOTE — Telephone Encounter (Signed)
Received page to call [name] back. She reports feeling "draggy" and having sporadic left shoulder discomfort. She also reports an occasional sensation of a "skipped heartbeat". She took her vitals and heart rate was in the high 50s, SBP in the 90s. She denied chest pain, shortness of breath, persistent palpitations, syncope. She did not want to seek emergent care in the ED and she denied red flag sx just described. I encouraged her to call back during business hours to arrange follow up with her cardiologist, Dr. [name].

## 2014-03-04 ENCOUNTER — Telehealth: Payer: Self-pay | Admitting: Internal Medicine

## 2014-03-04 DIAGNOSIS — M25519 Pain in unspecified shoulder: Secondary | ICD-10-CM

## 2014-03-04 NOTE — Telephone Encounter (Signed)
New Prob   Pt calling returning call from earlier. Please call.

## 2014-03-04 NOTE — Telephone Encounter (Signed)
The last 3 mornings her heart rate have been below 60.She is taking 12.5 mg of Metoprolol and aspirin each morning.She really feel bad and can not do anything. Also having a heaviness in her chest area and shoulder blade. Today her heart rate got up to 100.

## 2014-03-04 NOTE — Telephone Encounter (Signed)
Returned a call to patient in reference to her ? Heart rate and shoulder symptoms. She informs me that she saw her PCP on 5/1 and her symptoms has not changed. She does have anxiety about her symptoms and will have a increased heart rate when this occurs. Today when she became anxious she took half of a xanax and done some deep breathing and her symptoms "got better". She  states that her shoulder is still hurting and would like for Dr. [name] to do a orthopedic referral for her. She requested for Dr. Erin Hearing to do a referral and per patient he refused to do so. The patient also says that the PCP told her to "lift some weights" however Dr. [name] instructed her not to lift anything but did not say for how long. Informed her that I will leave a message for  Dr.Hilty but cannot guarantee that he will do the referral. He may just leave this to the descrection of Dr. Erin Hearing. She understands this and will wait for a return call. Getting a true sense of why the patient called other than to ask for a referral was confusing. Her symptoms (according to her) hasn't changed since seeing any provider.

## 2014-03-04 NOTE — Telephone Encounter (Signed)
Ambulatory ortho referral placed into epic. Staff message sent to [facility] to schedule.
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
## 2014-03-04 NOTE — Telephone Encounter (Signed)
Dr. [name] please read and address. See Dr. Erin Hearing 02/22/14 office dictation.

## 2014-03-04 NOTE — Telephone Encounter (Signed)
Left message Dr. [name] okay with ortho referral. We will refer across the hall to [facility] orthopedics. Continue all a-fib meds as directed. Call office if questions or concerns.

## 2014-03-04 NOTE — Telephone Encounter (Signed)
Message left on patient's phone

## 2014-03-05 ENCOUNTER — Telehealth: Payer: Self-pay | Admitting: Internal Medicine

## 2014-03-05 NOTE — Telephone Encounter (Signed)
Faxed referral to [facility] orthopaedic

## 2014-03-05 NOTE — Telephone Encounter (Signed)
[facility] does not accept the [employer] or self pay patients.  I was told to have patient call Robinette Haines the referral person for [place]. [phone].  Called and sw patient and she will call Lynn Ito to try to get appt with orthopaedic.

## 2014-03-15 ENCOUNTER — Other Ambulatory Visit: Payer: No Typology Code available for payment source

## 2014-04-02 ENCOUNTER — Other Ambulatory Visit: Payer: Self-pay | Admitting: Internal Medicine

## 2014-04-25 ENCOUNTER — Encounter: Payer: No Typology Code available for payment source | Admitting: Internal Medicine

## 2014-04-26 ENCOUNTER — Encounter: Payer: No Typology Code available for payment source | Admitting: Internal Medicine

## 2014-05-06 ENCOUNTER — Other Ambulatory Visit: Payer: Self-pay | Admitting: Internal Medicine

## 2014-05-09 ENCOUNTER — Encounter: Payer: No Typology Code available for payment source | Admitting: Internal Medicine

## 2014-05-16 ENCOUNTER — Other Ambulatory Visit (HOSPITAL_COMMUNITY)
Admission: RE | Admit: 2014-05-16 | Discharge: 2014-05-16 | Disposition: A | Discharge status: Home / Self Care | Payer: No Typology Code available for payment source | Source: Ambulatory Visit | Attending: Internal Medicine | Admitting: Internal Medicine

## 2014-05-16 ENCOUNTER — Telehealth: Payer: Self-pay | Admitting: Internal Medicine

## 2014-05-16 ENCOUNTER — Ambulatory Visit: Payer: No Typology Code available for payment source | Attending: Internal Medicine | Admitting: Internal Medicine

## 2014-05-16 ENCOUNTER — Encounter: Payer: Self-pay | Admitting: Internal Medicine

## 2014-05-16 VITALS — BP 105/69 | HR 70 | Temp 97.7°F | Resp 16 | Ht 68.5 in | Wt 183.2 lb

## 2014-05-16 DIAGNOSIS — Z01419 Encounter for gynecological examination (general) (routine) without abnormal findings: Secondary | ICD-10-CM | POA: Insufficient documentation

## 2014-05-16 DIAGNOSIS — N760 Acute vaginitis: Secondary | ICD-10-CM | POA: Insufficient documentation

## 2014-05-16 DIAGNOSIS — R232 Flushing: Secondary | ICD-10-CM | POA: Insufficient documentation

## 2014-05-16 DIAGNOSIS — Z1151 Encounter for screening for human papillomavirus (HPV): Secondary | ICD-10-CM | POA: Insufficient documentation

## 2014-05-16 DIAGNOSIS — I4891 Unspecified atrial fibrillation: Secondary | ICD-10-CM | POA: Insufficient documentation

## 2014-05-16 DIAGNOSIS — Z205 Contact with and (suspected) exposure to viral hepatitis: Secondary | ICD-10-CM

## 2014-05-16 DIAGNOSIS — Z20828 Contact with and (suspected) exposure to other viral communicable diseases: Secondary | ICD-10-CM

## 2014-05-16 DIAGNOSIS — Z124 Encounter for screening for malignant neoplasm of cervix: Secondary | ICD-10-CM

## 2014-05-16 DIAGNOSIS — Z113 Encounter for screening for infections with a predominantly sexual mode of transmission: Secondary | ICD-10-CM | POA: Insufficient documentation

## 2014-05-16 LAB — LIPID PANEL
Cholesterol: 191 mg/dL (ref 0–200)
HDL: 74 mg/dL (ref >39)
LDL Cholesterol: 103 mg/dL — ABNORMAL HIGH (ref 0–99)
Total CHOL/HDL Ratio: 2.6 Ratio
Triglycerides: 68 mg/dL (ref <150)
VLDL: 14 mg/dL (ref 0–40)

## 2014-05-16 NOTE — Progress Notes (Signed)
Patient presents for pap and breast exam. LMP 11/25/13. States having hot flashes since March 2015 Recent diagnosis of A-Fib (March 2015) Requesting lipid testing and hormone testing

## 2014-05-16 NOTE — Progress Notes (Signed)
Patient ID: [name], female   DOB: Feb 21, 1961, 53 y.o.   MRN: [id number]  CC: "Wants pap-smear and blood work"  HPI: [name] presents today wanting a pap-smear and blood work. Her last menstrual period was February 2015. In March she started experiencing hot flashes. Currently she is not sexually active last episode was six months ago. She reports having no abnormal paps and her last pap was approxiamately two years ago. She has been with her current partner for about 6-7 years. She reports that he is Hep. C positive. She reports being tested in the past for Hep C and was negative. Her last mammogram was in February 2015 and her colonoscopy was 2 years ago she was told she would not need another for 10 years.  Allergies  Allergen Reactions  . Sausage [Pickled Meat] Other (See Comments)    Causes migraines  . Sulfamethoxazole Other (See Comments)    flushing   Past Medical History  Diagnosis Date  . Allergy   . Anemia   . GERD (gastroesophageal reflux disease)   . Migraines   . Shingles   . Atrial fibrillation    Current Outpatient Prescriptions on File Prior to Visit  Medication Sig Dispense Refill  . ascorbic acid (VITAMIN C) 500 MG tablet Take 500 mg by mouth 2 (two) times daily.      Marland Kitchen aspirin 325 MG EC tablet Take 325 mg by mouth daily.      . ferrous sulfate 325 (65 FE) MG EC tablet Take 325 mg by mouth daily with breakfast.      . fluticasone (FLONASE) 50 MCG/ACT nasal spray Place 2 sprays into both nostrils daily as needed for allergies or rhinitis.      . Magnesium 400 MG CAPS Take 1 capsule by mouth 2 (two) times daily.       . metoprolol succinate (TOPROL-XL) 25 MG 24 hr tablet Take 12.5 mg by mouth 2 (two) times daily.      Marland Kitchen omeprazole (PRILOSEC) 20 MG capsule TAKE 1 CAPSULE BY MOUTH ONCE DAILY  5 capsule  0  . Probiotic Product (PROBIOTIC FORMULA PO) Take 1 tablet by mouth daily.       Marland Kitchen ALPRAZolam (XANAX) 0.25 MG tablet Take 0.25 mg by mouth 3 (three)  times daily as needed for anxiety.      Marland Kitchen levalbuterol (XOPENEX HFA) 45 MCG/ACT inhaler Inhale 1 puff into the lungs every 8 (eight) hours as needed for wheezing or shortness of breath.  1 Inhaler  12  . loratadine (CLARITIN) 10 MG tablet Take 10 mg by mouth daily.       No current facility-administered medications on file prior to visit.   Family History  Problem Relation Age of Onset  . Heart disease Mother   . Cancer Father 36    lung ca  . Heart disease Father 43    mi/cabg  . Colon cancer Cousin 30   History   Social History  . Marital Status: Single    Spouse Name: N/A    Number of Children: N/A  . Years of Education: N/A   Occupational History  . Not on file.   Social History Main Topics  . Smoking status: Never Smoker   . Smokeless tobacco: Never Used  . Alcohol Use: No  . Drug Use: No  . Sexual Activity: Not on file   Other Topics Concern  . Not on file   Social History Narrative  . No narrative on  file    Review of Systems  Constitutional: Negative for fever and chills.  Eyes: Negative for blurred vision and double vision.  Gastrointestinal: Negative for heartburn, nausea, vomiting and abdominal pain.  Genitourinary: Negative for dysuria, urgency and frequency.  Musculoskeletal: Negative for joint pain and myalgias.  Neurological: Positive for headaches. Negative for dizziness and tingling.  Endo/Heme/Allergies: Bruises/bleeds easily.  Psychiatric/Behavioral: Negative for depression.     Objective:   Filed Vitals:   05/16/14 0927  BP: 105/69  Pulse: 70  Temp: 97.7 F (36.5 C)  Resp: 16    Physical Exam  Constitutional: She is oriented to person, place, and time. Vital signs are normal. She appears well-developed and well-nourished.  HENT:  Head: Normocephalic.  Eyes: Conjunctivae and lids are normal. Pupils are equal, round, and reactive to light.  Neck: Normal range of motion. No thyromegaly present.  Cardiovascular: Normal rate, S1  normal, S2 normal and normal heart sounds.  An irregularly irregular rhythm present.  Pulmonary/Chest: Effort normal and breath sounds normal. Right breast exhibits no inverted nipple, no mass, no nipple discharge, no skin change and no tenderness. Left breast exhibits no inverted nipple, no mass, no nipple discharge, no skin change and no tenderness. Breasts are symmetrical.  Abdominal: Soft. Normal appearance and bowel sounds are normal. She exhibits no distension. There is no tenderness.  Genitourinary: Vagina normal and uterus normal. No breast swelling, tenderness, discharge or bleeding. Pelvic exam was performed with patient prone. There is no rash, tenderness, lesion or injury on the right labia. There is no rash, tenderness, lesion or injury on the left labia. Cervix exhibits no motion tenderness, no discharge and no friability. Right adnexum displays no mass, no tenderness and no fullness. Left adnexum displays no mass, no tenderness and no fullness.  Musculoskeletal: Normal range of motion.  Lymphadenopathy:    She has no axillary adenopathy.  Neurological: She is alert and oriented to person, place, and time.  Skin: Skin is warm, dry and intact.  Psychiatric: She has a normal mood and affect. Her speech is normal and behavior is normal. Judgment and thought content normal. Cognition and memory are normal.    Lab Results  Component Value Date   WBC 8.4 01/15/2014   HGB 12.3 01/15/2014   HCT 35.8* 01/15/2014   MCV 90.4 01/15/2014   PLT 268 01/15/2014   Lab Results  Component Value Date   CREATININE 0.67 01/15/2014   BUN 21 01/15/2014   NA 138 01/15/2014   K 4.0 01/15/2014   CL 99 01/15/2014   CO2 26 01/15/2014    Lab Results  Component Value Date   HGBA1C 5.3 01/25/2014   Lipid Panel     Component Value Date/Time   CHOL 204* 09/05/2013 1042   TRIG 70 09/05/2013 1042   HDL 73 09/05/2013 1042   CHOLHDL 2.8 09/05/2013 1042   VLDL 14 09/05/2013 1042   LDLCALC 117* 09/05/2013 1042        Assessment and plan:   [name] was seen today for gynecologic exam.  Diagnoses and associated orders for this visit:  Papanicolaou smear - Cytology - PAP [place] - Cervicovaginal ancillary only - Lipid panel - HIV antibody (with reflex) - RPR  Exposure to hepatitis C - Hepatitis panel, acute  Return in about 6 months (around 11/16/2014) for routine follow up.       Chari Manning, [facility] [phone] 05/16/2014, 9:38 AM

## 2014-05-16 NOTE — Patient Instructions (Signed)
Kegel Exercises The goal of Kegel exercises is to isolate and exercise your pelvic floor muscles. These muscles act as a hammock that supports the rectum, vagina, small intestine, and uterus. As the muscles weaken, the hammock sags and these organs are displaced from their normal positions. Kegel exercises can strengthen your pelvic floor muscles and help you to improve bladder and bowel control, improve sexual response, and help reduce many problems and some discomfort during pregnancy. Kegel exercises can be done anywhere and at any time. HOW TO PERFORM KEGEL EXERCISES 1. Locate your pelvic floor muscles. To do this, squeeze (contract) the muscles that you use when you try to stop the flow of urine. You will feel a tightness in the vaginal area (women) and a tight lift in the rectal area (men and women). 2. When you begin, contract your pelvic muscles tight for 2-5 seconds, then relax them for 2-5 seconds. This is one set. Do 4-5 sets with a short pause in between. 3. Contract your pelvic muscles for 8-10 seconds, then relax them for 8-10 seconds. Do 4-5 sets. If you cannot contract your pelvic muscles for 8-10 seconds, try 5-7 seconds and work your way up to 8-10 seconds. Your goal is 4-5 sets of 10 contractions each day. Keep your stomach, buttocks, and legs relaxed during the exercises. Perform sets of both short and long contractions. Vary your positions. Perform these contractions 3-4 times per day. Perform sets while you are:   Lying in bed in the morning.  Standing at lunch.  Sitting in the late afternoon.  Lying in bed at night. You should do 40-50 contractions per day. Do not perform more Kegel exercises per day than recommended. Overexercising can cause muscle fatigue. Continue these exercises for for at least 15-20 weeks or as directed by your caregiver. Document Released: 09/27/2012 Document Reviewed: 09/27/2012 Exit[employer] Patient Information 2015 [employer]. This information is  not intended to replace advice given to you by your health care provider. Make sure you discuss any questions you have with your health care provider.  

## 2014-05-16 NOTE — Telephone Encounter (Signed)
Returned call to patient she wanted to know if ok to take calcium with vit k.Stated she heard Calcium Plus Mena Q 7 was better to take.Also wanted to know if ok to take Hydroxyzine for anxiety instead of Alprazolam.Message sent to Dr.Hilty for advice.

## 2014-05-16 NOTE — Telephone Encounter (Signed)
Yes, that is fine  Dr H 

## 2014-05-16 NOTE — Telephone Encounter (Signed)
Returned call to patient Dr.Hilty advised ok to take calcium with vit k and ok to take hydroxyzine.

## 2014-05-16 NOTE — Telephone Encounter (Signed)
Calling because she has AFIB and is wanting to know if she can take Calcium Plus MenaQ7  [facility] its for the calcium to flow thru your body better , not sure if she is supposed to take vitamin K ..Please call .   Thanks

## 2014-05-17 LAB — HEPATITIS PANEL, ACUTE
HCV Ab: NEGATIVE
HEP B S AG: NEGATIVE
Hep A IgM: NONREACTIVE
Hep B C IgM: NONREACTIVE

## 2014-05-17 LAB — RPR

## 2014-05-17 LAB — CYTOLOGY - PAP

## 2014-05-17 LAB — HIV ANTIBODY (ROUTINE TESTING W REFLEX): HIV 1&2 Ab, 4th Generation: NONREACTIVE

## 2014-05-22 ENCOUNTER — Telehealth: Payer: Self-pay | Admitting: Emergency Medicine

## 2014-05-22 NOTE — Telephone Encounter (Signed)
Pt given lab/pap smear results. Requesting lab report. Will pick up this week.

## 2014-05-22 NOTE — Telephone Encounter (Signed)
Message copied by Ricci Barker on Wed May 22, 2014 12:48 PM ------      Message from: Chari Manning A      Created: Tue May 21, 2014  7:12 PM       Cholesterol is improved. HIV and RPR is negative. Pap is negative for infections and malignancy. Repeat in three years. ------

## 2014-06-07 ENCOUNTER — Other Ambulatory Visit: Payer: Self-pay | Admitting: Internal Medicine

## 2014-06-12 ENCOUNTER — Ambulatory Visit: Payer: No Typology Code available for payment source | Attending: Internal Medicine

## 2014-06-21 ENCOUNTER — Telehealth: Payer: Self-pay | Admitting: Internal Medicine

## 2014-06-25 ENCOUNTER — Encounter: Payer: Self-pay | Admitting: *Deleted

## 2014-06-25 NOTE — Progress Notes (Signed)
Received fax from [facility] with proof of Flu vaccine for 2015-2016. Vaccine documented in historical immunizations in EPIC. Vivia Birmingham, RN

## 2014-07-08 ENCOUNTER — Other Ambulatory Visit: Payer: Self-pay | Admitting: Internal Medicine

## 2014-10-16 ENCOUNTER — Other Ambulatory Visit: Payer: Self-pay | Admitting: Internal Medicine

## 2014-10-29 ENCOUNTER — Other Ambulatory Visit: Payer: Self-pay | Admitting: Internal Medicine

## 2014-10-30 ENCOUNTER — Telehealth: Payer: Self-pay | Admitting: Internal Medicine

## 2014-10-30 ENCOUNTER — Other Ambulatory Visit: Payer: Self-pay | Admitting: Internal Medicine

## 2014-10-30 MED ORDER — METOPROLOL SUCCINATE ER 25 MG PO TB24: 12.5000 mg | ORAL_TABLET | Freq: Two times a day (BID) | ORAL | Status: DC

## 2014-10-30 NOTE — Telephone Encounter (Signed)
E-sent  Pharmacy  Refill x 6. Patient aware and she needs an appointment in 4/ 2016. She will call back ,the last month of JAN 2016

## 2014-10-30 NOTE — Telephone Encounter (Signed)
Pt called in stating that she is about to run out of her Metoprolol and would like it refilled and [facility] [address]. Please call  Thanks

## 2014-11-13 ENCOUNTER — Other Ambulatory Visit: Payer: Self-pay | Admitting: Cardiology

## 2014-11-13 ENCOUNTER — Emergency Department (HOSPITAL_COMMUNITY): Payer: Self-pay

## 2014-11-13 ENCOUNTER — Emergency Department (HOSPITAL_COMMUNITY)
Admission: EM | Admit: 2014-11-13 | Discharge: 2014-11-13 | Disposition: A | Discharge status: Home / Self Care | Payer: Self-pay | Attending: Emergency Medicine | Admitting: Emergency Medicine

## 2014-11-13 ENCOUNTER — Encounter (HOSPITAL_COMMUNITY): Payer: Self-pay | Admitting: Physical Medicine and Rehabilitation

## 2014-11-13 ENCOUNTER — Telehealth: Payer: Self-pay | Admitting: Internal Medicine

## 2014-11-13 ENCOUNTER — Other Ambulatory Visit: Payer: Self-pay | Admitting: *Deleted

## 2014-11-13 DIAGNOSIS — G43909 Migraine, unspecified, not intractable, without status migrainosus: Secondary | ICD-10-CM | POA: Insufficient documentation

## 2014-11-13 DIAGNOSIS — Z8619 Personal history of other infectious and parasitic diseases: Secondary | ICD-10-CM | POA: Insufficient documentation

## 2014-11-13 DIAGNOSIS — Z79899 Other long term (current) drug therapy: Secondary | ICD-10-CM | POA: Insufficient documentation

## 2014-11-13 DIAGNOSIS — I4891 Unspecified atrial fibrillation: Secondary | ICD-10-CM

## 2014-11-13 DIAGNOSIS — D649 Anemia, unspecified: Secondary | ICD-10-CM | POA: Insufficient documentation

## 2014-11-13 DIAGNOSIS — I480 Paroxysmal atrial fibrillation: Secondary | ICD-10-CM

## 2014-11-13 DIAGNOSIS — R002 Palpitations: Secondary | ICD-10-CM

## 2014-11-13 DIAGNOSIS — K219 Gastro-esophageal reflux disease without esophagitis: Secondary | ICD-10-CM | POA: Insufficient documentation

## 2014-11-13 DIAGNOSIS — M549 Dorsalgia, unspecified: Secondary | ICD-10-CM | POA: Insufficient documentation

## 2014-11-13 DIAGNOSIS — Z7982 Long term (current) use of aspirin: Secondary | ICD-10-CM | POA: Insufficient documentation

## 2014-11-13 DIAGNOSIS — F419 Anxiety disorder, unspecified: Secondary | ICD-10-CM | POA: Insufficient documentation

## 2014-11-13 LAB — COMPREHENSIVE METABOLIC PANEL
ALT: 25 U/L (ref 0–35)
AST: 30 U/L (ref 0–37)
Albumin: 4 g/dL (ref 3.5–5.2)
Alkaline Phosphatase: 79 U/L (ref 39–117)
Anion gap: 9 (ref 5–15)
BUN: 14 mg/dL (ref 6–23)
CALCIUM: 9.5 mg/dL (ref 8.4–10.5)
CO2: 28 mmol/L (ref 19–32)
CREATININE: 0.57 mg/dL (ref 0.50–1.10)
Chloride: 103 mEq/L (ref 96–112)
Glucose, Bld: 83 mg/dL (ref 70–99)
Potassium: 3.6 mmol/L (ref 3.5–5.1)
SODIUM: 140 mmol/L (ref 135–145)
TOTAL PROTEIN: 8.1 g/dL (ref 6.0–8.3)
Total Bilirubin: 0.6 mg/dL (ref 0.3–1.2)

## 2014-11-13 LAB — CBC WITH DIFFERENTIAL/PLATELET
Basophils Absolute: 0 10*3/uL (ref 0.0–0.1)
Basophils Relative: 0 % (ref 0–1)
Eosinophils Absolute: 0.2 10*3/uL (ref 0.0–0.7)
Eosinophils Relative: 3 % (ref 0–5)
HEMATOCRIT: 38.8 % (ref 36.0–46.0)
HEMOGLOBIN: 13 g/dL (ref 12.0–15.0)
LYMPHS PCT: 17 % (ref 12–46)
Lymphs Abs: 1.2 10*3/uL (ref 0.7–4.0)
MCH: 29.8 pg (ref 26.0–34.0)
MCHC: 33.5 g/dL (ref 30.0–36.0)
MCV: 89 fL (ref 78.0–100.0)
Monocytes Absolute: 0.5 10*3/uL (ref 0.1–1.0)
Monocytes Relative: 7 % (ref 3–12)
NEUTROS PCT: 73 % (ref 43–77)
Neutro Abs: 5.1 10*3/uL (ref 1.7–7.7)
PLATELETS: 298 10*3/uL (ref 150–400)
RBC: 4.36 MIL/uL (ref 3.87–5.11)
RDW: 13.4 % (ref 11.5–15.5)
WBC: 7.1 10*3/uL (ref 4.0–10.5)

## 2014-11-13 LAB — I-STAT TROPONIN, ED: TROPONIN I, POC: 0 ng/mL (ref 0.00–0.08)

## 2014-11-13 MED ORDER — METOPROLOL TARTRATE 25 MG PO TABS: 25.0000 mg | ORAL_TABLET | Freq: Two times a day (BID) | ORAL | Status: DC

## 2014-11-13 NOTE — H&P (Signed)
CONSULT NOTE  Patient ID: [name] MRN: [id number], DOB/AGE: [birth year]   Admit date: 11/13/2014   Primary Physician: Lorayne Marek, MD Primary Cardiologist: Dr. [name]  Pt. Profile: 54 y/o female with known history of PAF, presenting to ER with palpitations felt to be caused by Afib.   EVALUATION OF AFIB  Problem List  Past Medical History  Diagnosis Date  . Allergy   . Anemia   . GERD (gastroesophageal reflux disease)   . Migraines   . Shingles   . Atrial fibrillation     Past Surgical History  Procedure Laterality Date  . Ovarian cyst removal  1995    dr Edwyna Ready     Allergies  Allergies  Allergen Reactions  . Sausage [Pickled Meat] Other (See Comments)    Causes migraines  . Sulfamethoxazole Other (See Comments)    flushing    HPI The patient is a 54 y/o female, followed by Dr. [name], with a past history of PAF, which has been managed with rate control therapy with metoprolol. Due to a low CHADS VASC score of 1 (female sex), she is not on oral anticoagulation. She takes 162 mg of ASA daily. In March 2015, she underwent a NST that was a normal study w/o ischemia. An echocardiogram was also performed 12/2013 demonstrating normal systolic function with an EF of 55-60%. Grade I DD was noted as well as mild AI. She has no history of thyroid disease  And no history of obstructive sleep apnea. Her other PMH includes dyslipidemia, GERD and migraine headaches.  She presented to the [facility] ED today with complaints of palpitations. She reports that this is her first episode since initial event in March 2015. She reports sudden onset this morning at rest. Her palpitations were fast and irregular. She notes mild associated chest discomfort but denies dyspnea, lightheadedness, dizziness, syncope/near-syncope. She reports that she has been compliant with daily Metoprolol, however she admits that she just noticed today instructions on her pill bottle to take twice daily. She  reports that she has only been taking Metroprolol, 12.5 mg, once daily. However, she did take an extra tablet earlier this morning when her symptoms developed. She also states that she has completely eliminated caffeine and alcohol consumption. She does note that she has been under moderate amounts of work related stress recently. She denies any recent fevers, chills, nausea, vomiting, diarrhea or dysuria. No acute weight gain or lower extremity edema.   While in the ED, telemetry has demonstrated periods of normal sinus rhythm with well defined P waves with intermittent runs of sinus tachycardia. The presence of actual atrial fibrillation is questionable. During examination, she has been noted to have fluctuating heart rates ranging from the 70s to 130s. Her runs of tachycardia of been short in duration, ~ 10 secs. She has remained hemodynamically stable with systolic blood pressures in the 120s. Labs including CBC and BMP are all WNL. Troponin is negative x 1. CXR is unremarkable. No ischemic abnormalities noted on EKGs.   Home Medications  Prior to Admission medications   Medication Sig Start Date End Date Taking? Authorizing Provider  ascorbic acid (VITAMIN C) 500 MG tablet Take 500 mg by mouth daily.    Yes Historical Provider, MD  aspirin 81 MG chewable tablet Chew 81-324 mg by mouth at bedtime.   Yes Historical Provider, MD  b complex vitamins tablet Take 1 tablet by mouth daily.   Yes Historical Provider, MD  co-enzyme Q-10 50 MG capsule  Take 50 mg by mouth daily.    Yes Historical Provider, MD  ferrous sulfate 325 (65 FE) MG EC tablet Take 325 mg by mouth daily with breakfast.   Yes Historical Provider, MD  fluticasone (FLONASE) 50 MCG/ACT nasal spray Place 2 sprays into both nostrils daily.   Yes Historical Provider, MD  loratadine (CLARITIN) 10 MG tablet Take 10 mg by mouth daily.   Yes Historical Provider, MD  Magnesium 400 MG CAPS Take 1 capsule by mouth daily.    Yes Historical  Provider, MD  metoprolol succinate (TOPROL-XL) 25 MG 24 hr tablet Take 0.5 tablets (12.5 mg total) by mouth 2 (two) times daily. 10/30/14  Yes Pixie Casino, MD  omeprazole (PRILOSEC) 20 MG capsule TAKE 1 CAPSULE BY MOUTH ONCE DAILY 02/07/14  Yes Ripudeep Krystal Eaton, MD  OVER THE COUNTER MEDICATION Take 5 mLs by mouth daily. Omega 3 (with DHA) liquid   Yes Historical Provider, MD  Probiotic Product (PROBIOTIC FORMULA PO) Take 1 tablet by mouth daily.    Yes Historical Provider, MD  ALPRAZolam (XANAX) 0.25 MG tablet Take 0.25 mg by mouth 3 (three) times daily as needed for anxiety.    Historical Provider, MD  fluticasone (FLONASE) 50 MCG/ACT nasal spray INSTILL 2 SPRAYS INT0 EACH NOSTRIL ONCE DAILY AS NEEDED 11/01/14   Lorayne Marek, MD  levalbuterol (XOPENEX HFA) 45 MCG/ACT inhaler Inhale 1 puff into the lungs every 8 (eight) hours as needed for wheezing or shortness of breath. Patient not taking: Reported on 11/13/2014 01/25/14   Reyne Dumas, MD  omega-3 acid ethyl esters (LOVAZA) 1 G capsule Take by mouth daily.    Historical Provider, MD    Family History  Family History  Problem Relation Age of Onset  . Heart disease Mother   . Cancer Father 29    lung ca  . Heart disease Father 38    mi/cabg  . Colon cancer Cousin 30    Social History  History   Social History  . Marital Status: Single    Spouse Name: N/A    Number of Children: N/A  . Years of Education: N/A   Occupational History  . Not on file.   Social History Main Topics  . Smoking status: Never Smoker   . Smokeless tobacco: Never Used  . Alcohol Use: No  . Drug Use: No  . Sexual Activity: Not on file   Other Topics Concern  . Not on file   Social History Narrative     Review of Systems General:  No chills, fever, night sweats or weight changes.  Cardiovascular:  No chest pain, dyspnea on exertion, edema, orthopnea, palpitations, paroxysmal nocturnal dyspnea. Dermatological: No rash, lesions/masses Respiratory:  No cough, dyspnea Urologic: No hematuria, dysuria Abdominal:   No nausea, vomiting, diarrhea, bright red blood per rectum, melena, or hematemesis Neurologic:  No visual changes, wkns, changes in mental status. All other systems reviewed and are otherwise negative except as noted above.  Physical Exam  Blood pressure 130/84, pulse 78, temperature 97.8 F (36.6 C), temperature source Oral, resp. rate 22, height 5\' 8"  (1.727 m), weight 180 lb (81.647 kg), SpO2 100 %.  General: Pleasant, NAD Psych: Normal affect. Neuro: Alert and oriented X 3. Moves all extremities spontaneously. HEENT: Normal  Neck: Supple without bruits or JVD. Lungs:  Resp regular and unlabored, CTA. Heart: RRR no s3, s4, soft 1/6 murmur at LUSB. Abdomen: Soft, non-tender, non-distended, BS + x 4.  Extremities: No clubbing, cyanosis or edema.  DP/PT/Radials 2+ and equal bilaterally.  Labs  Troponin [facility] Test)  Recent Labs  11/13/14 1311  TROPIPOC 0.00   No results for input(s): CKTOTAL, CKMB, TROPONINI in the last 72 hours. Lab Results  Component Value Date   WBC 7.1 11/13/2014   HGB 13.0 11/13/2014   HCT 38.8 11/13/2014   MCV 89.0 11/13/2014   PLT 298 11/13/2014     Recent Labs Lab 11/13/14 1303  NA 140  K 3.6  CL 103  CO2 28  BUN 14  CREATININE 0.57  CALCIUM 9.5  PROT 8.1  BILITOT 0.6  ALKPHOS 79  ALT 25  AST 30  GLUCOSE 83   Lab Results  Component Value Date   CHOL 191 05/16/2014   HDL 74 05/16/2014   LDLCALC 103* 05/16/2014   TRIG 68 05/16/2014   Lab Results  Component Value Date   DDIMER <0.27 01/09/2014     Radiology/Studies  Dg Chest 2 View  11/13/2014   CLINICAL DATA:  Atrial fibrillation  EXAM: CHEST  2 VIEW  COMPARISON:  01/14/2014  FINDINGS: The heart size and mediastinal contours are within normal limits. Both lungs are clear. The visualized skeletal structures are unremarkable.  IMPRESSION: No active cardiopulmonary disease.   Electronically Signed   By: Inez Catalina M.D.   On: 11/13/2014 13:28    ECG  Normal sinus rhythm. No ischemic abnormalities.   ASSESSMENT AND PLAN  Active Problems:   Palpitations   PAF (paroxysmal atrial fibrillation)    1. Palpitations: Patient has a history of atrial fibrillation with first occurrence in March 2015. This has been well-controlled with beta blocker therapy without further recurrence up until today when she developed palpitations of sudden onset. While in the ED, telemetry has demonstrated periods of normal sinus rhythm with well defined P waves with intermittent runs of sinus tachycardia. The presence of actual atrial fibrillation/flutter is questionable. Will review rhythm strips with M.D. During my examination she was in normal sinus rhythm but was noted to have intermittent runs of SVT with rates in the 120s to 130s. She has remained hemodynamically stable with systolic blood pressures in the 120s. Other than palpitations, she has been fairly asymptomatic, denying dyspnea, lightheadedness, dizziness, fatigue, syncope/near-syncope. She has avoided triggers including caffeine and alcohol consumption. It does appear however that she has been taking her Metroprolol incorrectly. She was ordered by Dr. [name] to take 12.5 mg twice daily and she has only been doing this once daily. At this point, I will recommend that she take Metroprolol twice daily and perhaps increase her dose to 25 mg twice a day. It may be beneficial for her to change from Toprol-XL to Lopressor. Also recommend outpatient cardiac monitoring to further assess for arrhythmias. However, will have MD to assess and will determine definite plan/need for admission.    Signed, Lyda Jester, PA-C 11/13/2014, 3:14 PM   Personally seen and examined. Agree with above. Parox AFIB Tele reviewed. Occasional parox atrial tachycardia as well. -Please increase metoprolol XL to 25mg  BID -Hydrate well -Will have her follow up as outpatient and will  set up monitor -If tachypalpitations occur again, OK to take extra metoprolol.  -She is interested in ablation if uncontrolled with meds. I reviewed that we may try antiarrhythmic medication first. -Exam: Irreg at times, CTAB, soft abd, no edema.  -OK with DC from ER.   Candee Furbish, MD

## 2014-11-13 NOTE — Telephone Encounter (Signed)
Patient called back before I could speak with DOD.   She states her HR is going up into the 120s. Was 108 when I spoke with her. I advised her that if her HR is fluctuating and going above 100 (RVR) she should go to [facility] ED for evaluation. Her last AF episode, she converted to NSR with IV dilt per note. She is also on low dose ASA for anticoagulation - concern for stroke risk if in AF. She voiced understanding of the recommendation to go to ED. She will have a responsible adult drive her to the ED

## 2014-11-13 NOTE — Telephone Encounter (Signed)
Spoke with patient. She states she is having an AF attack. Her last was March 2015. She states it feels like her heart is "flip-flopping" and makes her feel like she has to cough. She states she is feeling anxious - she has taken a xanax for this. She denies chest pain/SOB.   She states she work up this morning feeling like her heart was out of rhythm. 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 She took her metoprolol succinate 12.5mg  last nite as directed and took an extra dose this AM. She takes aspirin 81mg  (had previously been on 325mg )  She has a pulse ox - HR 78 She has no issues with BP, although she reports today her systolic is a little higher than usual

## 2014-11-13 NOTE — ED Provider Notes (Signed)
Patient is a 54 year old female with paroxysmal atrial fibrillation, on daily aspirin who presents with intermittent palpitations which have since resolved since arrival. On exam the patient has clear heart and lung sounds, occasional ectopy with PACs on the monitor, the patient feels remarkably better and has no signs of peripheral edema, congestive heart failure or any other end organ damage. Labs pending, if reassuring and cardiology agrees the patient can be discharged home after an inrease in home BB dosage - Cardiology has seen the pt and has given the plan to the pt who is in agreement.   EKG Interpretation  Date/Time:  Wednesday November 13 2014 12:34:56 EST Ventricular Rate:  80 PR Interval:  154 QRS Duration: 84 QT Interval:  370 QTC Calculation: 426 R Axis:   64 Text Interpretation:  Normal sinus rhythm Normal ECG since last tracing no significant change Confirmed by Calib Wadhwa  MD, Caylon Saine ([id number]) on 11/13/2014 1:43:22 PM      Medical screening examination/treatment/procedure(s) were conducted as a shared visit with non-physician practitioner(s) and myself.  I personally evaluated the patient during the encounter.  Clinical Impression:   Final diagnoses:  Palpitations  Paroxysmal atrial fibrillation         Johnna Acosta, MD 11/13/14 2339
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
## 2014-11-13 NOTE — ED Notes (Signed)
Pt presents to department for evaluation of atrial fibrillation. States she felt heart "racing" this morning. Also reports chest discomfort and anxiety. States HR of 120bpm this morning. Pt is alert and oriented x4. NSR noted on EKG in triage.

## 2014-11-13 NOTE — ED Notes (Signed)
Cardiologist at bedside, [place] at bedside

## 2014-11-14 ENCOUNTER — Ambulatory Visit: Payer: Self-pay | Admitting: *Deleted

## 2014-11-14 VITALS — BP 122/72 | HR 82

## 2014-11-14 DIAGNOSIS — Z719 Counseling, unspecified: Secondary | ICD-10-CM

## 2014-11-14 NOTE — Progress Notes (Signed)
Pt was seen for nurse visit and monitor placement. Lacking insurance, we contacted 3rd party vendor to determine self-pay cost. Due to self-pay concerns on part of patient, Chelley Truitt, Chief Technology Officer and I conducted communication w/ [address] office, Patent attorney, and Armed forces operational officer (monitor service used at [employer]) to discuss options. Pt was counseled on payment plan offered by [employer] ($67/month for repayment of $850 full charge). Pt also informed that [place] both have hardship programs which would provide free monitoring for pt. Due to concerns over taking financial responsibility for charges if started on a monitor today, she ultimately elected not to have monitor placed today and instead will fill out paperwork for [employer] due to shorter expected turnaround time on hardship approval.   Pt aware to report any symptoms w/ change in meds or recurrent A Fib.

## 2014-11-15 ENCOUNTER — Ambulatory Visit: Payer: Self-pay

## 2014-11-18 ENCOUNTER — Ambulatory Visit: Payer: Self-pay

## 2014-11-19 ENCOUNTER — Encounter: Payer: Self-pay | Admitting: *Deleted

## 2014-11-19 NOTE — Progress Notes (Signed)
Patient ID: [name], female   DOB: [birth year], 54 y.o.   MRN: [id number] Patient enrolled with [employer] for a cardiac event monitor.   Pending hardship application approval, the monitor will be scheduled to be applied in our office.  Patient does not have a landline phone and [employer] will not mail an ACT monitor as a CEM to the home.

## 2014-11-22 ENCOUNTER — Telehealth: Payer: Self-pay | Admitting: Physician Assistant

## 2014-11-22 NOTE — Telephone Encounter (Signed)
Pt called in wanting to speak with Gaspar Bidding about writing her a note to continue going to a masseuse, Sonia Baller May, that he recommended would help with her stress when she goes in to Afib. Please call  Thanks

## 2014-11-22 NOTE — Telephone Encounter (Signed)
Left message on machine to call us back.

## 2014-11-22 NOTE — Telephone Encounter (Signed)
Ok to provide her with a note that therapeutic massage is fine.  Dr. Lemmie Evens

## 2014-11-22 NOTE — Telephone Encounter (Addendum)
[name] wrote order last year for massage therapist. She states she needs another order to see the masseuse. This r/t return of afib, believes may help her problem.  I cannot find historic note or order anywhere on this. Need clarification on what to put in.

## 2014-11-25 ENCOUNTER — Encounter: Payer: Self-pay | Admitting: *Deleted

## 2014-11-25 NOTE — Telephone Encounter (Signed)
Patient is awaiting approval for application of Holter Monitor [facility])   Notified patient that note was composed and sent thru [place] and will be mailed to her. She voiced understanding.

## 2014-11-25 NOTE — Telephone Encounter (Signed)
Can this encounter be closed?

## 2014-12-03 ENCOUNTER — Encounter (INDEPENDENT_AMBULATORY_CARE_PROVIDER_SITE_OTHER): Payer: Self-pay

## 2014-12-03 DIAGNOSIS — I480 Paroxysmal atrial fibrillation: Secondary | ICD-10-CM

## 2014-12-13 ENCOUNTER — Ambulatory Visit: Payer: Self-pay

## 2014-12-19 ENCOUNTER — Encounter: Payer: Self-pay | Admitting: *Deleted

## 2014-12-19 NOTE — Progress Notes (Signed)
Patient ID: [name], female   DOB: [birth year], 54 y.o.   MRN: [id number] Patient was mailed a [place] by [employer].  Patient sent a baseline recording on 12/03/14.  Estimated EOS will be 01/01/15.

## 2015-01-03 ENCOUNTER — Other Ambulatory Visit: Payer: Self-pay | Admitting: *Deleted

## 2015-01-03 ENCOUNTER — Telehealth: Payer: Self-pay | Admitting: Internal Medicine

## 2015-01-03 MED ORDER — METOPROLOL SUCCINATE ER 25 MG PO TB24: 25.0000 mg | ORAL_TABLET | Freq: Two times a day (BID) | ORAL | Status: DC

## 2015-01-03 MED ORDER — METOPROLOL SUCCINATE ER 25 MG PO TB24: 12.5000 mg | ORAL_TABLET | Freq: Every morning | ORAL | Status: DC

## 2015-01-03 NOTE — Telephone Encounter (Signed)
Med refilled.

## 2015-01-03 NOTE — Telephone Encounter (Signed)
°  1. Which medications need to be refilled? Metoprolol    2. Which pharmacy is medication to be sent to? [facility]   3. Do they need a 30 day or 90 day supply? 90  4. Would they like a call back once the medication has been sent to the pharmacy? Yes .  The pharmacy needs to know if she is supposed Tartrate or the Succinate . The Pharmacy has the extended release , but not the other one .

## 2015-01-06 ENCOUNTER — Other Ambulatory Visit: Payer: Self-pay

## 2015-01-07 ENCOUNTER — Other Ambulatory Visit: Payer: Self-pay | Admitting: *Deleted

## 2015-01-07 MED ORDER — METOPROLOL SUCCINATE ER 25 MG PO TB24: 12.5000 mg | ORAL_TABLET | Freq: Every morning | ORAL | Status: DC

## 2015-01-10 ENCOUNTER — Telehealth: Payer: Self-pay | Admitting: *Deleted

## 2015-01-10 NOTE — Telephone Encounter (Signed)
Received inter-office mail copy of patient's monitor Monitor reviewed by Dr. [name] >> NSR, no arrhythmia  Called patient and LM with the monitor results on name-verified VM and for patient to call back - needs yearly OV with Dr. [name]

## 2015-02-12 ENCOUNTER — Telehealth: Payer: Self-pay | Admitting: Internal Medicine

## 2015-02-12 ENCOUNTER — Ambulatory Visit (HOSPITAL_COMMUNITY)
Admission: RE | Admit: 2015-02-12 | Discharge: 2015-02-12 | Disposition: A | Discharge status: Home / Self Care | Payer: Self-pay | Source: Ambulatory Visit | Attending: Nurse Practitioner | Admitting: Nurse Practitioner

## 2015-02-12 ENCOUNTER — Encounter (HOSPITAL_COMMUNITY): Payer: Self-pay | Admitting: Nurse Practitioner

## 2015-02-12 VITALS — BP 122/84 | HR 70 | Ht 68.5 in | Wt 184.6 lb

## 2015-02-12 DIAGNOSIS — E669 Obesity, unspecified: Secondary | ICD-10-CM | POA: Insufficient documentation

## 2015-02-12 DIAGNOSIS — I480 Paroxysmal atrial fibrillation: Secondary | ICD-10-CM | POA: Insufficient documentation

## 2015-02-12 NOTE — Telephone Encounter (Signed)
Pt calling again,says she need to seen by somebody today per her doctor.
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
## 2015-02-12 NOTE — Telephone Encounter (Signed)
Patient will be seen today @ 3:30pm

## 2015-02-12 NOTE — Telephone Encounter (Signed)
Returned call to patient. She reports last night she was in AF and her HR was elevated. She states this AM when she woke up her heartbeat was still irregular but she currently feels like it is in rhythm. She wishes to be seen today after talking with MD on call last nite. Advised patient we can see if she can be added on to AF clinic schedule with Amber Greenland, NP  Will send staff message to her nurse to contact patient.

## 2015-02-12 NOTE — Telephone Encounter (Signed)
Pt called in stating that last night she was in Afib , she followed up with her PCP in which he advised her to be seen today by a cardiologist. She states that she is no longer in Afib but she is having an [facility] [place](migraines). Please f/u with pt  Thanks

## 2015-02-12 NOTE — Telephone Encounter (Signed)
Received page regarding Mrs Appleton HR being fast.  She is concerned about being in AF reporting palpitations and irregular pulse on monitor.  Says HR was fluctuating between 100-130 bpm, currently sustaining around 90s after taking 12.5 mg of metoprolol.  Overall, feels okay.  We discussed option of waiting till AM to be seen in clinic vs coming to ED at any point.  She prefers to wait and call clinic in AM and states she will use ED if needed.  I am unsure if this represents AF as I would expect her rate to be faster.  If it were confirmed to be AF, she may benefit from "pill in pocket" for prn chemical cardioversion.

## 2015-02-17 ENCOUNTER — Encounter: Payer: Self-pay | Admitting: Internal Medicine

## 2015-02-17 ENCOUNTER — Ambulatory Visit (INDEPENDENT_AMBULATORY_CARE_PROVIDER_SITE_OTHER): Payer: Self-pay | Admitting: Internal Medicine

## 2015-02-17 VITALS — BP 102/70 | HR 72 | Ht 68.5 in | Wt 184.7 lb

## 2015-02-17 DIAGNOSIS — I480 Paroxysmal atrial fibrillation: Secondary | ICD-10-CM

## 2015-02-17 DIAGNOSIS — F411 Generalized anxiety disorder: Secondary | ICD-10-CM

## 2015-02-17 DIAGNOSIS — R002 Palpitations: Secondary | ICD-10-CM

## 2015-02-17 NOTE — Progress Notes (Signed)
OFFICE NOTE  Chief Complaint:  Recent palpitations  Primary Care Physician: Lorayne Marek, MD  HPI:  [name] is a 54 y.o. female with a past medical history significant for migraines, allergic rhinitis, and GERD, but a strong family history of CAD and aneurysm. 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 Since the palpitations became persistent patient came to the ER. In the ER patient was found to be in A. fib with RVR and Cardizem 20 mg IV bolus was given after which patient's heart rate decreased but still in A. fib. Patient also after coming to the ER started developing some chest pressure for which patient was placed on nitroglycerin patch following which patient symptoms resolved. Shortly after she converted to sinus rhythm. She did have an extensive cardiac work-up by Dr. Pauline Aus (who is a retired cardiologist) about 15 years ago. During her hospitalization she spontaneously converted to sinus rhythm. She is placed on low-dose beta blocker and aspirin due to her low CHADSVASC score of 0. Outpatient stress testing was recommended due to her chest pain.  Unfortunately she was seen twice with recurrent chest pain symptoms. Some of which may be due to anxiety as it was relieved with a Xanax. 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 She also reports pain that starts from the neck and goes down the left arm.  She is not aware of recurrent atrial fibrillation.  Some [place] back in the office today. She was recently seen by Roderic Palau, nurse practitioner in the A. fib clinic. She had breakthrough palpitations however to Her metoprolol and they subsided. She was not placed on antiarrhythmics and recommended to follow-up with me. Since that episode she's had no recurrent A. fib. She says that she's had 3  breakthrough episodes. She is maintained on aspirin 81 mg daily as her CHADSVASC score is now 1.   PMHx:  Past Medical History  Diagnosis Date  . Allergy   . Anemia   . GERD (gastroesophageal reflux disease)   . Migraines   . Shingles   . Atrial fibrillation     Past Surgical History  Procedure Laterality Date  . Ovarian cyst removal  1995    dr Edwyna Ready    FAMHx:  Family History  Problem Relation Age of Onset  . Heart disease Mother   . Cancer Father 47    lung ca  . Heart disease Father 62    mi/cabg  . Colon cancer Cousin 30    SOCHx:   reports that she has never smoked. She has never used smokeless tobacco. She reports that she does not drink alcohol or use illicit drugs.  ALLERGIES:  Allergies  Allergen Reactions  . Sausage [Pickled Meat] Other (See Comments)    Causes migraines  . Sulfamethoxazole Other (See Comments)    flushing    ROS: A comprehensive review of systems was negative except for: Cardiovascular: positive for palpitations  HOME MEDS: Current Outpatient Prescriptions  Medication Sig Dispense Refill  . ascorbic acid (VITAMIN C) 500 MG tablet Take 500 mg by mouth daily.     Marland Kitchen aspirin 81 MG chewable tablet Chew 162 mg by mouth at bedtime.     Marland Kitchen b complex vitamins tablet Take 1 tablet by mouth daily.    Marland Kitchen co-enzyme Q-10 50  MG capsule Take 50 mg by mouth daily.     . ferrous sulfate 325 (65 FE) MG EC tablet Take 325 mg by mouth daily with breakfast.    . ibuprofen (ADVIL,MOTRIN) 200 MG tablet Take 800 mg by mouth daily as needed for mild pain or moderate pain.    . Magnesium 400 MG CAPS Take 1 capsule by mouth daily.     . metoprolol succinate (TOPROL-XL) 25 MG 24 hr tablet Take 12.5 mg by mouth 2 (two) times daily.    Marland Kitchen omeprazole (PRILOSEC) 20 MG capsule TAKE 1 CAPSULE BY MOUTH ONCE DAILY 5 capsule 0  . OVER THE COUNTER MEDICATION Take 5 mLs by mouth daily. Omega 3 (with DHA) liquid    . Probiotic Product (PROBIOTIC FORMULA PO) Take 1 tablet by  mouth daily.      No current facility-administered medications for this visit.    LABS/IMAGING: No results found for this or any previous visit (from the past 48 hour(s)). No results found.  VITALS: BP 102/70 mmHg  Pulse 72  Ht 5' 8.5" (1.74 m)  Wt 184 lb 11.2 oz (83.779 kg)  BMI 27.67 kg/m2  EXAM: General appearance: alert and no distress Neck: no adenopathy, no carotid bruit, no JVD, supple, symmetrical, trachea midline and thyroid not enlarged, symmetric, no tenderness/mass/nodules Lungs: clear to auscultation bilaterally Heart: regular rate and rhythm, S1, S2 normal, no murmur, click, rub or gallop Abdomen: soft, non-tender; bowel sounds normal; no masses,  no organomegaly Extremities: extremities normal, atraumatic, no cyanosis or edema Pulses: 2+ and symmetric Skin: Skin color, texture, turgor normal. No rashes or lesions  EKG: Deferred  ASSESSMENT: 1. Paroxysmal atrial fibrillation 2. Anxiety 3. Atypical chest pain - negative NST 4. Mild AI  PLAN: 1.   [name] has paroxysmal atrial fibrillation without a clear cause. She has undergone stress testing as well as an echocardiogram. That showed preserved ejection fraction with mild aortic insufficiency, otherwise no other significant abnormalities. Her stress test was nonischemic. There is no concern for sleep apnea. Her father also had atrial fibrillation with a similar age of onset. She has subsequently stopped using alcohol and caffeine and is working on decreasing stress and improving sleep in her life. All of this may help however she is at high risk for recurrence. For now given her low CHADSVASC score, I would continue to recommend switching to low-dose aspirin 81 mg daily. She reports her left arm pain has resolved. At this time she like to continue with the beta blocker for prevention of her A. fib but if she has further breakthrough she may need to go on antiarrhythmic therapy. She is asked about whether she  can exercise and I'm in favor of this.  Plan to see her back in 6 months.  Pixie Casino, MD, [facility] Attending Cardiologist [facility]  Mersades Barbaro C 02/17/2015, 4:53 PM

## 2015-02-17 NOTE — Patient Instructions (Signed)
Your physician wants you to follow-up in: 6 months with Dr. Hilty. You will receive a reminder letter in the mail two months in advance. If you don't receive a letter, please call our office to schedule the follow-up appointment.    

## 2015-02-21 ENCOUNTER — Telehealth: Payer: Self-pay | Admitting: Internal Medicine

## 2015-02-21 NOTE — Telephone Encounter (Signed)
Received page regarding [name] HR being fast. She states she had to take a friend to the ED and just got home.  She is concerned about being in AF reporting palpitations and irregular pulse on monitor. Says HR was fluctuating between 80-130 bpm, currently sustaining around <110 bpm after taking 25 mg of metoprolol within the last 10 minutes. Overall, feels okay. I advised that she stay home as long as she is feeling okay and allow time for medication to work.  I stated that she can call clinic in the morning if it persists or to come to ED if it got worse or she did not feel well.

## 2015-03-03 ENCOUNTER — Other Ambulatory Visit: Payer: Self-pay | Admitting: Family Medicine

## 2015-03-03 ENCOUNTER — Ambulatory Visit: Payer: Self-pay | Attending: Internal Medicine | Admitting: Family Medicine

## 2015-03-03 ENCOUNTER — Ambulatory Visit (HOSPITAL_COMMUNITY)
Admission: RE | Admit: 2015-03-03 | Discharge: 2015-03-03 | Disposition: A | Discharge status: Home / Self Care | Payer: Self-pay | Source: Ambulatory Visit | Attending: Family Medicine | Admitting: Family Medicine

## 2015-03-03 VITALS — BP 121/77 | HR 72 | Temp 97.9°F | Wt 186.0 lb

## 2015-03-03 DIAGNOSIS — M25531 Pain in right wrist: Secondary | ICD-10-CM

## 2015-03-03 DIAGNOSIS — M25571 Pain in right ankle and joints of right foot: Secondary | ICD-10-CM

## 2015-03-03 DIAGNOSIS — M7731 Calcaneal spur, right foot: Secondary | ICD-10-CM | POA: Insufficient documentation

## 2015-03-03 NOTE — Progress Notes (Signed)
Subjective:     Patient ID: [name], female   DOB: [birth year], 54 y.o.   MRN: [id number]  HPI   Patient presents after falling 5 days ago. She stepped down really hard on a step, lost her balance and partially fell, catching herself on her right wrist. Since then she has had pain in her right heel and pain and swelling in her right wrist. She has been "babying" both but has not worn and type of support or taken any medication. She has a history of atrial fib and her cardiologist prefers she stay away from NSAIDs. She has applied no ice or heat.   Review of Systems   She denies headaches, dizziness, cough, shortness of breath, chest pain or palpitations. She denies excessive fatigue, wt loss or loss of appetite     Objective:   Physical Exam    Gen:  Alert, oriented, appropriate, in no distress except when she walks. Skin:  Warm and dry Lungs:  Clear to auscultation Heart:  Heart sounds regular w/o m,g,r. Ext: There is tenderness on the right lateral wrist and down thumb with a minute amount of swelling.  There is tenderness to pressure on the right heel.     Assessment:     Wrist sprain Heel pain    Plan:     X-ray of both wrist and heel. Thumb spica splint to right wrist and thumb. Will folow-up tomorrow with x-ray results.      Micheline Chapman, FNP [facility]

## 2015-03-03 NOTE — Patient Instructions (Signed)
Since it has been five days, warm compresses may help Wear wrist splint as long as you feel it is helping. Will call you with results of x-ray tomorrow. Follow-up as needed See Dr. Eino Farber at earliest date available.

## 2015-03-04 ENCOUNTER — Telehealth: Payer: Self-pay | Admitting: Internal Medicine

## 2015-03-04 NOTE — Telephone Encounter (Signed)
Patient called requesting xray results, please f/u with pt

## 2015-03-05 ENCOUNTER — Telehealth: Payer: Self-pay | Admitting: *Deleted

## 2015-03-05 ENCOUNTER — Telehealth: Payer: Self-pay

## 2015-03-05 DIAGNOSIS — M25539 Pain in unspecified wrist: Secondary | ICD-10-CM

## 2015-03-05 NOTE — Telephone Encounter (Signed)
Patient asking for results of her wrist and foot xray-please call patient.

## 2015-03-05 NOTE — Telephone Encounter (Signed)
Returned patient phone call and she is aware of her x ray results Patient still having a lot of pain to her wrist and would like to see ortho Referral placed in epic

## 2015-03-05 NOTE — Telephone Encounter (Signed)
Pt calling for x-ray results, please f/u with pt.

## 2015-03-13 ENCOUNTER — Ambulatory Visit: Payer: Self-pay | Admitting: Sports Medicine

## 2015-03-28 ENCOUNTER — Ambulatory Visit: Payer: Self-pay | Attending: Internal Medicine

## 2015-04-10 ENCOUNTER — Encounter: Payer: Self-pay | Admitting: Sports Medicine

## 2015-04-10 ENCOUNTER — Ambulatory Visit (INDEPENDENT_AMBULATORY_CARE_PROVIDER_SITE_OTHER): Payer: No Typology Code available for payment source | Admitting: Sports Medicine

## 2015-04-10 ENCOUNTER — Telehealth: Payer: Self-pay | Admitting: Internal Medicine

## 2015-04-10 ENCOUNTER — Ambulatory Visit
Admission: RE | Admit: 2015-04-10 | Discharge: 2015-04-10 | Disposition: A | Discharge status: Home / Self Care | Payer: No Typology Code available for payment source | Source: Ambulatory Visit | Attending: Sports Medicine | Admitting: Sports Medicine

## 2015-04-10 VITALS — BP 114/76 | HR 66 | Ht 68.0 in | Wt 181.0 lb

## 2015-04-10 DIAGNOSIS — M654 Radial styloid tenosynovitis [de Quervain]: Secondary | ICD-10-CM

## 2015-04-10 DIAGNOSIS — M25531 Pain in right wrist: Secondary | ICD-10-CM

## 2015-04-10 NOTE — Telephone Encounter (Signed)
No problem - probably don't have to hold aspirin for that.  Dr. Lemmie Evens

## 2015-04-10 NOTE — Progress Notes (Signed)
[name] - 54 y.o. female MRN [id number]  Date of birth: [birth year]  SUBJECTIVE:  Including CC & ROS.  Patient is a 54 year old female sent pain with right wrist pain that has been present for approximately 8 weeks. Patient reports that her wrist pain started gradually but that worsened acutely when she was helping lift a family member. The day she was lifting her family member she felt a pop in her wrist. She reports developing some mild swelling over the distal wrist on the radial aspect but no bruising or erythema. She was seen by her PCPs office who performed 4 view wrist x-rays the revealed no signs of acute fracture and mild degenerative changes. Patient was placed in a thumb spica brace which she is found to be somewhat helpful in improving her symptoms. Patient is unable to take any NSAIDS due to her history of heart disease with A-fib and was told by her cardiologist to avoid.   Patient also reported another unrelated problem of right heel pain, however, but given our limitations in time since patient was 30 minutes late we'll plan to follow-up in addressing her right heel pain at a subsequent visit.   ROS: Review of systems otherwise negative except for information present in HPI  HISTORY: Past Medical, Surgical, Social, and Family History Reviewed & Updated per EMR. Pertinent Historical Findings include: Atrial fibrillation no anticoagulation therapy only aspirin, rate control with metoprolol succinate  DATA REVIEWED: Personally reviewed patient's 4 view wrist x-rays revealing no signs of acute fracture or dislocation consistent with mild degenerative changes.  PHYSICAL EXAM:  VS: BP:114/76 mmHg  HR:66bpm  TEMP: ( )  RESP:   HT:5\' 8"  (172.7 cm)   WT:181 lb (82.101 kg)  BMI:27.6 RIGHT WRIST EXAM: General: well nourished Skin of UE: warm; dry, no rashes, lesions, ecchymosis or erythema. Vascular: radial pulses 2+ bilaterally Neurologically: Sensation to light touch  upper extremities equal and intact bilaterally.  Observation: No evidence of edema or ecchymosis, mild swelling over the 1st and 2nd dorsal compartment Palpation: Maximal TTP over the 1st dorsal extensor compartment at the distal wrist just distal of the radial styloid Mild tenderness over the anatomic snuffbox No tenderness to palpation over distal radius or distal ulna styloids Range of motion: Full range of motion of the wrist and wrist flexion, extension, mild pain with supination/ pronation.   Muscle strength: pain and weakness with wrist extension worse with extension and ulnar deviation. No pain or weakness with wrist flexion  Neurologic: Normal sensation C5-T1, Negative tinel's at the radial, antecubital, and carpal tunnels.  Special tests: Severe pain with finkelstein test concerning positive 1st dorsal extension compartment tendonitis Negative squeeze test at the forearm, normal elbow valgus and varus stress  MSK [place]: 1st and 2nd dorsal extension compartment have localized distal edema in the tendonous sheath and small microtrauma present in the tendon.   ASSESSMENT & PLAN: See problem based charting & AVS for pt instructions. Impression: 1. 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 Possible occult right scaphoid fracture  3. History of Atrial Fibrillation   Recommendations: - Ordered repeat wrist xray to r/o scaphoid fracture, low likelihood of fracture with no history of fall on the hand/ wrist.  - discuss treatment options for tendinopathy including bracing and topical anti-inflammatory, PT, and relative rest. - We also discuss more aggressive treatment options of US guided tendonous sheath injection with bracing for 10-14 days.  - Patient wanted to discuss options of injection with cardiologist before  proceeding - Recommended continued bracing and provided OTC topical options of Arnica Gel, Capsaicin, Aspercreme. If the above over the counter options  don't help, call us back and we can prescribe Voltaren Gel.  - Will call patient with Xray results over the phone

## 2015-04-10 NOTE — Patient Instructions (Signed)
Arnica Gel Capsaicin Aspercreme If the above over the counter options don't help, call us back and we can prescribe Voltaren Gel

## 2015-04-10 NOTE — Telephone Encounter (Signed)
Pt called in stating that she went to the Orthopedic doctor to be evaluated for a fractured wrist and the doctor wanted to give her a shot with a steroid and ladicane in it. She wanted to make sure that she was cleared to receive the shot. Please call  Thanks

## 2015-04-10 NOTE — Telephone Encounter (Signed)
Returned call to patient she wanted to ask Dr.Hilty if ok to have a steroid injection for wrist pain.Message sent to Dr.Hilty for advice.

## 2015-04-10 NOTE — Telephone Encounter (Signed)
Returned call to patient Dr.Hilty advised ok to have steroid injection.

## 2015-04-11 NOTE — Addendum Note (Signed)
Addended by: Cyd Silence on: 04/11/2015 10:52 AM   Modules accepted: Orders

## 2015-04-21 ENCOUNTER — Encounter: Payer: Self-pay | Admitting: Physical Therapy

## 2015-04-21 ENCOUNTER — Ambulatory Visit: Payer: No Typology Code available for payment source | Attending: Sports Medicine | Admitting: Physical Therapy

## 2015-04-21 DIAGNOSIS — M25531 Pain in right wrist: Secondary | ICD-10-CM | POA: Insufficient documentation

## 2015-04-21 DIAGNOSIS — R29898 Other symptoms and signs involving the musculoskeletal system: Secondary | ICD-10-CM | POA: Insufficient documentation

## 2015-04-21 NOTE — Therapy (Signed)
[facility] [address], [place], [place], [address] Phone: [phone]   Fax:  [phone]  Physical Therapy Evaluation  Patient Details  Name: [name] MRN: [id number] Date of Birth: 17-Oct-1961 Referring Provider:  Thurman Coyer, DO  Encounter Date: 04/21/2015      PT End of Session - 04/21/15 1309    Visit Number 1   Date for PT Re-Evaluation 06/16/15   PT Start Time 1230   PT Stop Time 1305   PT Time Calculation (min) 35 min   Activity Tolerance Patient tolerated treatment well   Behavior During Therapy [facility] for tasks assessed/performed      Past Medical History  Diagnosis Date  . Allergy   . Anemia   . GERD (gastroesophageal reflux disease)   . Migraines   . Shingles   . Atrial fibrillation     Past Surgical History  Procedure Laterality Date  . Ovarian cyst removal  1995    dr Edwyna Ready    There were no vitals filed for this visit.  Visit Diagnosis:  Weakness of wrist - Plan: PT plan of care cert/re-cert  Right wrist pain - Plan: PT plan of care cert/re-cert      Subjective Assessment - 04/21/15 1237    Subjective Patient reports she injured her right wrist and heard an audible pop and felt a pop.  Afterwards she had increased pain. Injury happened 2 months ago. Patient is right handed. Difficulty with pulling items off the ground with right hand.    Patient Stated Goals Patient wants to use right wrist.    Currently in Pain? Yes   Pain Score 8    Pain Location Wrist   Pain Orientation Right   Pain Descriptors / Indicators Shooting   Pain Type Acute pain   Pain Onset 1 to 4 weeks ago   Pain Frequency Intermittent   Aggravating Factors  open jar, brush teeth, use right hand, do activities without brace, pick up her dog.    Pain Relieving Factors wear brace and not use it   Multiple Pain Sites No            [facility] PT Assessment - 04/21/15 0001    Assessment   Medical  Diagnosis M65.4 DeQuervains tenosynovitis, right, M25.531 right wrist pain   Onset Date/Surgical Date 03/10/15   Hand Dominance Right   Prior Therapy None   Balance Screen   Has the patient fallen in the past 6 months No   Has the patient had a decrease in activity level because of a fear of falling?  No   Is the patient reluctant to leave their home because of a fear of falling?  No   Prior Function   Level of Independence Independent with basic ADLs   Vocation Part time employment   Vocation Requirements lifting, transport patient to wheelchair   Observation/Other Assessments   Focus on Therapeutic Outcomes (FOTO)  51% limitation  goal is 34% limitaiton   AROM   AROM Assessment Site Wrist   Right/Left Wrist Right   Right Wrist Extension 57 Degrees   Right Wrist Flexion 58 Degrees   PROM   PROM Assessment Site Wrist   Right/Left Wrist Right   Right Wrist Extension 60 Degrees   Right Wrist Flexion 74 Degrees   Strength   Overall Strength Comments right elbow extension 3/5   Strength Assessment Site Wrist   Right/Left Wrist Right   Right Wrist Flexion 4-/5  pain   Right Wrist Extension 4/5   Right/Left hand Right   Right Hand Grip (lbs) 42#  57#   Palpation   Palpation comment pinpoint tenderness located in right lateral radius distally                   [facility] Adult PT Treatment/Exercise - 04/21/15 0001    Manual Therapy   Manual Therapy Taping   Kinesiotex Facilitate Muscle   Kinesiotix   Facilitate Muscle  for right wrist dequeverians                PT Education - 04/21/15 1309    Education provided No          PT Short Term Goals - 04/21/15 1320    PT SHORT TERM GOAL #1   Title pain with activity decreased >/= 25%   Time 4   Period Weeks   Status New   PT SHORT TERM GOAL #2   Title able to take spling of for 3 hours due to reduced pain   Time 4   Period Weeks   Status New   PT SHORT TERM GOAL #3   Title eat and prepare food with  pain decreased >/= 25%.   Time 4   Period Weeks   Status New   PT SHORT TERM GOAL #4   Title perform work tasks with >/= 25% decreased pain   Time 4   Period Weeks   Status New           PT Long Term Goals - 04/21/15 1321    PT LONG TERM GOAL #1   Title able to work with pain decreased >/= 75%   Time 8   Period Weeks   Status New   PT LONG TERM GOAL #2   Title grip strength >/= 50 pounds so she can perform hand activities without difficulty   Time 8   Period Weeks   Status New   PT LONG TERM GOAL #3   Title right wrist strength 5/5 so she can perform her daily tasks with greater ease   Time 8   Period Weeks   Status New   PT LONG TERM GOAL #4   Title able to take her splint off for all activities with the exception of heavy lifting.    Time 8   Period Weeks   Status New               Plan - 04/21/15 1310    Clinical Impression Statement Patient is a 54 year old female with diagnosis of right DeQuervain's tenosynovitis and right wrist pain.  Patient is wearing a wrist splint. 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  Patient heard and felt a pop in the right wrist and continued to have pain. Patient had an ultraound on the right wrist that showed  first and second dorsal extensor compartment tendinopathy.  FOTO score is 51% limitation.  Right wrist strength is 4-/5 for flexion and 4/5 for extension.  Right elbow extension 4/5. Rigth grip strength is 42 pounds and left is 57 pounds.  Patient is right handed. Patient has tenderness located on the distal lateral radius . Patient reports intermittent pain at level 8/10 on the right wrist and movement makes it worse.  Patient is unable to use right wrist for any activity due to pain. Patient would benefit from physical therapy to decrease pain and improve strength so  she can return to prior functional level.    Pt will benefit from skilled therapeutic intervention in order to improve on  the following deficits Decreased range of motion;Pain;Decreased endurance;Decreased activity tolerance;Impaired flexibility;Decreased strength;Decreased mobility   Rehab Potential Excellent   Clinical Impairments Affecting Rehab Potential None   PT Frequency 2x / week   PT Duration 8 weeks   PT Treatment/Interventions Cryotherapy;Electrical Stimulation;Ultrasound;Moist Heat;Therapeutic activities;Therapeutic exercise;Neuromuscular re-education;Manual techniques;Patient/family education;Passive range of motion;Taping   PT Next Visit Plan ultrasound, soft tissue work, see how kinesiotape helped, ROM exercises   PT Home Exercise Plan ROM exercises   Recommended Other Services None   Consulted and Agree with Plan of Care Patient         Problem List Patient Active Problem List   Diagnosis Date Noted  . De Quervain's tenosynovitis, right 04/10/2015  . Palpitations 11/13/2014  . PAF (paroxysmal atrial fibrillation) 11/13/2014  . Pain in joint, shoulder region 02/22/2014  . Aortic insufficiency 01/29/2014  . Atrial fibrillation 01/09/2014  . Atrial fibrillation with RVR 01/09/2014  . Chest pain 01/09/2014  . A-fib 01/09/2014  . OTHER AND UNSPECIFIED OVARIAN CYST 09/08/2010  . GERD 08/10/2010  . Anxiety state 02/02/2010    GRAY,CHERYL,PT 04/21/2015, 1:28 PM  [facility] [address], [place], [place], [address] Phone: [phone]   Fax:  [phone]

## 2015-05-05 ENCOUNTER — Ambulatory Visit: Payer: No Typology Code available for payment source | Admitting: Internal Medicine

## 2015-05-06 ENCOUNTER — Encounter: Payer: Self-pay | Admitting: Physical Therapy

## 2015-05-06 ENCOUNTER — Ambulatory Visit: Payer: No Typology Code available for payment source | Attending: Sports Medicine | Admitting: Physical Therapy

## 2015-05-06 DIAGNOSIS — R29898 Other symptoms and signs involving the musculoskeletal system: Secondary | ICD-10-CM

## 2015-05-06 DIAGNOSIS — M25531 Pain in right wrist: Secondary | ICD-10-CM | POA: Insufficient documentation

## 2015-05-06 NOTE — Therapy (Signed)
[facility] [address] [place], [place], [address] Phone: [phone]   Fax:  [phone]  Physical Therapy Treatment  Patient Details  Name: [name] MRN: [id number] Date of Birth: 1960-10-27 Referring Provider:  Lorayne Marek, MD  Encounter Date: 05/06/2015      PT End of Session - 05/06/15 1202    Visit Number 2   Date for PT Re-Evaluation 06/16/15   PT Start Time 1154   PT Stop Time 1230   PT Time Calculation (min) 36 min   Activity Tolerance Patient tolerated treatment well   Behavior During Therapy [facility] for tasks assessed/performed      Past Medical History  Diagnosis Date  . Allergy   . Anemia   . GERD (gastroesophageal reflux disease)   . Migraines   . Shingles   . Atrial fibrillation     Past Surgical History  Procedure Laterality Date  . Ovarian cyst removal  1995    dr Edwyna Ready    There were no vitals filed for this visit.  Visit Diagnosis:  Weakness of wrist  Right wrist pain      Subjective Assessment - 05/06/15 1200    Subjective Pt reports may noticed a little less severity in Right wrist. she injured her Rt wrist and heard an audible pop two month ago. 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   Currently in Pain? Yes   Pain Score 6   no pain with rest   Pain Location Wrist   Pain Orientation Right   Pain Descriptors / Indicators Sharp;Shooting   Pain Type Acute pain   Pain Onset More than a month ago   Pain Frequency Intermittent   Multiple Pain Sites No                         [facility] Adult PT Treatment/Exercise - 05/06/15 0001    Modalities   Modalities Ultrasound   Ultrasound   Ultrasound Location Rt wrist   Ultrasound Parameters 100%, 3Mhz, 0.8W/cm, x 6 min   Ultrasound Goals Pain   Manual Therapy   Manual Therapy Joint mobilization;Soft tissue mobilization  Soft tissue to Rt forearm with focus on extensors with PROM    Manual therapy comments traction to wrist, mob carpal bones and ulna/radius   Kinesiotex Facilitate Muscle   Kinesiotix   Facilitate Muscle  for right wrist extensors                  PT Short Term Goals - 05/06/15 1243    PT SHORT TERM GOAL #1   Title pain with activity decreased >/= 25%   Time 4   Period Weeks   Status On-going   PT SHORT TERM GOAL #2   Title able to take spling of for 3 hours due to reduced pain   Time 4   Period Weeks   Status On-going   PT SHORT TERM GOAL #3   Title eat and prepare food with pain decreased >/= 25%.   Time 4   Period Weeks   Status On-going   PT SHORT TERM GOAL #4   Title perform work tasks with >/= 25% decreased pain   Time 4   Period Weeks   Status On-going           PT Long Term Goals - 05/06/15 1244    PT LONG TERM GOAL #1   Title able to work with pain  decreased >/= 75%   Time 8   Period Weeks   Status On-going   PT LONG TERM GOAL #2   Title grip strength >/= 50 pounds so she can perform hand activities without difficulty   Time 8   Period Weeks   Status On-going   PT LONG TERM GOAL #3   Title right wrist strength 5/5 so she can perform her daily tasks with greater ease   Time 8   Period Weeks   Status On-going   PT LONG TERM GOAL #4   Title able to take her splint off for all activities with the exception of heavy lifting.    Time 8   Period Weeks   Status On-going               Plan - 05/06/15 1203    Pt will benefit from skilled therapeutic intervention in order to improve on the following deficits Decreased range of motion;Pain;Decreased endurance;Decreased activity tolerance;Impaired flexibility;Decreased strength;Decreased mobility   Rehab Potential Excellent   PT Frequency 2x / week   PT Duration 8 weeks   PT Treatment/Interventions Cryotherapy;Electrical Stimulation;Ultrasound;Moist Heat;Therapeutic activities;Therapeutic exercise;Neuromuscular re-education;Manual  techniques;Patient/family education;Passive range of motion;Taping   PT Next Visit Plan Ultrasound, softtissue work, kinesiotape, ROM exercise   PT Home Exercise Plan ROM exercises   Consulted and Agree with Plan of Care Patient        Problem List Patient Active Problem List   Diagnosis Date Noted  . De Quervain's tenosynovitis, right 04/10/2015  . Palpitations 11/13/2014  . PAF (paroxysmal atrial fibrillation) 11/13/2014  . Pain in joint, shoulder region 02/22/2014  . Aortic insufficiency 01/29/2014  . Atrial fibrillation 01/09/2014  . Atrial fibrillation with RVR 01/09/2014  . Chest pain 01/09/2014  . A-fib 01/09/2014  . OTHER AND UNSPECIFIED OVARIAN CYST 09/08/2010  . GERD 08/10/2010  . Anxiety state 02/02/2010    NAUMANN-HOUEGNIFIO,Earland Reish PTA 05/06/2015, 12:52 PM  [facility] [address] [place], [place], [address] Phone: [phone]   Fax:  [phone]

## 2015-05-07 ENCOUNTER — Ambulatory Visit: Payer: No Typology Code available for payment source | Attending: Internal Medicine | Admitting: Internal Medicine

## 2015-05-07 ENCOUNTER — Encounter: Payer: Self-pay | Admitting: Internal Medicine

## 2015-05-07 VITALS — BP 111/72 | HR 74 | Temp 98.3°F | Resp 16 | Wt 185.6 lb

## 2015-05-07 DIAGNOSIS — Z872 Personal history of diseases of the skin and subcutaneous tissue: Secondary | ICD-10-CM

## 2015-05-07 DIAGNOSIS — Z86018 Personal history of other benign neoplasm: Secondary | ICD-10-CM

## 2015-05-07 DIAGNOSIS — Z79899 Other long term (current) drug therapy: Secondary | ICD-10-CM | POA: Insufficient documentation

## 2015-05-07 DIAGNOSIS — K219 Gastro-esophageal reflux disease without esophagitis: Secondary | ICD-10-CM | POA: Insufficient documentation

## 2015-05-07 DIAGNOSIS — M25531 Pain in right wrist: Secondary | ICD-10-CM | POA: Insufficient documentation

## 2015-05-07 DIAGNOSIS — D239 Other benign neoplasm of skin, unspecified: Secondary | ICD-10-CM | POA: Insufficient documentation

## 2015-05-07 DIAGNOSIS — Z7982 Long term (current) use of aspirin: Secondary | ICD-10-CM | POA: Insufficient documentation

## 2015-05-07 DIAGNOSIS — M79671 Pain in right foot: Secondary | ICD-10-CM

## 2015-05-07 DIAGNOSIS — M7731 Calcaneal spur, right foot: Secondary | ICD-10-CM

## 2015-05-07 NOTE — Progress Notes (Signed)
Patient complains of still having right heel pain Patient is also requesting a referral to dermatology to  See Dr Evert Kohl who she has seen in the past

## 2015-05-07 NOTE — Patient Instructions (Signed)
Plantar Fasciitis  Plantar fasciitis is a common condition that causes foot pain. It is soreness (inflammation) of the band of tough fibrous tissue on the bottom of the foot that runs from the heel bone (calcaneus) to the ball of the foot. The cause of this soreness may be from excessive standing, poor fitting shoes, running on hard surfaces, being overweight, having an abnormal walk, or overuse (this is common in runners) of the painful foot or feet. It is also common in aerobic exercise dancers and ballet dancers.  SYMPTOMS   Most people with plantar fasciitis complain of:   Severe pain in the morning on the bottom of their foot especially when taking the first steps out of bed. This pain recedes after a few minutes of walking.   Severe pain is experienced also during walking following a long period of inactivity.   Pain is worse when walking barefoot or up stairs  DIAGNOSIS    Your caregiver will diagnose this condition by examining and feeling your foot.   Special tests such as X-rays of your foot, are usually not needed.  PREVENTION    Consult a sports medicine professional before beginning a new exercise program.   Walking programs offer a good workout. With walking there is a lower chance of overuse injuries common to runners. There is less impact and less jarring of the joints.   Begin all new exercise programs slowly. If problems or pain develop, decrease the amount of time or distance until you are at a comfortable level.   Wear good shoes and replace them regularly.   Stretch your foot and the heel cords at the back of the ankle (Achilles tendon) both before and after exercise.   Run or exercise on even surfaces that are not hard. For example, asphalt is better than pavement.   Do not run barefoot on hard surfaces.   If using a treadmill, vary the incline.   Do not continue to workout if you have foot or joint problems. Seek professional help if they do not improve.  HOME CARE INSTRUCTIONS     Avoid activities that cause you pain until you recover.   Use ice or cold packs on the problem or painful areas after working out.   Only take over-the-counter or prescription medicines for pain, discomfort, or fever as directed by your caregiver.   Soft shoe inserts or athletic shoes with air or gel sole cushions may be helpful.   If problems continue or become more severe, consult a sports medicine caregiver or your own health care provider. Cortisone is a potent anti-inflammatory medication that may be injected into the painful area. You can discuss this treatment with your caregiver.  MAKE SURE YOU:    Understand these instructions.   Will watch your condition.   Will get help right away if you are not doing well or get worse.  Document Released: 07/06/2001 Document Revised: 01/03/2012 Document Reviewed: 09/04/2008  ExitCare Patient Information 2015 [employer]. This information is not intended to replace advice given to you by your health care provider. Make sure you discuss any questions you have with your health care provider.

## 2015-05-07 NOTE — Progress Notes (Signed)
MRN: [id number] Name: [name]  Sex: female Age: 54 y.o. DOB: [birth year]  Allergies: Sausage and Sulfamethoxazole  Chief Complaint  Patient presents with  . Foot Pain    HPI: Patient is 54 y.o. female who has history of A. Fib currently on aspirin and metoprolol, also has been following up with sports medicine for the right wrist pain currently going to physical therapy, she also has right foot heel pain on and off, had x-ray done which reported heel spur, she also has history of dysplastic nevus had biopsy done 2-3 years ago, she is requesting referral to have her follow with dermatology since she has family history of melanoma.  Past Medical History  Diagnosis Date  . Allergy   . Anemia   . GERD (gastroesophageal reflux disease)   . Migraines   . Shingles   . Atrial fibrillation     Past Surgical History  Procedure Laterality Date  . Ovarian cyst removal  1995    dr Edwyna Ready      Medication List       This list is accurate as of: 05/07/15  4:23 PM.  Always use your most recent med list.               ascorbic acid 500 MG tablet  Commonly known as:  VITAMIN C  Take 500 mg by mouth daily.     aspirin 81 MG chewable tablet  Chew 162 mg by mouth at bedtime.     b complex vitamins tablet  Take 1 tablet by mouth daily.     co-enzyme Q-10 50 MG capsule  Take 50 mg by mouth daily.     ferrous sulfate 325 (65 FE) MG EC tablet  Take 325 mg by mouth daily with breakfast.     Magnesium 400 MG Caps  Take 1 capsule by mouth daily.     metoprolol succinate 25 MG 24 hr tablet  Commonly known as:  TOPROL-XL  Take 12.5 mg by mouth 2 (two) times daily.     OVER THE COUNTER MEDICATION  Take 5 mLs by mouth daily. Omega 3 (with DHA) liquid     PROBIOTIC FORMULA PO  Take 1 tablet by mouth daily.        No orders of the defined types were placed in this encounter.    Immunization History  Administered Date(s) Administered  . Influenza Split  06/21/2014  . Influenza Whole 07/09/2010  . Influenza,inj,Quad PF,36+ Mos 06/29/2013  . Td 05/14/2010    Family History  Problem Relation Age of Onset  . Heart disease Mother   . Cancer Father 39    lung ca  . Heart disease Father 59    mi/cabg  . Colon cancer Cousin 30    History  Substance Use Topics  . Smoking status: Never Smoker   . Smokeless tobacco: Never Used  . Alcohol Use: No    Review of Systems   As noted in HPI  Filed Vitals:   05/07/15 1419  BP: 111/72  Pulse: 74  Temp: 98.3 F (36.8 C)  Resp: 16    Physical Exam  Physical Exam  Constitutional: No distress.  Eyes: EOM are normal. Pupils are equal, round, and reactive to light.  Cardiovascular: Normal rate and regular rhythm.   Pulmonary/Chest: Breath sounds normal. No respiratory distress. She has no wheezes. She has no rales.  Musculoskeletal:  Right heel minimal tenderness    CBC    Component Value Date/Time  WBC 7.1 11/13/2014 1303   WBC 9.6 11/22/2013 1426   RBC 4.36 11/13/2014 1303   RBC 4.16 11/22/2013 1426   HGB 13.0 11/13/2014 1303   HGB 12.2 11/22/2013 1426   HCT 38.8 11/13/2014 1303   HCT 40.6 11/22/2013 1426   PLT 298 11/13/2014 1303   MCV 89.0 11/13/2014 1303   MCV 97.6* 11/22/2013 1426   LYMPHSABS 1.2 11/13/2014 1303   MONOABS 0.5 11/13/2014 1303   EOSABS 0.2 11/13/2014 1303   BASOSABS 0.0 11/13/2014 1303    CMP     Component Value Date/Time   NA 140 11/13/2014 1303   K 3.6 11/13/2014 1303   CL 103 11/13/2014 1303   CO2 28 11/13/2014 1303   GLUCOSE 83 11/13/2014 1303   BUN 14 11/13/2014 1303   CREATININE 0.57 11/13/2014 1303   CREATININE 0.62 09/05/2013 1042   CALCIUM 9.5 11/13/2014 1303   PROT 8.1 11/13/2014 1303   ALBUMIN 4.0 11/13/2014 1303   AST 30 11/13/2014 1303   ALT 25 11/13/2014 1303   ALKPHOS 79 11/13/2014 1303   BILITOT 0.6 11/13/2014 1303   GFRNONAA >90 11/13/2014 1303   GFRNONAA >89 09/05/2013 1042   GFRAA >90 11/13/2014 1303   GFRAA >89  09/05/2013 1042    Lab Results  Component Value Date/Time   CHOL 191 05/16/2014 10:35 AM    Lab Results  Component Value Date/Time   HGBA1C 5.3 01/25/2014 10:48 AM    Lab Results  Component Value Date/Time   AST 30 11/13/2014 01:03 PM    Assessment and Plan  Heel pain, right/Calcaneal spur, right - Plan: pain medication when necessary, also given information about plantar fasciitis Ambulatory referral to Podiatry  History of dysplastic nevus - Plan: Ambulatory referral to Dermatology   Return in about 3 months (around 08/07/2015), or if symptoms worsen or fail to improve.   This note has been created with Surveyor, quantity. Any transcriptional errors are unintentional.    Lorayne Marek, MD

## 2015-05-08 ENCOUNTER — Encounter: Payer: Self-pay | Admitting: Physical Therapy

## 2015-05-08 ENCOUNTER — Ambulatory Visit: Payer: No Typology Code available for payment source | Admitting: Physical Therapy

## 2015-05-08 DIAGNOSIS — R29898 Other symptoms and signs involving the musculoskeletal system: Secondary | ICD-10-CM

## 2015-05-08 DIAGNOSIS — M25531 Pain in right wrist: Secondary | ICD-10-CM

## 2015-05-08 NOTE — Patient Instructions (Signed)
Finger Flexors   Squeeze ball 10 x 3 times per day  Copyright  VHI. All rights reserved.  AROM: Wrist Radial / Ulnar Deviation   Gently bend left wrist from side to side as far as possible. Repeat _10___ times per set. Do _1___ sets per session. Do ____ sessions per day.  Copyright  VHI. All rights reserved.  AROM: Wrist Extension   With right palm down, bend wrist up. Repeat __10__ times per set. Do ___1_ sets per session. Do _3___ sessions per day.  Copyright  VHI. All rights reserved.  AROM: Wrist Flexion   With right palm up, bend wrist up. Repeat _10___ times per set. Do _1___ sets per session. Do _3___ sessions per day.  Copyright  VHI. All rights reserved.  [place] [address] [place], [place] [address] Phone # [phone] Fax [phone]

## 2015-05-08 NOTE — Therapy (Signed)
[facility] [address], [place], [place], [address] Phone: [phone]   Fax:  [phone]  Physical Therapy Treatment  Patient Details  Name: [name] MRN: [id number] Date of Birth: 10-05-1961 Referring Provider:  Thurman Coyer, DO  Encounter Date: 05/08/2015      PT End of Session - 05/08/15 1311    Visit Number 3   Date for PT Re-Evaluation 06/16/15   PT Start Time 1230   PT Stop Time 1310   PT Time Calculation (min) 40 min   Activity Tolerance Patient tolerated treatment well   Behavior During Therapy [facility] for tasks assessed/performed      Past Medical History  Diagnosis Date  . Allergy   . Anemia   . GERD (gastroesophageal reflux disease)   . Migraines   . Shingles   . Atrial fibrillation     Past Surgical History  Procedure Laterality Date  . Ovarian cyst removal  1995    dr Edwyna Ready    There were no vitals filed for this visit.  Visit Diagnosis:  Weakness of wrist  Right wrist pain      Subjective Assessment - 05/08/15 1237    Subjective the tape did not help.  I am not sure the ultrasound helped.  Only hurts with movement.    Patient Stated Goals Patient wants to use right wrist.    Currently in Pain? Yes   Pain Score 6    Pain Location Wrist   Pain Orientation Right   Pain Descriptors / Indicators Sharp;Shooting   Pain Type Acute pain   Pain Onset More than a month ago   Pain Frequency Intermittent   Aggravating Factors  open jar, brush teeth, use right hand, do activities without brace, pick up her dog   Pain Relieving Factors wear brace and not use it   Multiple Pain Sites No            [facility] PT Assessment - 05/08/15 0001    Precautions   Precautions Other (comment)  no electrical stimulation                     [facility] Adult PT Treatment/Exercise - 05/08/15 0001    Modalities   Modalities Ultrasound   Ultrasound   Ultrasound Location  lateral right wrist   Ultrasound Parameters 20% 1 mhz, 8 min., 0/8w/cm2   Ultrasound Goals Pain   Manual Therapy   Manual Therapy Soft tissue mobilization;Joint mobilization;Passive ROM   Joint Mobilization right wrist for anterior and posterior glide, carpal mobilization    Soft tissue mobilization right wrist and right thumb extensor tendon   Passive ROM right wrist for all motions in painfree range                PT Education - 05/08/15 1310    Education provided Yes   Education Details wrist ROM exercises, grip   Person(s) Educated Patient   Methods Explanation;Demonstration;Tactile cues;Verbal cues;Handout   Comprehension Returned demonstration;Verbalized understanding          PT Short Term Goals - 05/06/15 1243    PT SHORT TERM GOAL #1   Title pain with activity decreased >/= 25%   Time 4   Period Weeks   Status On-going   PT SHORT TERM GOAL #2   Title able to take spling of for 3 hours due to reduced pain   Time 4   Period Weeks   Status On-going  PT SHORT TERM GOAL #3   Title eat and prepare food with pain decreased >/= 25%.   Time 4   Period Weeks   Status On-going   PT SHORT TERM GOAL #4   Title perform work tasks with >/= 25% decreased pain   Time 4   Period Weeks   Status On-going           PT Long Term Goals - 05/06/15 1244    PT LONG TERM GOAL #1   Title able to work with pain decreased >/= 75%   Time 8   Period Weeks   Status On-going   PT LONG TERM GOAL #2   Title grip strength >/= 50 pounds so she can perform hand activities without difficulty   Time 8   Period Weeks   Status On-going   PT LONG TERM GOAL #3   Title right wrist strength 5/5 so she can perform her daily tasks with greater ease   Time 8   Period Weeks   Status On-going   PT LONG TERM GOAL #4   Title able to take her splint off for all activities with the exception of heavy lifting.    Time 8   Period Weeks   Status On-going               Plan -  05/08/15 1312    Clinical Impression Statement Patient is a 54 year old female with diagonsis of right DeQuervains tenosynovitis.  Patient has no change in pain with tape.  Patient was able to have full ROM after therapy.  Patient has tenderness locted on right thumb extensor tendon on lateral side of right wrist.  Patient has been using right wrist more. Patient benefits from physical therapy to improve right wrist ROM and strength and decrease pain.    Pt will benefit from skilled therapeutic intervention in order to improve on the following deficits Decreased range of motion;Pain;Decreased endurance;Decreased activity tolerance;Impaired flexibility;Decreased strength;Decreased mobility   Rehab Potential Excellent   Clinical Impairments Affecting Rehab Potential None   PT Frequency 2x / week   PT Duration 8 weeks   PT Treatment/Interventions Cryotherapy;Electrical Stimulation;Ultrasound;Moist Heat;Therapeutic activities;Therapeutic exercise;Neuromuscular re-education;Manual techniques;Patient/family education;Passive range of motion;Taping   PT Next Visit Plan ultrasound, soft tissue work, wrist isometrics   PT Home Exercise Plan wrist isometrics   Consulted and Agree with Plan of Care Patient        Problem List Patient Active Problem List   Diagnosis Date Noted  . De Quervain's tenosynovitis, right 04/10/2015  . Palpitations 11/13/2014  . PAF (paroxysmal atrial fibrillation) 11/13/2014  . Pain in joint, shoulder region 02/22/2014  . Aortic insufficiency 01/29/2014  . Atrial fibrillation 01/09/2014  . Atrial fibrillation with RVR 01/09/2014  . Chest pain 01/09/2014  . A-fib 01/09/2014  . OTHER AND UNSPECIFIED OVARIAN CYST 09/08/2010  . GERD 08/10/2010  . Anxiety state 02/02/2010    GRAY,CHERYL,PT 05/08/2015, 1:15 PM  [facility] [address] [place], [place], [address] Phone: [phone]   Fax:   [phone]

## 2015-05-12 ENCOUNTER — Ambulatory Visit: Payer: No Typology Code available for payment source | Admitting: Physical Therapy

## 2015-05-14 ENCOUNTER — Ambulatory Visit: Payer: No Typology Code available for payment source | Admitting: Physical Therapy

## 2015-05-14 ENCOUNTER — Encounter: Payer: Self-pay | Admitting: Physical Therapy

## 2015-05-14 DIAGNOSIS — M25531 Pain in right wrist: Secondary | ICD-10-CM

## 2015-05-14 DIAGNOSIS — R29898 Other symptoms and signs involving the musculoskeletal system: Secondary | ICD-10-CM

## 2015-05-14 NOTE — Therapy (Signed)
[facility] [address] [place], [place], [address] Phone: [phone]   Fax:  [phone]  Physical Therapy Treatment  Patient Details  Name: [name] MRN: [id number] Date of Birth: Jul 30, 1961 Referring Provider:  Thurman Coyer, DO  Encounter Date: 05/14/2015      PT End of Session - 05/14/15 1336    Visit Number 4   Date for PT Re-Evaluation 06/16/15   PT Start Time 1147   PT Stop Time 1230   PT Time Calculation (min) 43 min   Activity Tolerance Patient tolerated treatment well   Behavior During Therapy [facility] for tasks assessed/performed      Past Medical History  Diagnosis Date  . Allergy   . Anemia   . GERD (gastroesophageal reflux disease)   . Migraines   . Shingles   . Atrial fibrillation     Past Surgical History  Procedure Laterality Date  . Ovarian cyst removal  1995    dr Edwyna Ready    There were no vitals filed for this visit.  Visit Diagnosis:  Weakness of wrist  Right wrist pain      Subjective Assessment - 05/14/15 1332    Subjective Pt reports feeling 25% better, since last session. Certain movement are triggering the pain in Rt wrist/ forearm    Currently in Pain? Yes   Pain Score 2    Pain Location Wrist   Pain Orientation Right   Pain Descriptors / Indicators Sharp;Shooting   Pain Type Acute pain   Pain Onset More than a month ago   Pain Frequency Intermittent   Aggravating Factors  turn ignitian in the car, open doors, open a jar, pick up the dog, reaching for seatbelt   Multiple Pain Sites No                         [facility] Adult PT Treatment/Exercise - 05/14/15 0001    Exercises   Exercises Wrist   Wrist Exercises   Forearm Supination AROM;Right;10 reps   Forearm Pronation AROM;10 reps;Right   Wrist Flexion Strengthening  with small ball   Wrist Extension AROM;Right;10 reps   Modalities   Modalities Ultrasound   Ultrasound   Ultrasound Location Rt wrist   Ultrasound Parameters 20%, 3Mhz, 0.8 W/cm   Ultrasound Goals Pain   Manual Therapy   Manual Therapy Soft tissue mobilization;Joint mobilization;Passive ROM   Joint Mobilization right wrist for anterior and posterior glide, carpal mobilization    Soft tissue mobilization right wrist and right thumb extensor tendon   Kinesiotex Facilitate Muscle   Kinesiotix   Facilitate Muscle  for right wrist extensors                  PT Short Term Goals - 05/14/15 1342    PT SHORT TERM GOAL #1   Title pain with activity decreased >/= 25%   Time 4   Period Weeks   Status On-going   PT SHORT TERM GOAL #2   Title able to take spling of for 3 hours due to reduced pain   Time 4   Period Weeks   Status On-going   PT SHORT TERM GOAL #3   Title eat and prepare food with pain decreased >/= 25%.   Time 4   Period Weeks   Status On-going   PT SHORT TERM GOAL #4   Title perform work tasks with >/= 25% decreased pain   Time  4   Period Weeks   Status On-going           PT Long Term Goals - 05/06/15 1244    PT LONG TERM GOAL #1   Title able to work with pain decreased >/= 75%   Time 8   Period Weeks   Status On-going   PT LONG TERM GOAL #2   Title grip strength >/= 50 pounds so she can perform hand activities without difficulty   Time 8   Period Weeks   Status On-going   PT LONG TERM GOAL #3   Title right wrist strength 5/5 so she can perform her daily tasks with greater ease   Time 8   Period Weeks   Status On-going   PT LONG TERM GOAL #4   Title able to take her splint off for all activities with the exception of heavy lifting.    Time 8   Period Weeks   Status On-going               Plan - 05/14/15 1338    Clinical Impression Statement Pt is a 54 year old female with diagnosis of right DeQuervains tensynovitis. Pt is righ hand dominant: Handdynamometer Rt 70#, 68# x 2, Lt 53#, 60#, 59#. Patient will continue to benefit from Pt to  strength and decrease pain in Rt wrist.    Pt will benefit from skilled therapeutic intervention in order to improve on the following deficits Decreased range of motion;Pain;Decreased endurance;Decreased activity tolerance;Impaired flexibility;Decreased strength;Decreased mobility   Rehab Potential Excellent   PT Frequency 2x / week   PT Duration 8 weeks   PT Treatment/Interventions Cryotherapy;Electrical Stimulation;Ultrasound;Moist Heat;Therapeutic activities;Therapeutic exercise;Neuromuscular re-education;Manual techniques;Patient/family education;Passive range of motion;Taping   PT Next Visit Plan Continue ultrasound, kinesiotaping, softtissue work   [employer] and Agree with Plan of Care Patient        Problem List Patient Active Problem List   Diagnosis Date Noted  . De Quervain's tenosynovitis, right 04/10/2015  . Palpitations 11/13/2014  . PAF (paroxysmal atrial fibrillation) 11/13/2014  . Pain in joint, shoulder region 02/22/2014  . Aortic insufficiency 01/29/2014  . Atrial fibrillation 01/09/2014  . Atrial fibrillation with RVR 01/09/2014  . Chest pain 01/09/2014  . A-fib 01/09/2014  . OTHER AND UNSPECIFIED OVARIAN CYST 09/08/2010  . GERD 08/10/2010  . Anxiety state 02/02/2010    NAUMANN-HOUEGNIFIO,Bayle Calvo PTA 05/14/2015, 1:47 PM  [facility] [address] [place], [place], [address] Phone: [phone]   Fax:  [phone]

## 2015-05-20 ENCOUNTER — Encounter: Payer: Self-pay | Admitting: Physical Therapy

## 2015-05-20 ENCOUNTER — Ambulatory Visit: Payer: No Typology Code available for payment source | Admitting: Physical Therapy

## 2015-05-20 DIAGNOSIS — R29898 Other symptoms and signs involving the musculoskeletal system: Secondary | ICD-10-CM

## 2015-05-20 DIAGNOSIS — M25531 Pain in right wrist: Secondary | ICD-10-CM

## 2015-05-20 NOTE — Therapy (Signed)
[facility] [address], [place], [place], [address] Phone: [phone]   Fax:  [phone]  Physical Therapy Treatment  Patient Details  Name: [name] MRN: [id number] Date of Birth: 1961/02/21 Referring Provider:  Thurman Coyer, DO  Encounter Date: 05/20/2015      PT End of Session - 05/20/15 1023    Visit Number 5   Date for PT Re-Evaluation 06/16/15   PT Start Time 5697   PT Stop Time 1055   PT Time Calculation (min) 40 min   Activity Tolerance Patient tolerated treatment well   Behavior During Therapy [facility] for tasks assessed/performed      Past Medical History  Diagnosis Date  . Allergy   . Anemia   . GERD (gastroesophageal reflux disease)   . Migraines   . Shingles   . Atrial fibrillation     Past Surgical History  Procedure Laterality Date  . Ovarian cyst removal  1995    dr Edwyna Ready    There were no vitals filed for this visit.  Visit Diagnosis:  Weakness of wrist  Right wrist pain      Subjective Assessment - 05/20/15 1021    Subjective I feel better.  I notice specific time with less pain.  I can do more without pain.  I can turn on my car with less pain.  I can open jars and grip items with less pain. I got a shorter brace.    Patient Stated Goals Patient wants to use right wrist.    Currently in Pain? Yes   Pain Score 5    Pain Location Wrist   Pain Orientation Right   Pain Descriptors / Indicators Sharp;Shooting   Pain Type Acute pain   Pain Onset More than a month ago   Pain Frequency Intermittent   Aggravating Factors  open doors, picking up dog, reaching for seatbelt   Pain Relieving Factors wear brace   Multiple Pain Sites No            [facility] PT Assessment - 05/20/15 0001    Strength   Overall Strength Comments right elbow extension 4+/5   Strength Assessment Site Wrist   Right/Left Wrist Right   Right Wrist Flexion 4-/5  pain   Right Wrist Extension  4+/5   Right Wrist Radial Deviation 3+/5  pain   Right Wrist Ulnar Deviation 5/5                     [facility] Adult PT Treatment/Exercise - 05/20/15 0001    Elbow Exercises   Wrist Flexion Strengthening;Right;10 reps  to midrange before pain 1#   Wrist Extension Strengthening;Right;10 reps;Bar weights/barbell  2 sets   Theraband Level (Wrist Extension) --  10 times without weight   Bar Weights/Barbell (Wrist Extension) 1 lb   Wrist Exercises   Wrist Ulnar Deviation Limitations no weight with tactile cues to prevent extension 3x10   Other wrist exercises resistance to right thumb with fan out movement 10x3  painfree range   Modalities   Modalities Ultrasound   Ultrasound   Ultrasound Location right wrist   Ultrasound Parameters 20%, 3 mhz, 0.8w/cm2, 6 min.   biofreeze   Ultrasound Goals Pain   Manual Therapy   Manual Therapy Soft tissue mobilization;Joint mobilization;Passive ROM   Joint Mobilization right wrist for anterior and posterior glide, carpal mobilization    Soft tissue mobilization right wrist and right thumb extensor tendon   Kinesiotex  Facilitate Muscle                  PT Short Term Goals - 05/20/15 1044    PT SHORT TERM GOAL #1   Title pain with activity decreased >/= 25%   Time 4   Period Weeks   Status Achieved   PT SHORT TERM GOAL #2   Title able to take spling of for 3 hours due to reduced pain   Time 4   Period Weeks   Status Achieved   PT SHORT TERM GOAL #3   Title eat and prepare food with pain decreased >/= 25%.   Time 4   Period Weeks   Status Achieved   PT SHORT TERM GOAL #4   Title perform work tasks with >/= 25% decreased pain   Time 4   Period Weeks   Status On-going  lifting 15% better           PT Long Term Goals - 05/06/15 1244    PT LONG TERM GOAL #1   Title able to work with pain decreased >/= 75%   Time 8   Period Weeks   Status On-going   PT LONG TERM GOAL #2   Title grip strength >/= 50 pounds  so she can perform hand activities without difficulty   Time 8   Period Weeks   Status On-going   PT LONG TERM GOAL #3   Title right wrist strength 5/5 so she can perform her daily tasks with greater ease   Time 8   Period Weeks   Status On-going   PT LONG TERM GOAL #4   Title able to take her splint off for all activities with the exception of heavy lifting.    Time 8   Period Weeks   Status On-going               Plan - 05/20/15 1046    Clinical Impression Statement Patient has 25% decrease in pain with activities with exception of work with 15% decreased in pain with lifting.  Patient has met STG # 1,2,3. Patient able to do radial deviation with tactile cues to prevent wrist extension. Patient elbow strength, wrist extension and ulnar deviation has improved.  Patient has pain with wrist flexion and ulnar deviation. Patient will benefit from  physical therapy to guide her in strengthening and monitor with pain.    Pt will benefit from skilled therapeutic intervention in order to improve on the following deficits Decreased range of motion;Pain;Decreased endurance;Decreased activity tolerance;Impaired flexibility;Decreased strength;Decreased mobility   Rehab Potential Excellent   Clinical Impairments Affecting Rehab Potential None   PT Frequency 2x / week   PT Duration 8 weeks   PT Treatment/Interventions Cryotherapy;Electrical Stimulation;Ultrasound;Moist Heat;Therapeutic activities;Therapeutic exercise;Neuromuscular re-education;Manual techniques;Patient/family education;Passive range of motion;Taping   PT Next Visit Plan ultrasound with biofreeze, soft tissue work, strengthening   PT Home Exercise Plan progress as needed   Consulted and Agree with Plan of Care Patient        Problem List Patient Active Problem List   Diagnosis Date Noted  . De Quervain's tenosynovitis, right 04/10/2015  . Palpitations 11/13/2014  . PAF (paroxysmal atrial fibrillation) 11/13/2014  .  Pain in joint, shoulder region 02/22/2014  . Aortic insufficiency 01/29/2014  . Atrial fibrillation 01/09/2014  . Atrial fibrillation with RVR 01/09/2014  . Chest pain 01/09/2014  . A-fib 01/09/2014  . OTHER AND UNSPECIFIED OVARIAN CYST 09/08/2010  . GERD 08/10/2010  . Anxiety state 02/02/2010  GRAY,CHERYL ,PT  05/20/2015, 10:56 AM  [facility] [address] [place], [place], [address] Phone: [phone]   Fax:  [phone]

## 2015-05-21 ENCOUNTER — Encounter: Payer: Self-pay | Admitting: Podiatry

## 2015-05-21 ENCOUNTER — Ambulatory Visit (INDEPENDENT_AMBULATORY_CARE_PROVIDER_SITE_OTHER): Payer: No Typology Code available for payment source | Admitting: Podiatry

## 2015-05-21 VITALS — BP 114/70 | HR 68 | Temp 98.2°F | Resp 14

## 2015-05-21 DIAGNOSIS — M722 Plantar fascial fibromatosis: Secondary | ICD-10-CM

## 2015-05-21 NOTE — Progress Notes (Signed)
   Subjective:    Patient ID: [name], female    DOB: [birth year], 54 y.o.   MRN: [id number]  HPI   This patient presents today complaining of pain in her right heel and arch noticeable on first step in the morning and increasing with standing and walking and relieved with rest. She denies any direct injury or change of activity. She has placed a soft heel cup in the shoe as well as soaking in Epsom salts which has reduced some of the symptoms slightly. She also says that because the heel arch pain has become so uncomfortable it forces her to walk in a different manner placing more weight on the ball area of the right foot which is cause some discomfort in that area. She describes having an x-ray taken in April and was told that she had a heel spur. Also, patient was given permission to have a cortisone injection into her wrist by her cardiologist , however, she did not have the cortisone injection as his symptoms improve with physical therapy.    Review of Systems  Cardiovascular: Positive for palpitations.  Musculoskeletal: Positive for gait problem.  Allergic/Immunologic: Positive for food allergies.  Hematological: Bruises/bleeds easily.       Objective:   Physical Exam  Orientated 3  Vascular: No peripheral edema bilaterally DP and PT pulses 2/4 bilaterally Capillary reflex immediate bilaterally  Neurological: Sensation to 10 g monofilament wire intact 5/5 bilaterally Vibratory sensation reactive bilaterally Ankle reflex equal and reactive bilaterally  Dermatological: Texture and turgor within normal limits  Musculoskeletal: Palpable tenderness proximal medial fascial band, central fascial band in the heel area and lateral fascial band right without any palpable lesions. There is no palpable tenderness when the calcaneus skin compressed manually medial and laterally. These areas duplicates areas of discomfort. Patient has a limping fashion favoring the right  foot when she changed from seated to standing position. X-ray dated 02/08/2015 demonstrated inferior calcaneal spur without fracture or dislocation      Assessment & Plan:   Assessment: Satisfactory neurovascular status Plantar fasciitis right  Plan: I reviewed the results of patient's examination and previous x-ray with patient in detail. I explained plantar fasciitis in some treatment options to her. At this time I offered patient still right injection and she's been given previous approval by her cardiologist to have a sterile injection. Patient verbally consents  Skin is prepped with alcohol and Betadine and 10 mg of Kenalog mixed with 10 g of plain Xylocaine and 2.5 mg of plain Marcaine were injected into the inferior right heel for Kenalog injection #1. Patient tolerated procedure without any difficulty. I reviewed possible postinjection problems including injection site pain, facial erythema or reddening of the eyes.  A plantar fasciitis strap was dispensed to wear in the right foot I discussed shoeing and stretching in detail  Patient advised to return in the next 6 weeks

## 2015-05-21 NOTE — Patient Instructions (Signed)
Plantar Fasciitis (Heel Spur Syndrome) with Rehab The plantar fascia is a fibrous, ligament-like, soft-tissue structure that spans the bottom of the foot. Plantar fasciitis is a condition that causes pain in the foot due to inflammation of the tissue. SYMPTOMS   Pain and tenderness on the underneath side of the foot.  Pain that worsens with standing or walking. CAUSES  Plantar fasciitis is caused by irritation and injury to the plantar fascia on the underneath side of the foot. Common mechanisms of injury include:  Direct trauma to bottom of the foot.  Damage to a small nerve that runs under the foot where the main fascia attaches to the heel bone.  Stress placed on the plantar fascia due to bone spurs. RISK INCREASES WITH:   Activities that place stress on the plantar fascia (running, jumping, pivoting, or cutting).  Poor strength and flexibility.  Improperly fitted shoes.  Tight calf muscles.  Flat feet.  Failure to warm-up properly before activity.  Obesity. PREVENTION  Warm up and stretch properly before activity.  Allow for adequate recovery between workouts.  Maintain physical fitness:  Strength, flexibility, and endurance.  Cardiovascular fitness.  Maintain a health body weight.  Avoid stress on the plantar fascia.  Wear properly fitted shoes, including arch supports for individuals who have flat feet. PROGNOSIS  If treated properly, then the symptoms of plantar fasciitis usually resolve without surgery. However, occasionally surgery is necessary. RELATED COMPLICATIONS   Recurrent symptoms that may result in a chronic condition.  Problems of the lower back that are caused by compensating for the injury, such as limping.  Pain or weakness of the foot during push-off following surgery.  Chronic inflammation, scarring, and partial or complete fascia tear, occurring more often from repeated injections. TREATMENT  Treatment initially involves the use of  ice and medication to help reduce pain and inflammation. The use of strengthening and stretching exercises may help reduce pain with activity, especially stretches of the Achilles tendon. These exercises may be performed at home or with a therapist. Your caregiver may recommend that you use heel cups of arch supports to help reduce stress on the plantar fascia. Occasionally, corticosteroid injections are given to reduce inflammation. If symptoms persist for greater than 6 months despite non-surgical (conservative), then surgery may be recommended.  MEDICATION   If pain medication is necessary, then nonsteroidal anti-inflammatory medications, such as aspirin and ibuprofen, or other minor pain relievers, such as acetaminophen, are often recommended.  Do not take pain medication within 7 days before surgery.  Prescription pain relievers may be given if deemed necessary by your caregiver. Use only as directed and only as much as you need.  Corticosteroid injections may be given by your caregiver. These injections should be reserved for the most serious cases, because they may only be given a certain number of times. HEAT AND COLD  Cold treatment (icing) relieves pain and reduces inflammation. Cold treatment should be applied for 10 to 15 minutes every 2 to 3 hours for inflammation and pain and immediately after any activity that aggravates your symptoms. Use ice packs or massage the area with a piece of ice (ice massage).  Heat treatment may be used prior to performing the stretching and strengthening activities prescribed by your caregiver, physical therapist, or athletic trainer. Use a heat pack or soak the injury in warm water. SEEK IMMEDIATE MEDICAL CARE IF:  Treatment seems to offer no benefit, or the condition worsens.  Any medications produce adverse side effects. EXERCISES RANGE   OF MOTION (ROM) AND STRETCHING EXERCISES - Plantar Fasciitis (Heel Spur Syndrome) These exercises may help you  when beginning to rehabilitate your injury. Your symptoms may resolve with or without further involvement from your physician, physical therapist or athletic trainer. While completing these exercises, remember:   Restoring tissue flexibility helps normal motion to return to the joints. This allows healthier, less painful movement and activity.  An effective stretch should be held for at least 30 seconds.  A stretch should never be painful. You should only feel a gentle lengthening or release in the stretched tissue. RANGE OF MOTION - Toe Extension, Flexion  Sit with your right / left leg crossed over your opposite knee.  Grasp your toes and gently pull them back toward the top of your foot. You should feel a stretch on the bottom of your toes and/or foot.  Hold this stretch for __________ seconds.  Now, gently pull your toes toward the bottom of your foot. You should feel a stretch on the top of your toes and or foot.  Hold this stretch for __________ seconds. Repeat __________ times. Complete this stretch __________ times per day.  RANGE OF MOTION - Ankle Dorsiflexion, Active Assisted  Remove shoes and sit on a chair that is preferably not on a carpeted surface.  Place right / left foot under knee. Extend your opposite leg for support.  Keeping your heel down, slide your right / left foot back toward the chair until you feel a stretch at your ankle or calf. If you do not feel a stretch, slide your bottom forward to the edge of the chair, while still keeping your heel down.  Hold this stretch for __________ seconds. Repeat __________ times. Complete this stretch __________ times per day.  STRETCH - Gastroc, Standing  Place hands on wall.  Extend right / left leg, keeping the front knee somewhat bent.  Slightly point your toes inward on your back foot.  Keeping your right / left heel on the floor and your knee straight, shift your weight toward the wall, not allowing your back to  arch.  You should feel a gentle stretch in the right / left calf. Hold this position for __________ seconds. Repeat __________ times. Complete this stretch __________ times per day. STRETCH - Soleus, Standing  Place hands on wall.  Extend right / left leg, keeping the other knee somewhat bent.  Slightly point your toes inward on your back foot.  Keep your right / left heel on the floor, bend your back knee, and slightly shift your weight over the back leg so that you feel a gentle stretch deep in your back calf.  Hold this position for __________ seconds. Repeat __________ times. Complete this stretch __________ times per day. STRETCH - Gastrocsoleus, Standing  Note: This exercise can place a lot of stress on your foot and ankle. Please complete this exercise only if specifically instructed by your caregiver.   Place the ball of your right / left foot on a step, keeping your other foot firmly on the same step.  Hold on to the wall or a rail for balance.  Slowly lift your other foot, allowing your body weight to press your heel down over the edge of the step.  You should feel a stretch in your right / left calf.  Hold this position for __________ seconds.  Repeat this exercise with a slight bend in your right / left knee. Repeat __________ times. Complete this stretch __________ times per day.    STRENGTHENING EXERCISES - Plantar Fasciitis (Heel Spur Syndrome)  These exercises may help you when beginning to rehabilitate your injury. They may resolve your symptoms with or without further involvement from your physician, physical therapist or athletic trainer. While completing these exercises, remember:   Muscles can gain both the endurance and the strength needed for everyday activities through controlled exercises.  Complete these exercises as instructed by your physician, physical therapist or athletic trainer. Progress the resistance and repetitions only as guided. STRENGTH -  Towel Curls  Sit in a chair positioned on a non-carpeted surface.  Place your foot on a towel, keeping your heel on the floor.  Pull the towel toward your heel by only curling your toes. Keep your heel on the floor.  If instructed by your physician, physical therapist or athletic trainer, add ____________________ at the end of the towel. Repeat __________ times. Complete this exercise __________ times per day. STRENGTH - Ankle Inversion  Secure one end of a rubber exercise band/tubing to a fixed object (table, pole). Loop the other end around your foot just before your toes.  Place your fists between your knees. This will focus your strengthening at your ankle.  Slowly, pull your big toe up and in, making sure the band/tubing is positioned to resist the entire motion.  Hold this position for __________ seconds.  Have your muscles resist the band/tubing as it slowly pulls your foot back to the starting position. Repeat __________ times. Complete this exercises __________ times per day.  Document Released: 10/11/2005 Document Revised: 01/03/2012 Document Reviewed: 01/23/2009 [employer] Patient Information 2015 [employer]. This information is not intended to replace advice given to you by your health care provider. Make sure you discuss any questions you have with your health care provider.  

## 2015-05-22 ENCOUNTER — Encounter: Payer: Self-pay | Admitting: Physical Therapy

## 2015-05-22 ENCOUNTER — Ambulatory Visit: Payer: No Typology Code available for payment source | Admitting: Physical Therapy

## 2015-05-22 DIAGNOSIS — M25531 Pain in right wrist: Secondary | ICD-10-CM

## 2015-05-22 DIAGNOSIS — M722 Plantar fascial fibromatosis: Secondary | ICD-10-CM

## 2015-05-22 DIAGNOSIS — R29898 Other symptoms and signs involving the musculoskeletal system: Secondary | ICD-10-CM

## 2015-05-22 MED ORDER — TRIAMCINOLONE ACETONIDE 10 MG/ML IJ SUSP
10.0000 mg | Freq: Once | INTRAMUSCULAR | Status: AC
  Administered 2015-05-22: 10 mg

## 2015-05-22 NOTE — Therapy (Signed)
[facility] [address] [place], [place], [address] Phone: [phone]   Fax:  [phone]  Physical Therapy Treatment  Patient Details  Name: [name] MRN: [id number] Date of Birth: 1961/04/01 Referring Provider:  Thurman Coyer, DO  Encounter Date: 05/22/2015      PT End of Session - 05/22/15 1318    Visit Number 6   Date for PT Re-Evaluation 06/16/15   PT Start Time 1231   PT Stop Time 1313   PT Time Calculation (min) 42 min   Activity Tolerance Patient tolerated treatment well   Behavior During Therapy [facility] for tasks assessed/performed      Past Medical History  Diagnosis Date  . Allergy   . Anemia   . GERD (gastroesophageal reflux disease)   . Migraines   . Shingles   . Atrial fibrillation     Past Surgical History  Procedure Laterality Date  . Ovarian cyst removal  1995    dr Edwyna Ready    There were no vitals filed for this visit.  Visit Diagnosis:  Weakness of wrist  Right wrist pain      Subjective Assessment - 05/22/15 1316    Subjective Pt reports all over improvement with Right wrist, but certain movement cause pain and she needs to wear wrist brace for support.    Patient Stated Goals Patient wants to use right wrist.    Currently in Pain? Yes   Pain Score 4    Pain Location Wrist   Pain Orientation Right   Pain Descriptors / Indicators Sharp;Shooting   Pain Type Acute pain   Pain Onset More than a month ago   Pain Frequency Intermittent   Aggravating Factors  open doors, picking up dog, reaching for seatbelt   Pain Relieving Factors wear brace   Multiple Pain Sites No                         [facility] Adult PT Treatment/Exercise - 05/22/15 0001    Exercises   Exercises Wrist   Wrist Exercises   Forearm Supination Strengthening;Both;20 reps;Seated;Bar weights/barbell   Theraband Level (Supination) --  with 2#   Forearm Pronation AROM;10 reps;Right    Wrist Flexion Strengthening;Right;10 reps   Wrist Extension Strengthening;Right;10 reps;Bar weights/barbell  initially pain with movement, but then able to perform   Bar Weights/Barbell (Wrist Extension) 1 lb   Wrist Ulnar Deviation Limitations no weight with v/c to prevent extension 2 x10   Other wrist exercises resistance to right thumb with fan out movement 10x3   Modalities   Modalities Ultrasound   Ultrasound   Ultrasound Location right wrist radial side   Ultrasound Parameters 50%, 3Mhz, 0.8w/cm x 57min   Ultrasound Goals Pain   Manual Therapy   Manual Therapy Soft tissue mobilization   Joint Mobilization right wrist for anterior and posterior glide, carpal mobilization    Soft tissue mobilization right wrist and right thumb extensor tendon   Passive ROM right wrist for all motions in painfree range   Kinesiotex --  as of now discontinued, seems not to help                  PT Short Term Goals - 05/20/15 1044    PT SHORT TERM GOAL #1   Title pain with activity decreased >/= 25%   Time 4   Period Weeks   Status Achieved   PT SHORT TERM GOAL #2  Title able to take spling of for 3 hours due to reduced pain   Time 4   Period Weeks   Status Achieved   PT SHORT TERM GOAL #3   Title eat and prepare food with pain decreased >/= 25%.   Time 4   Period Weeks   Status Achieved   PT SHORT TERM GOAL #4   Title perform work tasks with >/= 25% decreased pain   Time 4   Period Weeks   Status On-going  lifting 15% better           PT Long Term Goals - 05/06/15 1244    PT LONG TERM GOAL #1   Title able to work with pain decreased >/= 75%   Time 8   Period Weeks   Status On-going   PT LONG TERM GOAL #2   Title grip strength >/= 50 pounds so she can perform hand activities without difficulty   Time 8   Period Weeks   Status On-going   PT LONG TERM GOAL #3   Title right wrist strength 5/5 so she can perform her daily tasks with greater ease   Time 8    Period Weeks   Status On-going   PT LONG TERM GOAL #4   Title able to take her splint off for all activities with the exception of heavy lifting.    Time 8   Period Weeks   Status On-going               Plan - 05/22/15 1318    Clinical Impression Statement Pt had initially pain with wrist extension with 1 # weight, but that resolved after activities. Patient will continue to benefit from PT to guide her in strengthening and monitor with pain.   Pt will benefit from skilled therapeutic intervention in order to improve on the following deficits Decreased range of motion;Pain;Decreased endurance;Decreased activity tolerance;Impaired flexibility;Decreased strength;Decreased mobility   Rehab Potential Excellent   PT Frequency 2x / week   PT Duration 8 weeks   PT Treatment/Interventions Cryotherapy;Electrical Stimulation;Ultrasound;Moist Heat;Therapeutic activities;Therapeutic exercise;Neuromuscular re-education;Manual techniques;Patient/family education;Passive range of motion;Taping   PT Next Visit Plan ultrasound with biofreeze, soft tissue work, strengthening   PT Home Exercise Plan progress as needed   Consulted and Agree with Plan of Care Patient        Problem List Patient Active Problem List   Diagnosis Date Noted  . De Quervain's tenosynovitis, right 04/10/2015  . Palpitations 11/13/2014  . PAF (paroxysmal atrial fibrillation) 11/13/2014  . Pain in joint, shoulder region 02/22/2014  . Aortic insufficiency 01/29/2014  . Atrial fibrillation 01/09/2014  . Atrial fibrillation with RVR 01/09/2014  . Chest pain 01/09/2014  . A-fib 01/09/2014  . OTHER AND UNSPECIFIED OVARIAN CYST 09/08/2010  . GERD 08/10/2010  . Anxiety state 02/02/2010    NAUMANN-HOUEGNIFIO,Lucca Greggs PTA 05/22/2015, 1:21 PM  [facility] [address] [place], [place], [address] Phone: [phone]   Fax:  [phone]

## 2015-05-23 ENCOUNTER — Ambulatory Visit: Payer: Self-pay | Admitting: Podiatry

## 2015-05-26 ENCOUNTER — Encounter: Payer: Self-pay | Admitting: Physical Therapy

## 2015-05-26 ENCOUNTER — Ambulatory Visit: Payer: No Typology Code available for payment source | Attending: Sports Medicine | Admitting: Physical Therapy

## 2015-05-26 DIAGNOSIS — M25531 Pain in right wrist: Secondary | ICD-10-CM | POA: Insufficient documentation

## 2015-05-26 DIAGNOSIS — R29898 Other symptoms and signs involving the musculoskeletal system: Secondary | ICD-10-CM | POA: Insufficient documentation

## 2015-05-26 NOTE — Therapy (Addendum)
[facility] [address] [place], [place], [address] Phone: [phone]   Fax:  [phone]  Physical Therapy Treatment  Patient Details  Name: [name] MRN: [id number] Date of Birth: 07/23/1961 Referring Provider:  Advani, Deepak, MD  Encounter Date: 05/26/2015      PT End of Session - 05/26/15 1321    Visit Number 7   Date for PT Re-Evaluation 06/16/15   PT Start Time 1236   PT Stop Time 1315   PT Time Calculation (min) 39 min   Activity Tolerance Patient tolerated treatment well   Behavior During Therapy WFL for tasks assessed/performed      Past Medical History  Diagnosis Date  . Allergy   . Anemia   . GERD (gastroesophageal reflux disease)   . Migraines   . Shingles   . Atrial fibrillation     Past Surgical History  Procedure Laterality Date  . Ovarian cyst removal  1995    dr tavon    There were no vitals filed for this visit.  Visit Diagnosis:  Weakness of wrist  Right wrist pain      Subjective Assessment - 05/26/15 1241    Subjective Pt rates her improvement as 50%, since start of PT, she still needs to wear wrist brace for support with daily activities   Patient Stated Goals Patient wants to use right wrist.    Currently in Pain? Yes   Pain Score 3   only with certain motions   Pain Location Wrist   Pain Orientation Right   Pain Descriptors / Indicators Sharp;Shooting   Pain Type Acute pain   Pain Onset More than a month ago   Pain Frequency Intermittent   Aggravating Factors  open doors, picking up dog, reacing for seatbelt   Pain Relieving Factors wear brace   Multiple Pain Sites No                         [facility] Adult PT Treatment/Exercise - 05/26/15 0001    Exercises   Exercises Wrist   Wrist Exercises   Forearm Supination Strengthening;Both;20 reps;Seated   Theraband Level (Supination) --  incr to 3#   Forearm Pronation AROM;Strengthening   3# added   Wrist Flexion Strengthening;Right;20 reps  1# unable to perform with 2#   Wrist Extension Strengthening;Right;20 reps  2#   Bar Weights/Barbell (Wrist Extension) 2 lbs   Wrist Ulnar Deviation Limitations no weight with v/c to prevent extension   Other wrist exercises resistance to right thumb with fan out movement 10x3  pt with improved strength today   Modalities   Modalities Ultrasound   Ultrasound   Ultrasound Location right wrist   Ultrasound Parameters 50%, 3Mhz, 0.8W/cm   Ultrasound Goals Pain   Manual Therapy   Manual Therapy Soft tissue mobilization   Joint Mobilization right wrist for anterior and posterior glide, carpal mobilization    Soft tissue mobilization right wrist and right thumb extensor tendon   Passive ROM right wrist for all motions in painfree range                  PT Short Term Goals - 05/26/15 1324    PT SHORT TERM GOAL #1   Title pain with activity decreased >/= 25%   Time 4   Period Weeks   Status Achieved   PT SHORT TERM GOAL #2   Title able to take spling of for 3   hours due to reduced pain   Time 4   Period Weeks   Status Achieved   PT SHORT TERM GOAL #3   Title eat and prepare food with pain decreased >/= 25%.   Time 4   Period Weeks   Status Achieved   PT SHORT TERM GOAL #4   Title perform work tasks with >/= 25% decreased pain   Time 4   Period Weeks   Status Achieved           PT Long Term Goals - 05/06/15 1244    PT LONG TERM GOAL #1   Title able to work with pain decreased >/= 75%   Time 8   Period Weeks   Status On-going   PT LONG TERM GOAL #2   Title grip strength >/= 50 pounds so she can perform hand activities without difficulty   Time 8   Period Weeks   Status On-going   PT LONG TERM GOAL #3   Title right wrist strength 5/5 so she can perform her daily tasks with greater ease   Time 8   Period Weeks   Status On-going   PT LONG TERM GOAL #4   Title able to take her splint off for all  activities with the exception of heavy lifting.    Time 8   Period Weeks   Status On-going               Plan - 05/26/15 1322    Clinical Impression Statement pt with improved tolerance and strength for wrist exercises, but still palpaple tenderness over left radial wrist area, and with combination of movements   Pt will benefit from skilled therapeutic intervention in order to improve on the following deficits Decreased range of motion;Pain;Decreased endurance;Decreased activity tolerance;Impaired flexibility;Decreased strength;Decreased mobility   Rehab Potential Excellent   PT Frequency 2x / week   PT Duration 8 weeks   PT Treatment/Interventions Cryotherapy;Electrical Stimulation;Ultrasound;Moist Heat;Therapeutic activities;Therapeutic exercise;Neuromuscular re-education;Manual techniques;Patient/family education;Passive range of motion;Taping   PT Next Visit Plan ultrasound with biofreeze, soft tissue work, strengthening with focus on pollicis abduction   PT Home Exercise Plan progress as needed   Consulted and Agree with Plan of Care Patient        Problem List Patient Active Problem List   Diagnosis Date Noted  . De Quervain's tenosynovitis, right 04/10/2015  . Palpitations 11/13/2014  . PAF (paroxysmal atrial fibrillation) 11/13/2014  . Pain in joint, shoulder region 02/22/2014  . Aortic insufficiency 01/29/2014  . Atrial fibrillation 01/09/2014  . Atrial fibrillation with RVR 01/09/2014  . Chest pain 01/09/2014  . A-fib 01/09/2014  . OTHER AND UNSPECIFIED OVARIAN CYST 09/08/2010  . GERD 08/10/2010  . Anxiety state 02/02/2010    NAUMANN-HOUEGNIFIO,Morry Veiga PTA 05/26/2015, 1:26 PM  [facility] [address], [place], [place], [address] Phone: [phone]   Fax:  [phone]     PHYSICAL THERAPY DISCHARGE SUMMARY  Visits from Start of Care: 7  Current functional level related to goals /  functional outcomes: See above.  Patient called on 06/03/2015 to cancel remaining appointments.  Patient reports her wrist is feeling better and wants to be discharged.   Remaining deficits: See above   Education / Equipment: HEP  Plan: Patient agrees to discharge.  Patient goals were not met. Patient is being discharged due to being pleased with the current functional level.  Thank you for the referral. Earlie Counts, PT 06/03/2015 1:12 PM  ?????

## 2015-05-28 ENCOUNTER — Ambulatory Visit: Payer: No Typology Code available for payment source | Admitting: Physical Therapy

## 2015-06-03 ENCOUNTER — Ambulatory Visit: Payer: No Typology Code available for payment source | Admitting: Physical Therapy

## 2015-06-04 ENCOUNTER — Ambulatory Visit: Payer: Self-pay | Attending: Family Medicine

## 2015-06-05 ENCOUNTER — Encounter: Payer: Self-pay | Admitting: Physical Therapy

## 2015-06-09 ENCOUNTER — Encounter: Payer: Self-pay | Admitting: Physical Therapy

## 2015-06-11 ENCOUNTER — Encounter: Payer: Self-pay | Admitting: Physical Therapy

## 2015-06-23 ENCOUNTER — Telehealth: Payer: Self-pay | Admitting: Internal Medicine

## 2015-06-23 NOTE — Telephone Encounter (Signed)
Pt called in stating that within the past few days she has not been able to take her BP meds and Metoprolol due to a low pulse rate in the low 50's  and low BP. Please call and f/u with her  Thanks

## 2015-06-23 NOTE — Telephone Encounter (Signed)
Returned call to patient.She stated for the last couple of days she has held metoprolol 12.5 mg.Stated her B/P has been low ranging 89/60 to 94/70.Pulse 60 to 70.Stated last night she had increased anxiety B/P 134/85 pulse 53 to 60.Stated she is going through menopause and don't think she has been drinking enough water.Stated she use to see Dr.Grove and he prescribed xanax for anxiety with her mvp.Stated her PCP will not prescribe.She feels like xanax would help.B/P at present 122/77 pulse 77.Advised ok to take metoprolol 12.5 mg twice a day.Advised ok to take if systolic B/P over 051 and pulse over 50.Advised to increase water intake.Dr.Hilty out of office this week.I will send message to Dr.Hilty for advice.Dr.Hilty does not prescribe xanax.I will ask him for a prescription.She also wants to ask him if she can take [employer].

## 2015-06-26 ENCOUNTER — Ambulatory Visit: Payer: Self-pay | Admitting: Family Medicine

## 2015-06-27 ENCOUNTER — Other Ambulatory Visit: Payer: Self-pay | Admitting: *Deleted

## 2015-06-27 DIAGNOSIS — M654 Radial styloid tenosynovitis [de Quervain]: Secondary | ICD-10-CM

## 2015-07-01 NOTE — Telephone Encounter (Signed)
Returned call to patient no answer.LMTC. 

## 2015-07-01 NOTE — Telephone Encounter (Signed)
Sounds like good advice with the metoprolol given the HR and BP parameters. I do not prescribe xanax, you are correct. No concerns about her taking [name] for perimenopausal symptoms.  Dr. Lemmie Evens

## 2015-07-02 NOTE — Telephone Encounter (Signed)
Pt is returning the nurse's call from yesterday.

## 2015-07-02 NOTE — Telephone Encounter (Signed)
Returned call to patient and reiterated [name]'s advice regarding parameters for BP and HR w/metoprolol. Informed her Dr. Debara Barrett does not Rx xanax and it is OK to take [place]. She voiced understanding. She does say she gets anxiety when her HR drops and used to take xanax as needed.

## 2015-07-15 ENCOUNTER — Ambulatory Visit: Payer: Self-pay | Attending: Family Medicine | Admitting: Family Medicine

## 2015-07-15 ENCOUNTER — Encounter: Payer: Self-pay | Admitting: Family Medicine

## 2015-07-15 VITALS — BP 103/69 | HR 77 | Temp 98.7°F | Resp 16 | Ht 68.5 in | Wt 181.0 lb

## 2015-07-15 DIAGNOSIS — K0889 Other specified disorders of teeth and supporting structures: Secondary | ICD-10-CM | POA: Insufficient documentation

## 2015-07-15 DIAGNOSIS — K088 Other specified disorders of teeth and supporting structures: Secondary | ICD-10-CM | POA: Insufficient documentation

## 2015-07-15 DIAGNOSIS — Z79899 Other long term (current) drug therapy: Secondary | ICD-10-CM | POA: Insufficient documentation

## 2015-07-15 DIAGNOSIS — Z7982 Long term (current) use of aspirin: Secondary | ICD-10-CM | POA: Insufficient documentation

## 2015-07-15 NOTE — Patient Instructions (Signed)
Ms. Buckingham,  Ellese was seen today for referral.  Diagnoses and all orders for this visit:  Pain, dental -     Ambulatory referral to Dentistry    F/u with me for health maintenance physical in 3-4 weeks   Dr. Adrian Blackwater

## 2015-07-15 NOTE — Progress Notes (Signed)
   Subjective:  Patient ID: [name], female    DOB: 06-Feb-1961  Age: 54 y.o. MRN: [id number]  CC: Referral   HPI JONISHA KINDIG presents for dental referral. Does not have a dental home. Has cold sensitivity. Has some missing crowns.   Outpatient Prescriptions Prior to Visit  Medication Sig Dispense Refill  . ascorbic acid (VITAMIN C) 500 MG tablet Take 500 mg by mouth daily.     Marland Kitchen aspirin 81 MG chewable tablet Chew 162 mg by mouth at bedtime.     Marland Kitchen b complex vitamins tablet Take 1 tablet by mouth daily.    Marland Kitchen co-enzyme Q-10 50 MG capsule Take 50 mg by mouth daily.     . metoprolol succinate (TOPROL-XL) 25 MG 24 hr tablet Take 12.5 mg by mouth 2 (two) times daily.    . Probiotic Product (PROBIOTIC FORMULA PO) Take 1 tablet by mouth daily.     Marland Kitchen OVER THE COUNTER MEDICATION Take 5 mLs by mouth daily. Omega 3 (with DHA) liquid     No facility-administered medications prior to visit.   Social History  Substance Use Topics  . Smoking status: Never Smoker   . Smokeless tobacco: Never Used  . Alcohol Use: No   ROS Review of Systems  Constitutional: Negative for fever and chills.  HENT: Positive for dental problem. Negative for mouth sores.   Respiratory: Negative for shortness of breath.   Cardiovascular: Negative for chest pain and palpitations.    Objective:  BP 103/69 mmHg  Pulse 77  Temp(Src) 98.7 F (37.1 C) (Oral)  Resp 16  Ht 5' 8.5" (1.74 m)  Wt 181 lb (82.101 kg)  BMI 27.12 kg/m2  SpO2 97%  BP/Weight 07/15/2015 05/21/2015 9/39/0300  Systolic BP 923 300 762  Diastolic BP 69 70 72  Wt. (Lbs) 181 - 185.6  BMI 27.12 - 28.23    Physical Exam  Constitutional: She is oriented to person, place, and time. She appears well-developed and well-nourished. No distress.  HENT:  Head: Normocephalic and atraumatic.  Mouth/Throat: Oropharynx is clear and moist and mucous membranes are normal. No oral lesions. Abnormal dentition. No dental abscesses.  Multiple  fillings Missing crowns Tooth erosion   Cardiovascular: Normal rate, regular rhythm, normal heart sounds and intact distal pulses.   Pulmonary/Chest: Effort normal and breath sounds normal.  Musculoskeletal: She exhibits no edema.  Neurological: She is alert and oriented to person, place, and time.  Skin: Skin is warm and dry. No rash noted.  Psychiatric: She has a normal mood and affect.     Assessment & Plan:   Problem List Items Addressed This Visit    Pain, dental - Primary   Relevant Orders   Ambulatory referral to Dentistry      No orders of the defined types were placed in this encounter.    Follow-up: No Follow-up on file.   Boykin Nearing MD

## 2015-07-16 ENCOUNTER — Ambulatory Visit: Payer: Self-pay

## 2015-07-22 ENCOUNTER — Telehealth: Payer: Self-pay | Admitting: Internal Medicine

## 2015-07-22 NOTE — Telephone Encounter (Signed)
Kaytelynn is calling because she is going to the dentist today and want to know if there are any precautions she should take if dental work is needed. Please call

## 2015-07-22 NOTE — Telephone Encounter (Signed)
Patient going for dental consultation today and wonders if there is any precautions she needs to take such as needing any medication prophylactics.     Told her I don't see anything on her chart that would be something that she would need to be precarious with.  Patient concerned of the feelings she gets with too much Novocain.    Told patient to make sure she lets her dentist know of any issues she has with Novocain.

## 2015-07-23 ENCOUNTER — Ambulatory Visit: Payer: Self-pay | Attending: Sports Medicine

## 2015-07-23 DIAGNOSIS — M25531 Pain in right wrist: Secondary | ICD-10-CM | POA: Insufficient documentation

## 2015-07-23 DIAGNOSIS — R29898 Other symptoms and signs involving the musculoskeletal system: Secondary | ICD-10-CM | POA: Insufficient documentation

## 2015-07-23 NOTE — Therapy (Signed)
[facility] [address], [place], [place], [address] Phone: [phone]   Fax:  [phone]  Physical Therapy Evaluation  Patient Details  Name: [name] MRN: [id number] Date of Birth: 14-Sep-1961 Referring Provider:  Thurman Coyer, DO  Encounter Date: 07/23/2015      PT End of Session - 07/23/15 1305    Visit Number 1   Date for PT Re-Evaluation 09/03/15   PT Start Time 1230   PT Stop Time 1305   PT Time Calculation (min) 35 min   Activity Tolerance Patient tolerated treatment well   Behavior During Therapy [facility] for tasks assessed/performed      Past Medical History  Diagnosis Date  . Allergy   . Anemia   . GERD (gastroesophageal reflux disease)   . Migraines   . Shingles   . Atrial fibrillation     Past Surgical History  Procedure Laterality Date  . Ovarian cyst removal  1995    dr Edwyna Ready    There were no vitals filed for this visit.  Visit Diagnosis:  Weakness of wrist - Plan: PT plan of care cert/re-cert  Right wrist pain - Plan: PT plan of care cert/re-cert      Subjective Assessment - 07/23/15 1236    Subjective Pt is a Rt hand dominant female who presents to PT with Rt wrist pain and inflammation.  Pt had injury while lifting a heavy object with audible pop followed by pain.  Pt is no longer wearing brace. Pt had treatment at this clinic and was doing well and feels that she stopped PT too early.     Diagnostic tests x-ray: negative   Patient Stated Goals Patient wants to use right wrist, reduce pain   Currently in Pain? Yes   Pain Score 6   with aggravating motions   Pain Location Wrist   Pain Orientation Right   Pain Descriptors / Indicators Shooting;Sharp   Pain Type Acute pain   Pain Onset More than a month ago   Pain Frequency Intermittent   Aggravating Factors  turning on the car, opening jars, lifting heavy object   Pain Relieving Factors not performing the  aggravating activity, ice   Multiple Pain Sites No            [facility] PT Assessment - 07/23/15 0001    Assessment   Medical Diagnosis M65.4 DeQuervains tenosynovitis, right, M25.531 right wrist pain   Onset Date/Surgical Date 03/10/15   Hand Dominance Right   Prior Therapy last month   Balance Screen   Has the patient fallen in the past 6 months No   Has the patient had a decrease in activity level because of a fear of falling?  No   Is the patient reluctant to leave their home because of a fear of falling?  No   Prior Function   Level of Independence Independent with basic ADLs   Vocation Part time employment   Vocation Requirements lifting, transport patient to wheelchair   Observation/Other Assessments   Focus on Therapeutic Outcomes (FOTO)  37% limitation   AROM   Overall AROM  Deficits   AROM Assessment Site Wrist  Full Rt wrist AROM except radial deviation limited by 25% wi   Right/Left Wrist Right   PROM   PROM Assessment Site Wrist   Right/Left Wrist Right   Strength   Overall Strength Comments thumb extension 4-/5 with pain   Strength Assessment Site Wrist   Right/Left  Wrist Right   Right Wrist Flexion 4/5   Right Wrist Extension 4+/5   Right Wrist Radial Deviation 4-/5   Right Wrist Ulnar Deviation 5/5   Right/Left hand Right   Right Hand Grip (lbs) 67#   Palpation   Palpation comment pinpoint tenderness located in right lateral radius distally                           PT Education - 07/23/15 1302    Education provided Yes   Education Details thumb and wrist stretch   Person(s) Educated Patient   Methods Explanation;Demonstration;Handout   Comprehension Verbalized understanding;Returned demonstration          PT Short Term Goals - 07/23/15 1310    PT SHORT TERM GOAL #1   Title be independent in initial HEP   Time 3   Period Weeks   Status New   PT SHORT TERM GOAL #2   Title report 30% less Rt wrist pain with lifting heavy  objects   Time 3   Period Weeks   Status New   PT SHORT TERM GOAL #3   Title --   PT SHORT TERM GOAL #4   Title --           PT Long Term Goals - 07/23/15 1229    PT LONG TERM GOAL #1   Title be independent in advanced HEP   Time 6   Period Weeks   Status New   PT LONG TERM GOAL #2   Title reduce FOTO to < or = to 32% limitation   Time 6   Period Weeks   Status New   PT LONG TERM GOAL #3   Title demonstrate 4+/5 Rt wrist strength to improve use of wrist with lifting    Time 6   Period Weeks   Status New   PT LONG TERM GOAL #4   Title report > or = to 90% use of Rt UE with ADLs and work tasks   Time 6   Period Weeks   Status New   PT LONG TERM GOAL #5   Title report 60% reduction in Rt wrist pain with lifting heavy objects and turning at the wrist   Time 6   Period Weeks   Status New               Plan - 07/23/15 1305    Clinical Impression Statement Pt is a Rt hand dominant female who presents with Rt wrist and thumb pain that began 02/2015 after lifting a heavy object and hearing a pop.  Pt wore a brace and was feeling better with PT at this clinic and requested D/C to HEP.  Pt reports that she has had continued pain at the base of the thumb and is limited in use of the Rt UE with turning motions and lifting.  Pt with limited wrist AROM into radial deviation, weakness at the wrist and thumb and pain with turning the wrist.  FOTO score is 27% limitation.  Pt will benefit from skilled PT for pain management, flexiblity for thumb and wrist and strength progression to return to regular function.     Pt will benefit from skilled therapeutic intervention in order to improve on the following deficits Decreased range of motion;Pain;Decreased endurance;Decreased activity tolerance;Impaired flexibility;Decreased strength;Decreased mobility   Rehab Potential Excellent   PT Frequency 2x / week   PT Duration 6 weeks   PT Treatment/Interventions  Cryotherapy;Electrical  Stimulation;Ultrasound;Moist Heat;Therapeutic activities;Therapeutic exercise;Neuromuscular re-education;Manual techniques;Patient/family education;Passive range of motion;Taping;Iontophoresis 4mg /ml Dexamethasone   PT Next Visit Plan See if pt wants to do ionto (she will check with cardiac MD), thumb and wrist AROM, Ultrasound   Consulted and Agree with Plan of Care Patient         Problem List Patient Active Problem List   Diagnosis Date Noted  . Pain, dental 07/15/2015  . De Quervain's tenosynovitis, right 04/10/2015  . Pain in joint, shoulder region 02/22/2014  . Aortic insufficiency 01/29/2014  . Atrial fibrillation 01/09/2014  . Chest pain 01/09/2014  . OTHER AND UNSPECIFIED OVARIAN CYST 09/08/2010  . GERD 08/10/2010  . Anxiety state 02/02/2010    TAKACS,KELLY, PT 07/23/2015, 1:14 PM  [facility] [address] [place] , [place], [address] Phone: [phone]   Fax:  [phone]

## 2015-07-23 NOTE — Patient Instructions (Signed)
Wrist Extensor Stretch   Keeping elbow straight, grasp left hand and slowly bend wrist forward until stretch is felt. Hold _10-20___ seconds. Relax. Repeat _3___ times per set. Do _1___ sets per session. Do __3__ sessions per day.  Copyright  VHI. All rights reserved.  PROM: Wrist Radial / Ulnar Deviation   Grasp right hand with other hand and gently stretch hand and wrist from side to side as far as possible. Hold each position __10-20__ seconds. Relax. Repeat _3___ times per set. Do _1___ sets per session. Do __3__ sessions per day.  Copyright  VHI. All rights reserved.  PROM: Thumb MCP Joint   Passively bend right thumb at knuckle as shown until stretch is felt. Hold _10-20___ seconds. Relax. Then straighten thumb as far as possible. Repeat _3___ times per set. Do __1__ sets per session. Do __3__ sessions per day.  [facility] [address] [place], [place] [address] Phone # [phone] Fax [phone]

## 2015-07-25 ENCOUNTER — Encounter: Payer: Self-pay | Admitting: Physical Therapy

## 2015-07-25 ENCOUNTER — Ambulatory Visit: Payer: Self-pay | Admitting: Physical Therapy

## 2015-07-25 DIAGNOSIS — M25531 Pain in right wrist: Secondary | ICD-10-CM

## 2015-07-25 NOTE — Therapy (Signed)
[facility] [address] [place], [place], [address] Phone: [phone]   Fax:  [phone]  Physical Therapy Treatment  Patient Details  Name: [name] MRN: [id number] Date of Birth: 09/21/1961 Referring Provider:  Thurman Coyer, DO  Encounter Date: 07/25/2015      PT End of Session - 07/25/15 1136    Visit Number 2   Date for PT Re-Evaluation 09/03/15   PT Start Time 1107   PT Stop Time 1138   PT Time Calculation (min) 31 min   Activity Tolerance Patient tolerated treatment well   Behavior During Therapy [facility] for tasks assessed/performed      Past Medical History  Diagnosis Date  . Allergy   . Anemia   . GERD (gastroesophageal reflux disease)   . Migraines   . Shingles   . Atrial fibrillation     Past Surgical History  Procedure Laterality Date  . Ovarian cyst removal  1995    dr Edwyna Ready    There were no vitals filed for this visit.  Visit Diagnosis:  Right wrist pain      Subjective Assessment - 07/25/15 1109    Subjective MD states Ionto patch ok to do. Home exercises are helping.    Currently in Pain? Yes   Pain Score 4    Pain Location Wrist   Pain Orientation Right   Pain Descriptors / Indicators Aching   Aggravating Factors  deviating wrist lateral   Multiple Pain Sites No                         [facility] Adult PT Treatment/Exercise - 07/25/15 0001    Wrist Exercises   Wrist Ulnar Deviation Limitations Extension stretch 3 x20    Other wrist exercises Ulnar deviation stretch 3 x20 sec   Ultrasound   Ultrasound Location right wrist   Ultrasound Parameters 50% 3 MZ 1wtcms   Ultrasound Goals Pain   Iontophoresis   Type of Iontophoresis Dexamethasone  #1   Location RT wrist   Dose 1 ml   Time 6 hr wear skin intact                PT Education - 07/25/15 1126    Education provided Yes   Education Details ionto precautions and how to perform  ice massage at home   Person(s) Educated Patient   Methods Explanation;Handout   Comprehension Verbalized understanding          PT Short Term Goals - 07/25/15 1139    PT SHORT TERM GOAL #1   Title be independent in initial HEP   Time 3   Period Weeks   Status Achieved           PT Long Term Goals - 07/23/15 1229    PT LONG TERM GOAL #1   Title be independent in advanced HEP   Time 6   Period Weeks   Status New   PT LONG TERM GOAL #2   Title reduce FOTO to < or = to 32% limitation   Time 6   Period Weeks   Status New   PT LONG TERM GOAL #3   Title demonstrate 4+/5 Rt wrist strength to improve use of wrist with lifting    Time 6   Period Weeks   Status New   PT LONG TERM GOAL #4   Title report > or = to 90% use of Rt UE  with ADLs and work tasks   Time 6   Period Weeks   Status New   PT LONG TERM GOAL #5   Title report 60% reduction in Rt wrist pain with lifting heavy objects and turning at the wrist   Time 6   Period Weeks   Status New               Plan - 07/25/15 1137    Clinical Impression Statement Only second visit and pt reports the pain is already significantly down doing her home stretches. Ionto began today along with ultrasound. Pt will resume ice massage at home as she is able to increase her activity.   Pt will benefit from skilled therapeutic intervention in order to improve on the following deficits Decreased range of motion;Pain;Decreased endurance;Decreased activity tolerance;Impaired flexibility;Decreased strength;Decreased mobility   Rehab Potential Excellent   Clinical Impairments Affecting Rehab Potential None   PT Frequency 2x / week   PT Duration 6 weeks   PT Treatment/Interventions Cryotherapy;Electrical Stimulation;Ultrasound;Moist Heat;Therapeutic activities;Therapeutic exercise;Neuromuscular re-education;Manual techniques;Patient/family education;Passive range of motion;Taping;Iontophoresis 4mg /ml Dexamethasone   PT Next Visit  Plan Ionto, [place], maybe wrist isomterics for home?   Consulted and Agree with Plan of Care Patient        Problem List Patient Active Problem List   Diagnosis Date Noted  . Pain, dental 07/15/2015  . De Quervain's tenosynovitis, right 04/10/2015  . Pain in joint, shoulder region 02/22/2014  . Aortic insufficiency 01/29/2014  . Atrial fibrillation 01/09/2014  . Chest pain 01/09/2014  . OTHER AND UNSPECIFIED OVARIAN CYST 09/08/2010  . GERD 08/10/2010  . Anxiety state 02/02/2010    COCHRAN,JENNIFER, PTA 07/25/2015, 11:41 AM  [facility] [address] [place], [place], [address] Phone: [phone]   Fax:  [phone]

## 2015-07-25 NOTE — Patient Instructions (Signed)

## 2015-07-29 ENCOUNTER — Ambulatory Visit: Payer: Self-pay | Attending: Sports Medicine | Admitting: Physical Therapy

## 2015-07-29 ENCOUNTER — Encounter: Payer: Self-pay | Admitting: Physical Therapy

## 2015-07-29 DIAGNOSIS — M25531 Pain in right wrist: Secondary | ICD-10-CM | POA: Insufficient documentation

## 2015-07-29 DIAGNOSIS — R29898 Other symptoms and signs involving the musculoskeletal system: Secondary | ICD-10-CM | POA: Insufficient documentation

## 2015-07-29 NOTE — Therapy (Signed)
[facility] [address] [name], [place], [address] Phone: [phone]   Fax:  [phone]  Physical Therapy Treatment  Patient Details  Name: [name] MRN: [id number] Date of Birth: 05/21/1961 Referring Provider:  Thurman Coyer, DO  Encounter Date: 07/29/2015      PT End of Session - 07/29/15 1342    Visit Number 3   Date for PT Re-Evaluation 09/03/15   PT Start Time 1104   PT Stop Time 1142   PT Time Calculation (min) 38 min   Activity Tolerance Patient tolerated treatment well   Behavior During Therapy [facility] for tasks assessed/performed      Past Medical History  Diagnosis Date  . Allergy   . Anemia   . GERD (gastroesophageal reflux disease)   . Migraines   . Shingles   . Atrial fibrillation [facility])     Past Surgical History  Procedure Laterality Date  . Ovarian cyst removal  1995    dr Edwyna Ready    There were no vitals filed for this visit.  Visit Diagnosis:  Right wrist pain  Weakness of wrist      Subjective Assessment - 07/29/15 1340    Subjective Pt reports ionto patched seems to help. Pt reports with incr activity level foe ADL's increase of pain in right wrist radial side   Currently in Pain? Yes   Pain Score 4    Pain Location Wrist   Pain Orientation Right   Pain Descriptors / Indicators Aching   Pain Type Acute pain   Pain Onset More than a month ago   Multiple Pain Sites No                         [facility] Adult PT Treatment/Exercise - 07/29/15 0001    Wrist Exercises   Wrist Ulnar Deviation Limitations Extension stretch 3 x 20sec   Other wrist exercises Ulnar deviation stretch 3 x20 sec   Ultrasound   Ultrasound Location right wrist   Ultrasound Parameters 50%, 3Mhz, 0.8 W/cm   Ultrasound Goals Pain   Iontophoresis   Type of Iontophoresis Dexamethasone   Location RT wrist   Dose 1 ml   Time 6 hour wear time, placed on intact skin    Manual  Therapy   Manual Therapy Soft tissue mobilization  including forearm muscles to release tension   Joint Mobilization right wrist for anterior and posterior glide, carpal mobilization    Soft tissue mobilization right wrist and right thumb extensor tendon   Passive ROM right wrist for all motions in painfree range                  PT Short Term Goals - 07/29/15 1346    PT SHORT TERM GOAL #1   Title be independent in initial HEP   Time 3   Period Weeks   Status Achieved   PT SHORT TERM GOAL #2   Title report 30% less Rt wrist pain with lifting heavy objects   Time 3   Period Weeks   Status On-going           PT Long Term Goals - 07/23/15 1229    PT LONG TERM GOAL #1   Title be independent in advanced HEP   Time 6   Period Weeks   Status New   PT LONG TERM GOAL #2   Title reduce FOTO to < or = to 32% limitation  Time 6   Period Weeks   Status New   PT LONG TERM GOAL #3   Title demonstrate 4+/5 Rt wrist strength to improve use of wrist with lifting    Time 6   Period Weeks   Status New   PT LONG TERM GOAL #4   Title report > or = to 90% use of Rt UE with ADLs and work tasks   Time 6   Period Weeks   Status New   PT LONG TERM GOAL #5   Title report 60% reduction in Rt wrist pain with lifting heavy objects and turning at the wrist   Time 6   Period Weeks   Status New               Plan - 07/29/15 1343    Clinical Impression Statement Pt reports with increased use of right wrist for ADL's pain increases. Pt reports ionto patch seems to help. Palpaple tenderness on distal radius and tight forearm muscles. Pt will continue to benefit from skilled PT   Pt will benefit from skilled therapeutic intervention in order to improve on the following deficits Decreased range of motion;Pain;Decreased endurance;Decreased activity tolerance;Impaired flexibility;Decreased strength;Decreased mobility   Rehab Potential Excellent   PT Frequency 2x / week   PT  Duration 6 weeks   PT Treatment/Interventions Cryotherapy;Electrical Stimulation;Ultrasound;Moist Heat;Therapeutic activities;Therapeutic exercise;Neuromuscular re-education;Manual techniques;Patient/family education;Passive range of motion;Taping;Iontophoresis 4mg /ml Dexamethasone   PT Next Visit Plan Ionto, [place], softtissue work   PT Home Exercise Plan progress as needed   Consulted and Agree with Plan of Care Patient        Problem List Patient Active Problem List   Diagnosis Date Noted  . Pain, dental 07/15/2015  . De Quervain's tenosynovitis, right 04/10/2015  . Pain in joint, shoulder region 02/22/2014  . Aortic insufficiency 01/29/2014  . Atrial fibrillation ([place]) 01/09/2014  . Chest pain 01/09/2014  . OTHER AND UNSPECIFIED OVARIAN CYST 09/08/2010  . GERD 08/10/2010  . Anxiety state 02/02/2010    NAUMANN-HOUEGNIFIO,Katlyn Muldrew PTA 07/29/2015, 1:52 PM  [facility] [address] [place], [place], [address] Phone: [phone]   Fax:  [phone]

## 2015-07-30 ENCOUNTER — Encounter: Payer: Self-pay | Admitting: Physical Therapy

## 2015-08-06 ENCOUNTER — Ambulatory Visit: Payer: Self-pay | Admitting: Physical Therapy

## 2015-08-06 ENCOUNTER — Encounter: Payer: Self-pay | Admitting: Physical Therapy

## 2015-08-06 DIAGNOSIS — M25531 Pain in right wrist: Secondary | ICD-10-CM

## 2015-08-06 NOTE — Therapy (Signed)
[facility] [address] [place], [place], [address] Phone: [phone]   Fax:  [phone]  Physical Therapy Treatment  Patient Details  Name: [name] MRN: [id number] Date of Birth: 1961/09/14 Referring Provider:  Thurman Coyer, DO  Encounter Date: 08/06/2015      PT End of Session - 08/06/15 1309    Visit Number 4   Date for PT Re-Evaluation 09/03/15   PT Start Time 1236   PT Stop Time 1305   PT Time Calculation (min) 29 min   Activity Tolerance Patient tolerated treatment well   Behavior During Therapy [facility] for tasks assessed/performed      Past Medical History  Diagnosis Date  . Allergy   . Anemia   . GERD (gastroesophageal reflux disease)   . Migraines   . Shingles   . Atrial fibrillation [facility]     Past Surgical History  Procedure Laterality Date  . Ovarian cyst removal  1995    dr Edwyna Ready    There were no vitals filed for this visit.  Visit Diagnosis:  Right wrist pain      Subjective Assessment - 08/06/15 1237    Subjective 40% less pain. Ionto patches really helping.   Currently in Pain? Yes   Pain Score 2    Pain Location Wrist   Pain Orientation Right   Pain Descriptors / Indicators Dull   Aggravating Factors  deviating lateral but it is better   Pain Relieving Factors not doing above   Multiple Pain Sites No                         [facility] Adult PT Treatment/Exercise - 08/06/15 0001    Ultrasound   Ultrasound Location RT wist    Ultrasound Parameters 50% 3 MZ 1wtcms    Ultrasound Goals Pain   Iontophoresis   Type of Iontophoresis Dexamethasone  #3 skin intact   Location RT wrist   Dose 1 ml   Time 6 hour wear time, placed on intact skin    Manual Therapy   Manual Therapy Soft tissue mobilization  including forearm muscles to release tension                  PT Short Term Goals - 08/06/15 1259    PT SHORT TERM GOAL #2   Title  report 30% less Rt wrist pain with lifting heavy objects   Time 3   Period Weeks   Status Achieved  Pain 60%           PT Long Term Goals - 08/06/15 1259    PT LONG TERM GOAL #3   Time 6   Period Weeks   Status On-going   PT LONG TERM GOAL #4   Title report > or = to 90% use of Rt UE with ADLs and work tasks   Time 6   Status On-going  80% reported               Plan - 08/06/15 1309    Clinical Impression Statement Pt is 40% improved from last week, 80% improved overall and doing 80% of her ADLs normally. Modalities have decreased pain, she continues to do her stretching at home. Pt met her pain goals this week. Will continue to work on [employer] and do MMT next week to establish her current level of strength.     Pt will benefit from skilled therapeutic intervention  in order to improve on the following deficits Decreased range of motion;Pain;Decreased endurance;Decreased activity tolerance;Impaired flexibility;Decreased strength;Decreased mobility   Clinical Impairments Affecting Rehab Potential None   PT Frequency 2x / week   PT Duration 6 weeks   PT Treatment/Interventions Cryotherapy;Electrical Stimulation;Ultrasound;Moist Heat;Therapeutic activities;Therapeutic exercise;Neuromuscular re-education;Manual techniques;Patient/family education;Passive range of motion;Taping;Iontophoresis 4mg /ml Dexamethasone   PT Next Visit Plan ionto #4, MMT wrist, look at strength; address if needed or continue with [place] and soft tissue.    Consulted and Agree with Plan of Care Patient        Problem List Patient Active Problem List   Diagnosis Date Noted  . Pain, dental 07/15/2015  . De Quervain's tenosynovitis, right 04/10/2015  . Pain in joint, shoulder region 02/22/2014  . Aortic insufficiency 01/29/2014  . Atrial fibrillation ([place]) 01/09/2014  . Chest pain 01/09/2014  . OTHER AND UNSPECIFIED OVARIAN CYST 09/08/2010  . GERD 08/10/2010  . Anxiety state 02/02/2010     Reneshia Zuccaro, PTA 08/06/2015, 1:14 PM  [place] [facility] [address], [place], [place], [address] Phone: [phone]   Fax:  [phone]

## 2015-08-06 NOTE — Patient Instructions (Signed)
Verbally instructed pt to ice her elbow if it gets sore from soft tissue work. She verbally agreed/understood.

## 2015-08-12 ENCOUNTER — Encounter: Payer: Self-pay | Admitting: Physical Therapy

## 2015-08-12 ENCOUNTER — Ambulatory Visit: Payer: Self-pay | Admitting: Physical Therapy

## 2015-08-12 DIAGNOSIS — M25531 Pain in right wrist: Secondary | ICD-10-CM

## 2015-08-12 DIAGNOSIS — R29898 Other symptoms and signs involving the musculoskeletal system: Secondary | ICD-10-CM

## 2015-08-12 NOTE — Therapy (Signed)
[facility] [address] [place], [place], [address] Phone: [phone]   Fax:  [phone]  Physical Therapy Treatment  Patient Details  Name: [name] MRN: [id number] Date of Birth: 1961/09/27 No Data Recorded  Encounter Date: 08/12/2015      PT End of Session - 08/12/15 1245    Visit Number 5   Date for PT Re-Evaluation 09/03/15   PT Start Time 1146   PT Stop Time 1228   PT Time Calculation (min) 42 min   Activity Tolerance Patient tolerated treatment well   Behavior During Therapy [facility] for tasks assessed/performed      Past Medical History  Diagnosis Date  . Allergy   . Anemia   . GERD (gastroesophageal reflux disease)   . Migraines   . Shingles   . Atrial fibrillation [facility])     Past Surgical History  Procedure Laterality Date  . Ovarian cyst removal  1995    dr Edwyna Ready    There were no vitals filed for this visit.  Visit Diagnosis:  Right wrist pain  Weakness of wrist      Subjective Assessment - 08/12/15 1151    Subjective Pt reports ionto patches seem to help, the improvement is rated as 60%, now the pain is intermittend with certain movements   Currently in Pain? No/denies  with certain  movements up to 4/10                         [facility] Adult PT Treatment/Exercise - 08/12/15 0001    Exercises   Exercises Wrist   Wrist Exercises   Wrist Flexion AROM;Right;20 reps   Wrist Extension AROM;Right   Ultrasound   Ultrasound Location Rt wrist   Ultrasound Parameters 50%, 3Mhz, 0.8W/cm, x 3min   Ultrasound Goals Pain   Iontophoresis   Type of Iontophoresis Dexamethasone   Location #4 RT wrist   Dose 1 ml   Time 6 hour wear time, placed on intact skin    Manual Therapy   Manual Therapy Soft tissue mobilization  with forearm muscles focus on extensors to release tension                PT Education - 08/12/15 1244    Education provided Yes   Education Details Ionto precautions    Person(s) Educated Patient   Methods Explanation   Comprehension Verbalized understanding          PT Short Term Goals - 08/06/15 1259    PT SHORT TERM GOAL #2   Title report 30% less Rt wrist pain with lifting heavy objects   Time 3   Period Weeks   Status Achieved  Pain 60%           PT Long Term Goals - 08/12/15 1249    PT LONG TERM GOAL #1   Title be independent in advanced HEP   Time 6   Period Weeks   Status On-going   PT LONG TERM GOAL #2   Title reduce FOTO to < or = to 32% limitation   Time 6   Period Weeks   Status On-going   PT LONG TERM GOAL #3   Title demonstrate 4+/5 Rt wrist strength to improve use of wrist with lifting    Time 6   Period Weeks   Status On-going   PT LONG TERM GOAL #4   Title report > or = to 90% use of  Rt UE with ADLs and work tasks   Time 6   Period Weeks   Status On-going   PT LONG TERM GOAL #5   Title report 60% reduction in Rt wrist pain with lifting heavy objects and turning at the wrist   Time 6   Period Weeks   Status On-going               Plan - 08/12/15 1246    Clinical Impression Statement Pt with limited ROM in Rt wrist in all planes a nd feeling of stiffness. Modalities and Softtissue work are helping. Pt will continue to benefit from skilled PT to improve ROM and strength in right wrist   Pt will benefit from skilled therapeutic intervention in order to improve on the following deficits Decreased range of motion;Pain;Decreased endurance;Decreased activity tolerance;Impaired flexibility;Decreased strength;Decreased mobility   Rehab Potential Excellent   PT Frequency 2x / week   PT Duration 6 weeks   PT Treatment/Interventions Cryotherapy;Electrical Stimulation;Ultrasound;Moist Heat;Therapeutic activities;Therapeutic exercise;Neuromuscular re-education;Manual techniques;Patient/family education;Passive range of motion;Taping;Iontophoresis 4mg /ml Dexamethasone   PT Next  Visit Plan Ionto #5, MMT wrist, work on strength, [place] and softtissue   Consulted and Agree with Plan of Care Patient        Problem List Patient Active Problem List   Diagnosis Date Noted  . Pain, dental 07/15/2015  . De Quervain's tenosynovitis, right 04/10/2015  . Pain in joint, shoulder region 02/22/2014  . Aortic insufficiency 01/29/2014  . Atrial fibrillation ([place]) 01/09/2014  . Chest pain 01/09/2014  . OTHER AND UNSPECIFIED OVARIAN CYST 09/08/2010  . GERD 08/10/2010  . Anxiety state 02/02/2010    NAUMANN-HOUEGNIFIO,Ashely Joshua PTA 08/12/2015, 12:52 PM  [facility] [address] [place], [place], [address] Phone: [phone]   Fax:  [phone]  Name: [name] MRN: [id number] Date of Birth: 07/24/1961

## 2015-08-14 ENCOUNTER — Encounter: Payer: Self-pay | Admitting: Physical Therapy

## 2015-08-14 ENCOUNTER — Ambulatory Visit: Payer: Self-pay | Admitting: Physical Therapy

## 2015-08-14 DIAGNOSIS — M25531 Pain in right wrist: Secondary | ICD-10-CM

## 2015-08-14 DIAGNOSIS — R29898 Other symptoms and signs involving the musculoskeletal system: Secondary | ICD-10-CM

## 2015-08-14 NOTE — Therapy (Addendum)
[facility] [address] [place], [place], [address] Phone: [phone]   Fax:  [phone]  Physical Therapy Treatment  Patient Details  Name: [name] MRN: [id number] Date of Birth: [birth year] No Data Recorded  Encounter Date: 08/14/2015  Visit # 6   Past Medical History  Diagnosis Date  . Allergy   . Anemia   . GERD (gastroesophageal reflux disease)   . Migraines   . Shingles   . Atrial fibrillation [facility])     Past Surgical History  Procedure Laterality Date  . Ovarian cyst removal  1995    dr Edwyna Ready    There were no vitals filed for this visit.  Visit Diagnosis:  Right wrist pain  Weakness of wrist      Subjective Assessment - 08/14/15 1144    Subjective Pt reports today feeling good with Rt wrist. The pain is still present with certain movements.   Currently in Pain? No/denies   Pain Score 2    Pain Location Wrist   Pain Orientation Right   Pain Descriptors / Indicators Dull   Pain Type Acute pain   Pain Onset More than a month ago   Pain Frequency Intermittent   Multiple Pain Sites No                         [facility] Adult PT Treatment/Exercise - 08/14/15 0001    Exercises   Exercises Wrist   Wrist Exercises   Wrist Flexion AROM;Right;20 reps   Wrist Extension AROM;Right;20 reps   Other wrist exercises standing hands in extension, elbow into flexion extension to incre elongation   Ultrasound   Ultrasound Location Rt wrist   Ultrasound Parameters 50%, 3Mhz, 0.8 W/cm   Ultrasound Goals Pain   Iontophoresis   Type of Iontophoresis Dexamethasone   Location #5 RT wrist   Dose 1 ml   Time 6 hour wear time, placed on intact skin    Manual Therapy   Manual Therapy Soft tissue mobilization   Joint Mobilization right wrist for anterior and posterior glide, carpal mobilization    Soft tissue mobilization right wrist and right thumb extensor tendon   Passive ROM  right wrist for all motions in painfree range                  PT Short Term Goals - 08/06/15 1259    PT SHORT TERM GOAL #2   Title report 30% less Rt wrist pain with lifting heavy objects   Time 3   Period Weeks   Status Achieved  Pain 60%           PT Long Term Goals - 08/12/15 1249    PT LONG TERM GOAL #1   Title be independent in advanced HEP   Time 6   Period Weeks   Status On-going   PT LONG TERM GOAL #2   Title reduce FOTO to < or = to 32% limitation   Time 6   Period Weeks   Status On-going   PT LONG TERM GOAL #3   Title demonstrate 4+/5 Rt wrist strength to improve use of wrist with lifting    Time 6   Period Weeks   Status On-going   PT LONG TERM GOAL #4   Title report > or = to 90% use of Rt UE with ADLs and work tasks   Time 6   Period Weeks   Status On-going  PT LONG TERM GOAL #5   Title report 60% reduction in Rt wrist pain with lifting heavy objects and turning at the wrist   Time 6   Period Weeks   Status On-going               Plan - 08/14/15 1203    Clinical Impression Statement Pt with TP in Rt wrist and muscle tightness in right forearm extensors. Pt with feeling of stiffnessin Rt wrist. Pt will continue to benefit from skilled PT to improve ROM and strength in Rt wrist.    Pt will benefit from skilled therapeutic intervention in order to improve on the following deficits Decreased range of motion;Pain;Decreased endurance;Decreased activity tolerance;Impaired flexibility;Decreased strength;Decreased mobility   Rehab Potential Excellent   PT Frequency 2x / week   PT Duration 8 weeks   PT Treatment/Interventions Cryotherapy;Electrical Stimulation;Ultrasound;Moist Heat;Therapeutic activities;Therapeutic exercise;Neuromuscular re-education;Manual techniques;Patient/family education;Passive range of motion;Taping;Iontophoresis 4mg /ml Dexamethasone   PT Next Visit Plan Ionto #6, MMT wrist, work on strength, [place] and softtissue   PT  Home Exercise Plan progress as needed   Consulted and Agree with Plan of Care Patient        Problem List Patient Active Problem List   Diagnosis Date Noted  . Pain, dental 07/15/2015  . De Quervain's tenosynovitis, right 04/10/2015  . Pain in joint, shoulder region 02/22/2014  . Aortic insufficiency 01/29/2014  . Atrial fibrillation ([place]) 01/09/2014  . Chest pain 01/09/2014  . OTHER AND UNSPECIFIED OVARIAN CYST 09/08/2010  . GERD 08/10/2010  . Anxiety state 02/02/2010    NAUMANN-HOUEGNIFIO,Xzavien Harada PTA 08/14/2015, 12:07 PM  [facility] [address], [place], [place], [address] Phone: [phone]   Fax:  [phone]  Name: [name] MRN: [id number] Date of Birth: 1961-09-29

## 2015-08-20 ENCOUNTER — Ambulatory Visit: Payer: Self-pay

## 2015-08-21 ENCOUNTER — Ambulatory Visit (INDEPENDENT_AMBULATORY_CARE_PROVIDER_SITE_OTHER): Payer: Self-pay | Admitting: Internal Medicine

## 2015-08-21 ENCOUNTER — Encounter: Payer: Self-pay | Admitting: Internal Medicine

## 2015-08-21 VITALS — BP 128/64 | HR 75 | Ht 68.5 in | Wt 181.7 lb

## 2015-08-21 DIAGNOSIS — I480 Paroxysmal atrial fibrillation: Secondary | ICD-10-CM

## 2015-08-21 DIAGNOSIS — F411 Generalized anxiety disorder: Secondary | ICD-10-CM

## 2015-08-21 DIAGNOSIS — I351 Nonrheumatic aortic (valve) insufficiency: Secondary | ICD-10-CM

## 2015-08-21 NOTE — Progress Notes (Signed)
OFFICE NOTE  Chief Complaint:  No complaints  Primary Care Physician: Minerva Ends, MD  HPI:  [name] is a 54 y.o. female with a past medical history significant for migraines, allergic rhinitis, and GERD, but a strong family history of CAD and aneurysm. 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 Since the palpitations became persistent patient came to the ER. In the ER patient was found to be in A. fib with RVR and Cardizem 20 mg IV bolus was given after which patient's heart rate decreased but still in A. fib. Patient also after coming to the ER started developing some chest pressure for which patient was placed on nitroglycerin patch following which patient symptoms resolved. Shortly after she converted to sinus rhythm. She did have an extensive cardiac work-up by Dr. Pauline Aus (who is a retired cardiologist) about 15 years ago. During her hospitalization she spontaneously converted to sinus rhythm. She is placed on low-dose beta blocker and aspirin due to her low CHADSVASC score of 0. Outpatient stress testing was recommended due to her chest pain.  Unfortunately she was seen twice with recurrent chest pain symptoms. Some of which may be due to anxiety as it was relieved with a Xanax. 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 She also reports pain that starts from the neck and goes down the left arm.  She is not aware of recurrent atrial fibrillation.  Some Cronic back in the office today. She was recently seen by Roderic Palau, nurse practitioner in the A. fib clinic. She had breakthrough palpitations however to Her metoprolol and they subsided. She was not placed on antiarrhythmics and recommended to follow-up with me. Since that episode she's had no recurrent A. fib. She says that she's had 3  breakthrough episodes. She is maintained on aspirin 81 mg daily as her CHADSVASC score is now 1.   I saw Mr. Ferrante back in the office today.  She is doing well overall without any complaints. She denies any chest pain or worsening shortness of breath. She has not had any further palpitations or A. Fib to her knowledge. She continues on daily aspirin 162 mg. Recently she has had some low blood pressures in the morning. Subsequently she changed her Toprol-XL to only take at night and is not taking the morning dose. Blood pressure is well-controlled.  PMHx:  Past Medical History  Diagnosis Date  . Allergy   . Anemia   . GERD (gastroesophageal reflux disease)   . Migraines   . Shingles   . Atrial fibrillation [facility])     Past Surgical History  Procedure Laterality Date  . Ovarian cyst removal  1995    dr Edwyna Ready    FAMHx:  Family History  Problem Relation Age of Onset  . Heart disease Mother   . Cancer Father 64    lung ca  . Heart disease Father 71    mi/cabg  . Colon cancer Cousin 30    SOCHx:   reports that she has never smoked. She has never used smokeless tobacco. She reports that she does not drink alcohol or use illicit drugs.  ALLERGIES:  Allergies  Allergen Reactions  . Sausage [Pickled Meat] Other (See Comments)    Causes migraines  . Sulfamethoxazole Other (See Comments)  flushing    ROS: A comprehensive review of systems was negative.  HOME MEDS: Current Outpatient Prescriptions  Medication Sig Dispense Refill  . ascorbic acid (VITAMIN C) 500 MG tablet Take 500 mg by mouth daily.     Marland Kitchen aspirin 81 MG chewable tablet Chew 162 mg by mouth at bedtime.     Marland Kitchen b complex vitamins tablet Take 1 tablet by mouth daily.    . metoprolol succinate (TOPROL-XL) 25 MG 24 hr tablet Take 12.5 mg by mouth at bedtime.     Marland Kitchen OVER THE COUNTER MEDICATION Take 5 mLs by mouth daily. Omega 3 (with DHA) liquid    . Probiotic Product (PROBIOTIC FORMULA PO) Take 1 tablet by mouth  daily.      No current facility-administered medications for this visit.    LABS/IMAGING: No results found for this or any previous visit (from the past 48 hour(s)). No results found.  VITALS: BP 128/64 mmHg  Pulse 75  Ht 5' 8.5" (1.74 m)  Wt 181 lb 11.2 oz (82.419 kg)  BMI 27.22 kg/m2  EXAM: General appearance: alert and no distress Neck: no adenopathy, no carotid bruit, no JVD, supple, symmetrical, trachea midline and thyroid not enlarged, symmetric, no tenderness/mass/nodules Lungs: clear to auscultation bilaterally Heart: regular rate and rhythm, S1, S2 normal, no murmur, click, rub or gallop Abdomen: soft, non-tender; bowel sounds normal; no masses,  no organomegaly Extremities: extremities normal, atraumatic, no cyanosis or edema Pulses: 2+ and symmetric Skin: Skin color, texture, turgor normal. No rashes or lesions  EKG:  normal sinus rhythm at 75  ASSESSMENT: 1. Paroxysmal atrial fibrillation -  CHADSVASC score 1 2. Anxiety 3. Atypical chest pain - negative NST 4. Mild AI  PLAN: 1.   [name]  Has not had any significant recurrence of atrial fibrillation. She has a low CHADSVASC score and it is essentially because she's female. For now I would recommend she stay on low-dose aspirin. She does have mild aortic insufficiency by echo last year. I like to recheck that to see if it's been stable and then we could follow it clinically thereafter. I reassured her that I'm not concerned about any significant valvular disease. Plan to see her back annually or sooner as necessary.  Pixie Casino, MD, [facility] Attending Cardiologist Talmage C Hilty 08/21/2015, 4:28 PM

## 2015-08-21 NOTE — Patient Instructions (Signed)
Your physician wants you to follow-up in: 12 months with Dr. [name]. You will receive a reminder letter in the mail two months in advance. If you don't receive a letter, please call our office to schedule the follow-up appointment.  Your physician has requested that you have an echocardiogram @ [place] [facility]. Echocardiography is a painless test that uses sound waves to create images of your heart. It provides your doctor with information about the size and shape of your heart and how well your heart's chambers and valves are working. This procedure takes approximately one hour. There are no restrictions for this procedure.

## 2015-08-25 ENCOUNTER — Ambulatory Visit: Payer: Self-pay

## 2015-08-25 DIAGNOSIS — M25531 Pain in right wrist: Secondary | ICD-10-CM

## 2015-08-25 DIAGNOSIS — R29898 Other symptoms and signs involving the musculoskeletal system: Secondary | ICD-10-CM

## 2015-08-25 NOTE — Patient Instructions (Signed)

## 2015-08-25 NOTE — Therapy (Addendum)
[facility] [address] [place], [place], [address] Phone: [phone]   Fax:  [phone]  Physical Therapy Treatment  Patient Details  Name: [name] MRN: [id number] Date of Birth: May 18, 1961 No Data Recorded Lilia Argue, DO  Encounter Date: 08/25/2015      PT End of Session - 08/25/15 1007    Visit Number 6   Date for PT Re-Evaluation 09/03/15   PT Start Time 0935   PT Stop Time 1007   PT Time Calculation (min) 32 min   Activity Tolerance Patient tolerated treatment well   Behavior During Therapy [facility] for tasks assessed/performed      Past Medical History  Diagnosis Date  . Allergy   . Anemia   . GERD (gastroesophageal reflux disease)   . Migraines   . Shingles   . Atrial fibrillation [facility])     Past Surgical History  Procedure Laterality Date  . Ovarian cyst removal  1995    dr Edwyna Ready    There were no vitals filed for this visit.  Visit Diagnosis:  Right wrist pain  Weakness of wrist      Subjective Assessment - 08/25/15 0938    Subjective Pt reports no pain, just weakness in the Rt wrist.     Diagnostic tests x-ray: negative   Patient Stated Goals Patient wants to use right wrist, reduce pain   Currently in Pain? No/denies            [facility] PT Assessment - 08/25/15 0001    Observation/Other Assessments   Focus on Therapeutic Outcomes (FOTO)  33% limitation                     [facility] Adult PT Treatment/Exercise - 08/25/15 0001    Elbow Exercises   Wrist Flexion Strengthening;Right;Other reps (comment)  3x10   Theraband Level (Wrist Flexion) --  1# added   Wrist Extension Strengthening;Right;Other reps (comment)  3x10 reps- added 1#   Wrist Exercises   Wrist Radial Deviation Limitations radial deviation 1# added 3x10   Ultrasound   Ultrasound Location Rt wrist   Ultrasound Parameters 3 Mhz, 50% pulsed x 6 minutes   Ultrasound Goals Pain    Iontophoresis   Type of Iontophoresis Dexamethasone   Location #6 Rt wrist   Dose 1 ml   Time 6 hour wear time                PT Education - 08/25/15 1006    Education provided Yes   Education Details ionto info   Person(s) Educated Patient   Methods Explanation   Comprehension Verbalized understanding          PT Short Term Goals - 08/06/15 1259    PT SHORT TERM GOAL #2   Title report 30% less Rt wrist pain with lifting heavy objects   Time 3   Period Weeks   Status Achieved  Pain 60%           PT Long Term Goals - 08/25/15 7096    PT LONG TERM GOAL #1   Title be independent in advanced HEP   Time 6   Period Weeks   Status On-going   PT LONG TERM GOAL #4   Title report > or = to 90% use of Rt UE with ADLs and work tasks   Time 6   Period Weeks   Status On-going  80% use   PT LONG TERM GOAL #5  Title report 60% reduction in Rt wrist pain with lifting heavy objects and turning at the wrist   Status Achieved               Plan - 08/25/15 0941    Clinical Impression Statement Pt reports 80% use of Rt UE with ADLs and self-care.  Pt reports 80% reduction in pain.  Minimal to no pain reported in Rt wrist, just weakness reported.  No pain reported with added resistance and arm bike today.    Pt will benefit from skilled therapeutic intervention in order to improve on the following deficits Decreased range of motion;Pain;Decreased endurance;Decreased activity tolerance;Impaired flexibility;Decreased strength;Decreased mobility   Rehab Potential Excellent   PT Frequency 2x / week   PT Duration 8 weeks   PT Treatment/Interventions Cryotherapy;Electrical Stimulation;Ultrasound;Moist Heat;Therapeutic activities;Therapeutic exercise;Neuromuscular re-education;Manual techniques;Patient/family education;Passive range of motion;Taping;Iontophoresis 23m/ml Dexamethasone   PT Next Visit Plan D/C ionto, finalize HEP and D/C probable   Consulted and Agree with  Plan of Care Patient        Problem List Patient Active Problem List   Diagnosis Date Noted  . Pain, dental 07/15/2015  . De Quervain's tenosynovitis, right 04/10/2015  . Pain in joint, shoulder region 02/22/2014  . Aortic insufficiency 01/29/2014  . Atrial fibrillation ([place] 01/09/2014  . Chest pain 01/09/2014  . OTHER AND UNSPECIFIED OVARIAN CYST 09/08/2010  . GERD 08/10/2010  . Anxiety state 02/02/2010    TAKACS,KELLY, PT 08/25/2015, 10:09 AM PHYSICAL THERAPY DISCHARGE SUMMARY  Visits from Start of Care: 6  Current functional level related to goals / functional outcomes: See above for current status.  Pt with 80% improvement since the start of care.     Remaining deficits: No significant deficits remain at this time.  Pt has HEP in place for flexibility and gentle strength.     Education / Equipment: HEP Plan: Patient agrees to discharge.  Patient goals were partially met. Patient is being discharged due to being pleased with the current functional level.  ?????   KSigurd Sos PT 08/27/2015 10:34 AM  [facility] [address] [place] [address]: [phone]  Fax:  [phone] Name: DYARIS FERRELLMRN: [id number] of Birth: [birth year]

## 2015-08-26 ENCOUNTER — Ambulatory Visit: Payer: No Typology Code available for payment source | Attending: Family Medicine | Admitting: Family Medicine

## 2015-08-26 ENCOUNTER — Encounter: Payer: Self-pay | Admitting: Family Medicine

## 2015-08-26 VITALS — BP 109/72 | HR 62 | Temp 98.8°F | Resp 18 | Ht 68.0 in | Wt 181.0 lb

## 2015-08-26 DIAGNOSIS — K219 Gastro-esophageal reflux disease without esophagitis: Secondary | ICD-10-CM | POA: Insufficient documentation

## 2015-08-26 DIAGNOSIS — I480 Paroxysmal atrial fibrillation: Secondary | ICD-10-CM

## 2015-08-26 DIAGNOSIS — R531 Weakness: Secondary | ICD-10-CM | POA: Insufficient documentation

## 2015-08-26 DIAGNOSIS — M545 Low back pain, unspecified: Secondary | ICD-10-CM

## 2015-08-26 DIAGNOSIS — M25552 Pain in left hip: Secondary | ICD-10-CM | POA: Insufficient documentation

## 2015-08-26 DIAGNOSIS — Z882 Allergy status to sulfonamides status: Secondary | ICD-10-CM | POA: Insufficient documentation

## 2015-08-26 DIAGNOSIS — Z0000 Encounter for general adult medical examination without abnormal findings: Secondary | ICD-10-CM | POA: Insufficient documentation

## 2015-08-26 DIAGNOSIS — T754XXA Electrocution, initial encounter: Secondary | ICD-10-CM | POA: Insufficient documentation

## 2015-08-26 DIAGNOSIS — Z7982 Long term (current) use of aspirin: Secondary | ICD-10-CM | POA: Insufficient documentation

## 2015-08-26 DIAGNOSIS — I4891 Unspecified atrial fibrillation: Secondary | ICD-10-CM | POA: Insufficient documentation

## 2015-08-26 DIAGNOSIS — R0982 Postnasal drip: Secondary | ICD-10-CM | POA: Insufficient documentation

## 2015-08-26 LAB — COMPLETE METABOLIC PANEL WITH GFR
ALT: 18 U/L (ref 6–29)
AST: 20 U/L (ref 10–35)
Albumin: 4.1 g/dL (ref 3.6–5.1)
Alkaline Phosphatase: 79 U/L (ref 33–130)
BUN: 18 mg/dL (ref 7–25)
CHLORIDE: 101 mmol/L (ref 98–110)
CO2: 30 mmol/L (ref 20–31)
CREATININE: 0.6 mg/dL (ref 0.50–1.05)
Calcium: 9.6 mg/dL (ref 8.6–10.4)
GFR, Est Non African American: >89 mL/min (ref >=60)
Glucose, Bld: 84 mg/dL (ref 65–99)
POTASSIUM: 4.7 mmol/L (ref 3.5–5.3)
Sodium: 138 mmol/L (ref 135–146)
Total Bilirubin: 0.7 mg/dL (ref 0.2–1.2)
Total Protein: 7.5 g/dL (ref 6.1–8.1)

## 2015-08-26 LAB — POCT URINALYSIS DIPSTICK
BILIRUBIN UA: NEGATIVE
Glucose, UA: NEGATIVE
KETONES UA: NEGATIVE
LEUKOCYTES UA: NEGATIVE
NITRITE UA: NEGATIVE
PH UA: 5.5
PROTEIN UA: NEGATIVE
Spec Grav, UA: 1.025
Urobilinogen, UA: 0.2

## 2015-08-26 LAB — LIPID PANEL
CHOL/HDL RATIO: 2.7 ratio (ref <=5.0)
Cholesterol: 205 mg/dL — ABNORMAL HIGH (ref 125–200)
HDL: 77 mg/dL (ref >=46)
LDL CALC: 115 mg/dL (ref <130)
TRIGLYCERIDES: 64 mg/dL (ref <150)
VLDL: 13 mg/dL (ref <30)

## 2015-08-26 LAB — TSH: TSH: 2.272 u[IU]/mL (ref 0.350–4.500)

## 2015-08-26 LAB — T3, FREE: T3, Free: 3.2 pg/mL (ref 2.3–4.2)

## 2015-08-26 MED ORDER — MONTELUKAST SODIUM 10 MG PO TABS: 10.0000 mg | ORAL_TABLET | Freq: Every day | ORAL | Status: DC

## 2015-08-26 NOTE — Patient Instructions (Addendum)
Diagnoses and all orders for this visit:  Healthcare maintenance -     POCT urinalysis dipstick -     Vitamin D, 25-hydroxy -     MM DIGITAL SCREENING BILATERAL; Future  Left-sided low back pain without sciatica -     DG Lumbar Spine 2-3 Views; Future -     Ambulatory referral to Physical Therapy  Paroxysmal atrial fibrillation (HCC) -     COMPLETE METABOLIC PANEL WITH GFR -     Lipid Panel -     TSH -     T3, Free -     T4, free; Future  Post-nasal drip -     montelukast (SINGULAIR) 10 MG tablet; Take 1 tablet (10 mg total) by mouth at bedtime.   You will be contacted with results F/u in 6 months   Dr. Adrian Blackwater
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
## 2015-08-26 NOTE — Progress Notes (Signed)
Annual Physical  No problems today  No pain  No Hx tobacco

## 2015-08-27 ENCOUNTER — Ambulatory Visit: Payer: Self-pay

## 2015-08-27 LAB — VITAMIN D 25 HYDROXY (VIT D DEFICIENCY, FRACTURES): VIT D 25 HYDROXY: 35 ng/mL (ref 30–100)

## 2015-08-29 ENCOUNTER — Telehealth: Payer: Self-pay | Admitting: Family Medicine

## 2015-08-29 NOTE — Telephone Encounter (Signed)
Patient called and requested blood work results, please f/u

## 2015-09-01 ENCOUNTER — Ambulatory Visit (HOSPITAL_COMMUNITY)
Admission: RE | Admit: 2015-09-01 | Discharge: 2015-09-01 | Disposition: A | Discharge status: Home / Self Care | Payer: No Typology Code available for payment source | Source: Ambulatory Visit | Attending: Family Medicine | Admitting: Family Medicine

## 2015-09-01 ENCOUNTER — Encounter: Payer: Self-pay | Admitting: Physical Therapy

## 2015-09-01 ENCOUNTER — Other Ambulatory Visit: Payer: Self-pay

## 2015-09-01 ENCOUNTER — Ambulatory Visit (HOSPITAL_COMMUNITY): Payer: No Typology Code available for payment source | Attending: Cardiology

## 2015-09-01 DIAGNOSIS — I340 Nonrheumatic mitral (valve) insufficiency: Secondary | ICD-10-CM | POA: Insufficient documentation

## 2015-09-01 DIAGNOSIS — M2578 Osteophyte, vertebrae: Secondary | ICD-10-CM | POA: Insufficient documentation

## 2015-09-01 DIAGNOSIS — I351 Nonrheumatic aortic (valve) insufficiency: Secondary | ICD-10-CM | POA: Insufficient documentation

## 2015-09-01 DIAGNOSIS — M4807 Spinal stenosis, lumbosacral region: Secondary | ICD-10-CM | POA: Insufficient documentation

## 2015-09-01 DIAGNOSIS — M4316 Spondylolisthesis, lumbar region: Secondary | ICD-10-CM | POA: Insufficient documentation

## 2015-09-01 DIAGNOSIS — M545 Low back pain, unspecified: Secondary | ICD-10-CM

## 2015-09-01 DIAGNOSIS — I77810 Thoracic aortic ectasia: Secondary | ICD-10-CM | POA: Insufficient documentation

## 2015-09-01 DIAGNOSIS — I517 Cardiomegaly: Secondary | ICD-10-CM | POA: Insufficient documentation

## 2015-09-01 DIAGNOSIS — Z8249 Family history of ischemic heart disease and other diseases of the circulatory system: Secondary | ICD-10-CM | POA: Insufficient documentation

## 2015-09-03 ENCOUNTER — Encounter: Payer: Self-pay | Admitting: Physical Therapy

## 2015-09-05 NOTE — Telephone Encounter (Signed)
Patient is calling to obtain results. Please follow up with pt. Thank you.

## 2015-09-05 NOTE — Telephone Encounter (Signed)
Date of birth verified by pt  Xray and lab result given  Normal labs with only slightly elevated total cholesterol  Continue current medication regimen Lumbar x-ray reveals some degenerative disc disease the most significant being L5-S1 disc space narrowing this can be source of pain. PT recommended focusing on core strength and flexibility  Pain control  Pt verbalized understanding

## 2015-09-08 ENCOUNTER — Telehealth: Payer: Self-pay | Admitting: Internal Medicine

## 2015-09-08 NOTE — Telephone Encounter (Signed)
Mrs. Sales is calling about her echo results , please call

## 2015-09-08 NOTE — Telephone Encounter (Signed)
Results reported to patient.

## 2015-09-16 ENCOUNTER — Ambulatory Visit: Payer: No Typology Code available for payment source | Attending: Sports Medicine

## 2015-09-16 DIAGNOSIS — M25652 Stiffness of left hip, not elsewhere classified: Secondary | ICD-10-CM

## 2015-09-16 DIAGNOSIS — M791 Myalgia: Secondary | ICD-10-CM | POA: Insufficient documentation

## 2015-09-16 DIAGNOSIS — M545 Low back pain, unspecified: Secondary | ICD-10-CM

## 2015-09-16 DIAGNOSIS — M7918 Myalgia, other site: Secondary | ICD-10-CM
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
## 2015-09-16 NOTE — Patient Instructions (Signed)
Perform all exercises below:  Hold _20___ seconds. Repeat _3___ times.  Do __3__ sessions per day. CAUTION: Movement should be gentle, steady and slow.  Knee to Chest  Lying supine, bend involved knee to chest. Perform with each leg.  Copyright  VHI. All rights reserved.  Double Knee to Chest (Flexion)   Gently pull both knees toward chest. Feel stretch in lower back or buttock area. Breathing deeply, Lumbar Rotation: Caudal - Bilateral (Supine)  Feet and knees together, arms outstretched, rotate knees left, turning head in opposite direction, until stretch is felt.      HIP: Hamstrings - Short Sitting   Rest leg on raised surface. Keep knee straight. Lift chest.   Piriformis Stretch, Sitting    Sit, one ankle on opposite knee, same-side hand on crossed knee. Push down on knee, keeping spine straight. Lean torso forward, with flat back, until tension is felt in hamstrings and gluteals of crossed-leg side. Hold _20__ seconds.  Repeat _3__ times per session. Do _3__ sessions per day.  Copyright  VHI. All rights reserved.    [facility] [address], [address] [place], [place] [address] Phone # [phone] Fax [phone] 

## 2015-09-26 ENCOUNTER — Other Ambulatory Visit: Payer: Self-pay

## 2015-09-26 DIAGNOSIS — Z1231 Encounter for screening mammogram for malignant neoplasm of breast: Secondary | ICD-10-CM

## 2015-10-01 ENCOUNTER — Encounter: Payer: Self-pay | Admitting: Physical Therapy

## 2015-10-01 ENCOUNTER — Ambulatory Visit: Payer: No Typology Code available for payment source | Attending: Sports Medicine | Admitting: Physical Therapy

## 2015-10-01 DIAGNOSIS — M791 Myalgia: Secondary | ICD-10-CM | POA: Insufficient documentation

## 2015-10-01 DIAGNOSIS — M25652 Stiffness of left hip, not elsewhere classified: Secondary | ICD-10-CM

## 2015-10-01 DIAGNOSIS — M545 Low back pain, unspecified: Secondary | ICD-10-CM

## 2015-10-01 DIAGNOSIS — M7918 Myalgia, other site: Secondary | ICD-10-CM
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
## 2015-10-03 ENCOUNTER — Ambulatory Visit: Payer: No Typology Code available for payment source | Admitting: Physical Therapy

## 2015-10-03 DIAGNOSIS — M25652 Stiffness of left hip, not elsewhere classified: Secondary | ICD-10-CM

## 2015-10-03 DIAGNOSIS — M545 Low back pain, unspecified: Secondary | ICD-10-CM

## 2015-10-03 DIAGNOSIS — M7918 Myalgia, other site: Secondary | ICD-10-CM
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
## 2015-10-06 ENCOUNTER — Encounter: Payer: Self-pay | Admitting: Physical Therapy

## 2015-10-06 ENCOUNTER — Other Ambulatory Visit: Payer: Self-pay | Admitting: Internal Medicine

## 2015-10-06 ENCOUNTER — Ambulatory Visit: Payer: No Typology Code available for payment source | Admitting: Physical Therapy

## 2015-10-06 DIAGNOSIS — M545 Low back pain, unspecified: Secondary | ICD-10-CM

## 2015-10-06 DIAGNOSIS — M25652 Stiffness of left hip, not elsewhere classified: Secondary | ICD-10-CM

## 2015-10-06 DIAGNOSIS — M7918 Myalgia, other site: Secondary | ICD-10-CM
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
## 2015-10-08 ENCOUNTER — Ambulatory Visit: Payer: No Typology Code available for payment source

## 2015-10-08 DIAGNOSIS — M25652 Stiffness of left hip, not elsewhere classified: Secondary | ICD-10-CM

## 2015-10-08 DIAGNOSIS — M7918 Myalgia, other site: Secondary | ICD-10-CM

## 2015-10-08 DIAGNOSIS — M545 Low back pain, unspecified: Secondary | ICD-10-CM
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
## 2015-10-08 NOTE — Patient Instructions (Signed)
Piriformis (Supine)    Cross legs, right on top. Gently pull other knee toward chest until stretch is felt in buttock/hip of top leg. Hold _20___ seconds. Repeat _3___ times per set. Do _1__ sets per session. Do _3__ sessions per day.  http://orth.exer.us/677   Copyright  VHI. All rights reserved.  Bridge    Lie back, legs bent. Inhale, pressing hips up. Keeping ribs in, lengthen lower back. Exhale, rolling down along spine from top.  Hold 5 seconds. Repeat __10__ times. Do _1___ sessions per day.  http://pm.exer.us/55   Copyright  VHI. All rights reserved.  Straight Leg Raise    Squeeze pelvic floor and hold. Tighten top of left thigh. Raise leg off bed ___ inches. Hold for 1-2___ seconds. Relax for ___ seconds. Repeat _10__ times. Do 2___ times a day. Repeat with other leg.  [name] [address], [place], [place] [address] Phone # [phone] Fax [phone]

## 2015-10-10 ENCOUNTER — Telehealth: Payer: Self-pay | Admitting: Family Medicine

## 2015-10-10 NOTE — Telephone Encounter (Signed)
Pt. Called requesting a med refill on triamcinolone (KENALOG) 0.5 % cream. Please f/u with pt.

## 2015-10-13 ENCOUNTER — Telehealth: Payer: Self-pay | Admitting: Internal Medicine

## 2015-10-13 ENCOUNTER — Ambulatory Visit: Payer: No Typology Code available for payment source

## 2015-10-13 DIAGNOSIS — M545 Low back pain, unspecified: Secondary | ICD-10-CM

## 2015-10-13 DIAGNOSIS — M7918 Myalgia, other site: Secondary | ICD-10-CM

## 2015-10-13 DIAGNOSIS — M25652 Stiffness of left hip, not elsewhere classified: Secondary | ICD-10-CM

## 2015-10-13 NOTE — Telephone Encounter (Signed)
°  New Prob    Pt states she is experiencing lower back pain and PT is interested in trying a back stimulator treatment. Calling to verify this is OK with PMHx of A-Fib. Please call.

## 2015-10-13 NOTE — Telephone Encounter (Signed)
That is fine - back stimulator will not increase the risk of developing a-fib.  Dr. Lemmie Evens

## 2015-10-13 NOTE — Telephone Encounter (Signed)
Pt calling for Dr. Lysbeth Penner OK on back stimulation treatment for LBP.

## 2015-10-13 NOTE — Telephone Encounter (Signed)
Pt. Called requesting a med refill on triamcinolone (KENALOG) 0.5 % cream. Please f/u with pt.
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
## 2015-10-13 NOTE — Telephone Encounter (Signed)
Recommendations given to patient, who voiced understanding.

## 2015-10-15 ENCOUNTER — Encounter: Payer: Self-pay | Admitting: Physical Therapy

## 2015-10-15 ENCOUNTER — Ambulatory Visit: Payer: No Typology Code available for payment source | Admitting: Physical Therapy

## 2015-10-15 DIAGNOSIS — M545 Low back pain, unspecified: Secondary | ICD-10-CM

## 2015-10-15 DIAGNOSIS — M7918 Myalgia, other site: Secondary | ICD-10-CM

## 2015-10-15 DIAGNOSIS — M25652 Stiffness of left hip, not elsewhere classified: Secondary | ICD-10-CM
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
## 2015-10-22 MED ORDER — TRIAMCINOLONE ACETONIDE 0.5 % EX OINT: 1.0000 "application " | TOPICAL_OINTMENT | Freq: Two times a day (BID) | CUTANEOUS | Status: DC

## 2015-10-23 ENCOUNTER — Ambulatory Visit: Payer: No Typology Code available for payment source

## 2015-10-23 DIAGNOSIS — M545 Low back pain, unspecified: Secondary | ICD-10-CM

## 2015-10-23 DIAGNOSIS — M25652 Stiffness of left hip, not elsewhere classified: Secondary | ICD-10-CM

## 2015-10-23 DIAGNOSIS — M7918 Myalgia, other site: Secondary | ICD-10-CM
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
## 2015-10-31 ENCOUNTER — Ambulatory Visit
Admission: RE | Admit: 2015-10-31 | Discharge: 2015-10-31 | Disposition: A | Discharge status: Home / Self Care | Payer: No Typology Code available for payment source | Source: Ambulatory Visit

## 2015-10-31 DIAGNOSIS — Z1231 Encounter for screening mammogram for malignant neoplasm of breast: Secondary | ICD-10-CM

## 2015-11-05 ENCOUNTER — Ambulatory Visit: Payer: No Typology Code available for payment source | Attending: Family Medicine

## 2015-11-16 ENCOUNTER — Emergency Department (HOSPITAL_COMMUNITY): Payer: No Typology Code available for payment source

## 2015-11-16 ENCOUNTER — Encounter (HOSPITAL_COMMUNITY): Payer: Self-pay

## 2015-11-16 ENCOUNTER — Emergency Department (HOSPITAL_COMMUNITY)
Admission: EM | Admit: 2015-11-16 | Discharge: 2015-11-16 | Disposition: A | Discharge status: Home / Self Care | Payer: No Typology Code available for payment source | Attending: Emergency Medicine | Admitting: Emergency Medicine

## 2015-11-16 DIAGNOSIS — Z79899 Other long term (current) drug therapy: Secondary | ICD-10-CM | POA: Insufficient documentation

## 2015-11-16 DIAGNOSIS — Z7952 Long term (current) use of systemic steroids: Secondary | ICD-10-CM | POA: Insufficient documentation

## 2015-11-16 DIAGNOSIS — Z7982 Long term (current) use of aspirin: Secondary | ICD-10-CM | POA: Insufficient documentation

## 2015-11-16 DIAGNOSIS — R0789 Other chest pain: Secondary | ICD-10-CM | POA: Insufficient documentation

## 2015-11-16 DIAGNOSIS — Z862 Personal history of diseases of the blood and blood-forming organs and certain disorders involving the immune mechanism: Secondary | ICD-10-CM | POA: Insufficient documentation

## 2015-11-16 DIAGNOSIS — Z8739 Personal history of other diseases of the musculoskeletal system and connective tissue: Secondary | ICD-10-CM | POA: Insufficient documentation

## 2015-11-16 DIAGNOSIS — I4891 Unspecified atrial fibrillation: Secondary | ICD-10-CM | POA: Insufficient documentation

## 2015-11-16 DIAGNOSIS — Z8619 Personal history of other infectious and parasitic diseases: Secondary | ICD-10-CM | POA: Insufficient documentation

## 2015-11-16 LAB — BASIC METABOLIC PANEL
Anion gap: 11 (ref 5–15)
BUN: 14 mg/dL (ref 6–20)
CHLORIDE: 101 mmol/L (ref 101–111)
CO2: 27 mmol/L (ref 22–32)
CREATININE: 0.6 mg/dL (ref 0.44–1.00)
Calcium: 9.6 mg/dL (ref 8.9–10.3)
GFR calc Af Amer: >60 mL/min (ref >60)
GFR calc non Af Amer: >60 mL/min (ref >60)
Glucose, Bld: 95 mg/dL (ref 65–99)
POTASSIUM: 4.5 mmol/L (ref 3.5–5.1)
Sodium: 139 mmol/L (ref 135–145)

## 2015-11-16 LAB — CBC
HEMATOCRIT: 38.6 % (ref 36.0–46.0)
Hemoglobin: 12.8 g/dL (ref 12.0–15.0)
MCH: 30.3 pg (ref 26.0–34.0)
MCHC: 33.2 g/dL (ref 30.0–36.0)
MCV: 91.5 fL (ref 78.0–100.0)
PLATELETS: 288 10*3/uL (ref 150–400)
RBC: 4.22 MIL/uL (ref 3.87–5.11)
RDW: 13.5 % (ref 11.5–15.5)
WBC: 7.6 10*3/uL (ref 4.0–10.5)

## 2015-11-16 LAB — I-STAT TROPONIN, ED: Troponin i, poc: 0 ng/mL (ref 0.00–0.08)

## 2015-11-16 LAB — TROPONIN I

## 2015-11-16 MED ORDER — PREDNISONE 20 MG PO TABS
60.0000 mg | ORAL_TABLET | ORAL | Status: AC
  Administered 2015-11-16: 60 mg via ORAL
  Filled 2015-11-16: qty 3

## 2015-11-16 MED ORDER — PREDNISONE 20 MG PO TABS: 60.0000 mg | ORAL_TABLET | Freq: Every day | ORAL | Status: AC
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
## 2015-11-16 NOTE — ED Notes (Signed)
EDP at bedside  

## 2015-11-16 NOTE — Discharge Instructions (Signed)
As discussed, your evaluation today has been largely reassuring.  But, it is important that you monitor your condition carefully, and do not hesitate to return to the ED if you develop new, or concerning changes in your condition. ? ?Otherwise, please follow-up with your physician for appropriate ongoing care. ? ?

## 2015-11-16 NOTE — ED Notes (Signed)
Chest pain began 4 days ago  It is in her lt. Side of her chest and radiates into her back.  Pain is constant.  Describes it as pressure .  Heating pads decreases the pain and walking increases the pain.  She feels weak when she walks.  Two weeks ago pt. Was choking and they did the Heimlich Maneuver on the pt.  They squeezed her chest at first and then moved  To the abdomen.  She rep[orts that the pain in her back is a burning sharp intermittent pain and the lt. Chest is pressure.   Hx of A-fib and anxiety.  Skin is warm and dry and pink. No Acute distress. ECG done at Triage

## 2015-11-18 ENCOUNTER — Telehealth: Payer: Self-pay | Admitting: Physician Assistant

## 2015-11-18 NOTE — Telephone Encounter (Signed)
Patient called the after hour complaining of persistent chest pain and back pain. She has previous negative myoview in 2015. Few days ago, she had chocking episode and someone had to perform heimlich maneuver on her. She was seen in the ED on 1/22 for chest pain and back pain. She says she previously went into afib with RVR on ibuprofen therefore she was hesitant to take it. She eventually was precribed steroid. She says her chest pain has improved however she still has back pain.   She called the after hour cardiology office to see if there is any interaction between ibuprofen and steroid. She also asked if it is possible her heart was injured during heimlich maneuver. I told her chance of bruising of the heart from heimlich maneuver is low. I am not entirely sure what caused her back pain. I told her she can potentially try ibuprofen or toradol. Although she had afib before the ibuprofen, there is no clear significant cause/effect relationship between ibuprofen and afib. She says she called her PCP who is unable to see her for another 2 weeks. I told her that if she is worried about the symptom and she is having significant pain that prevent her from performing her daily activities, she may seek medical attention in ED again.   Hilbert Corrigan PA Pager: [phone]

## 2015-11-19 ENCOUNTER — Telehealth: Payer: Self-pay | Admitting: Family Medicine

## 2015-11-19 DIAGNOSIS — M25512 Pain in left shoulder: Secondary | ICD-10-CM

## 2015-11-19 NOTE — Telephone Encounter (Signed)
Called [name]. To ask if she could come in on 11/20/15 to see Dr. Jarold Song. [name]. Was giving an appointment for tomorrow.

## 2015-11-19 NOTE — Telephone Encounter (Signed)
Pt. Called requesting to speak ton nurse. Pt went to the Ed on 11/16/15 for chest pain and back pain. Pt. Was prescribed prednisone and stated it does help. Pt. Has an appointment on 12/01/15 at the [facility] and she said she can not wait that long. Pt. Would like to get referred out to get an MRI. Please f/u with pt.

## 2015-11-20 ENCOUNTER — Ambulatory Visit: Payer: No Typology Code available for payment source | Attending: Family Medicine | Admitting: Family Medicine

## 2015-11-20 ENCOUNTER — Encounter: Payer: Self-pay | Admitting: Family Medicine

## 2015-11-20 VITALS — BP 138/82 | HR 71 | Temp 97.7°F | Resp 15 | Ht 68.0 in | Wt 184.8 lb

## 2015-11-20 DIAGNOSIS — Z79899 Other long term (current) drug therapy: Secondary | ICD-10-CM | POA: Insufficient documentation

## 2015-11-20 DIAGNOSIS — Z131 Encounter for screening for diabetes mellitus: Secondary | ICD-10-CM

## 2015-11-20 DIAGNOSIS — M545 Low back pain: Secondary | ICD-10-CM | POA: Insufficient documentation

## 2015-11-20 DIAGNOSIS — IMO0001 Reserved for inherently not codable concepts without codable children: Secondary | ICD-10-CM

## 2015-11-20 DIAGNOSIS — M609 Myositis, unspecified: Secondary | ICD-10-CM | POA: Insufficient documentation

## 2015-11-20 DIAGNOSIS — M791 Myalgia: Secondary | ICD-10-CM

## 2015-11-20 DIAGNOSIS — Z7982 Long term (current) use of aspirin: Secondary | ICD-10-CM | POA: Insufficient documentation

## 2015-11-20 NOTE — Progress Notes (Signed)
Patient chocked on her food about 3 weeks ago and boyfriend gave her the hiemlich maneuver Patient states he "did it too high" and now she has burning pain in the left shoulder that radiates to her chest Also reports feelings of gas bubbles with belching Went to ED-r/o cardiac event and prescribed prednisone She finished taking that yesterday and it is still bothering her Xray in ED negative for Fx

## 2015-11-20 NOTE — Progress Notes (Signed)
Subjective:  Patient ID: [name], female    DOB: [birth year]  Age: 55 y.o. MRN: [id number]  CC: Shoulder Pain   HPI [name] presents complaining of 3 week history of left-sided chest wall pain, left upper and lower back pain ever since she received a hiemlich maneuver from her boyfriend when she was choking on food. She was seen at the emergency room where she had a chest x-ray with no acute finding and was prescribed prednisone which she states is not helping and is wondering if she can get an MRI today. 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 She also experienced excessive gassiness and bloating which has resolved at this time.  Outpatient Prescriptions Prior to Visit  Medication Sig Dispense Refill  . acetaminophen (TYLENOL) 500 MG tablet Take 1,000 mg by mouth 2 (two) times daily as needed for mild pain or headache.    Marland Kitchen ascorbic acid (VITAMIN C) 500 MG tablet Take 500 mg by mouth daily.     Marland Kitchen aspirin 81 MG chewable tablet Chew 162 mg by mouth at bedtime.     Marland Kitchen b complex vitamins tablet Take 1 tablet by mouth daily.    . metoprolol succinate (TOPROL-XL) 25 MG 24 hr tablet Take 12.5 mg by mouth at bedtime.     Marland Kitchen OVER THE COUNTER MEDICATION Take 5 mLs by mouth daily. Omega 3 (with DHA) liquid    . Probiotic Product (PROBIOTIC FORMULA PO) Take 1 tablet by mouth daily.     Marland Kitchen triamcinolone ointment (KENALOG) 0.5 % Apply 1 application topically 2 (two) times daily. 30 g 2  . montelukast (SINGULAIR) 10 MG tablet Take 1 tablet (10 mg total) by mouth at bedtime. (Patient not taking: Reported on 11/16/2015) 30 tablet 5   No facility-administered medications prior to visit.    ROS Review of Systems  Constitutional: Negative for activity change and appetite change.  HENT: Negative for sinus pressure and sore throat.   Respiratory:  Negative for chest tightness, shortness of breath and wheezing.   Cardiovascular: Negative for chest pain and palpitations.  Gastrointestinal: Negative for abdominal pain, constipation and abdominal distention.  Genitourinary: Negative.   Musculoskeletal:       See history of present illness  Psychiatric/Behavioral: Negative for behavioral problems and dysphoric mood.    Objective:  BP 138/82 mmHg  Pulse 71  Temp(Src) 97.7 F (36.5 C)  Resp 15  Ht 5\' 8"  (1.727 m)  Wt 184 lb 12.8 oz (83.825 kg)  BMI 28.11 kg/m2  SpO2 100%  BP/Weight 11/20/2015 11/16/2015 [id number]  Systolic BP 0000000 [id number] 0000000  Diastolic BP 82 78 72  Wt. (Lbs) 184.8 177 181  BMI 28.11 26.92 27.53      Physical Exam  Constitutional: She is oriented to person, place, and time. She appears well-developed and well-nourished.  Cardiovascular: Normal rate, normal heart sounds and intact distal pulses.   No murmur heard. Pulmonary/Chest: Effort normal and breath sounds normal. She has no wheezes. She has no rales. She exhibits no tenderness.  Abdominal: Soft. Bowel sounds are normal. She exhibits no distension and no mass. There is no tenderness.  Musculoskeletal: Normal range of motion.  Neurological: She is alert and oriented to person, place, and time.    CLINICAL DATA: Left-sided chest pain  EXAM: CHEST - 2 VIEW  COMPARISON:  11/13/2014  FINDINGS: The heart size and mediastinal contours are within normal limits. Both lungs are clear. The visualized skeletal structures are unremarkable.  IMPRESSION: No active disease.   Electronically Signed  By: Inez Catalina M.D.  On: 11/16/2015 15:14       Assessment & Plan:  1. Myalgia and myositis She is currently taking ibuprofen which she states works for her. Refuses a muscle relaxant because she says it will increase her heart rate. Advised to apply heat and gentle massage. I have reassured the patient that the pain is most likely  musculoskeletal and no MRI is indicated at this time. She will follow-up with the PCP at the next visit for evaluation of this pain.   No orders of the defined types were placed in this encounter.    Follow-up: Return in about 3 weeks (around 12/11/2015) for Follow-up of musculoskeletal pain with PCP- Dr Adrian Blackwater.   Arnoldo Morale MD

## 2015-11-20 NOTE — Patient Instructions (Signed)
Muscle Pain, Adult  Muscle pain (myalgia) may be caused by many things, including:  · Overuse or muscle strain, especially if you are not in shape. This is the most common cause of muscle pain.  · Injury.  · Bruises.  · Viruses, such as the flu.  · Infectious diseases.  · Fibromyalgia, which is a chronic condition that causes muscle tenderness, fatigue, and headache.  · Autoimmune diseases, including lupus.  · Certain drugs, including ACE inhibitors and statins.  Muscle pain may be mild or severe. In most cases, the pain lasts only a short time and goes away without treatment. To diagnose the cause of your muscle pain, your health care provider will take your medical history. This means he or she will ask you when your muscle pain began and what has been happening. If you have not had muscle pain for very long, your health care provider may want to wait before doing much testing. If your muscle pain has lasted a long time, your health care provider may want to run tests right away. If your health care provider thinks your muscle pain may be caused by illness, you may need to have additional tests to rule out certain conditions.   Treatment for muscle pain depends on the cause. Home care is often enough to relieve muscle pain. Your health care provider may also prescribe anti-inflammatory medicine.  HOME CARE INSTRUCTIONS  Watch your condition for any changes. The following actions may help to lessen any discomfort you are feeling:  · Only take over-the-counter or prescription medicines as directed by your health care provider.  · Apply ice to the sore muscle:    Put ice in a plastic bag.    Place a towel between your skin and the bag.    Leave the ice on for 15-20 minutes, 3-4 times a day.  · You may alternate applying hot and cold packs to the muscle as directed by your health care provider.  · If overuse is causing your muscle pain, slow down your activities until the pain goes away.    Remember that it is normal  to feel some muscle pain after starting a workout program. Muscles that have not been used often will be sore at first.    Do regular, gentle exercises if you are not usually active.    Warm up before exercising to lower your risk of muscle pain.  · Do not continue working out if the pain is very bad. Bad pain could mean you have injured a muscle.  SEEK MEDICAL CARE IF:  · Your muscle pain gets worse, and medicines do not help.  · You have muscle pain that lasts longer than 3 days.  · You have a rash or fever along with muscle pain.  · You have muscle pain after a tick bite.  · You have muscle pain while working out, even though you are in good physical condition.  · You have redness, soreness, or swelling along with muscle pain.  · You have muscle pain after starting a new medicine or changing the dose of a medicine.  SEEK IMMEDIATE MEDICAL CARE IF:  · You have trouble breathing.  · You have trouble swallowing.  · You have muscle pain along with a stiff neck, fever, and vomiting.  · You have severe muscle weakness or cannot move part of your body.  MAKE SURE YOU:   · Understand these instructions.  · Will watch your condition.  · Will get   help right away if you are not doing well or get worse.     This information is not intended to replace advice given to you by your health care provider. Make sure you discuss any questions you have with your health care provider.     Document Released: 09/02/2006 Document Revised: 11/01/2014 Document Reviewed: 08/07/2013  Elsevier Interactive Patient Education ©2016 [employer]

## 2015-11-21 MED FILL — METOPROLOL SUCC ER 25 MG TA: 25 | 30 days supply | Qty: 45 | Fill #5

## 2015-11-21 NOTE — Telephone Encounter (Signed)
Pt. Called stating that she was offered Flexeril and Tramadol on her visit on 11/20/15 by Dr. Jarold Song. PT. Refused the medication and spoke to her heart doctor and was told it was ok for her to take the medication. Pt. Would like to know is she could get the medication before the weekend. Please f/u.

## 2015-11-25 NOTE — Telephone Encounter (Signed)
Patient called requesting to speak to nurse regarding medication flexeril and tramadol, patient states medication was offered during visit but needed to verify with heart doctor first, please f/u with patient

## 2015-11-27 MED ORDER — TRAMADOL HCL 50 MG PO TABS: 50.0000 mg | ORAL_TABLET | Freq: Three times a day (TID) | ORAL | Status: DC | PRN

## 2015-11-27 MED ORDER — CYCLOBENZAPRINE HCL 10 MG PO TABS: 10.0000 mg | ORAL_TABLET | Freq: Three times a day (TID) | ORAL | Status: DC | PRN

## 2015-11-27 MED FILL — ?CYCLOBENZAPRINE 10 MG TABL: 10 | 10 days supply | Qty: 30 | Fill #0

## 2015-11-27 NOTE — Telephone Encounter (Signed)
Pt. Called stating that she was offered Flexeril and Tramadol on her visit on 11/20/15 by Dr. Jarold Song. PT. Refused the medication and spoke to her heart doctor and was told it was ok for her to take the medication. Pt. Would like to know is she could get the medication before the weekend. Please f/u

## 2015-11-27 NOTE — Telephone Encounter (Signed)
Flexeril sent to pharmacy Tramadol ready for pick up

## 2015-11-27 NOTE — Telephone Encounter (Signed)
Pt notified Rx at front office  

## 2015-12-01 ENCOUNTER — Inpatient Hospital Stay: Payer: No Typology Code available for payment source | Admitting: Family Medicine

## 2015-12-08 ENCOUNTER — Ambulatory Visit: Payer: No Typology Code available for payment source | Attending: Family Medicine | Admitting: Family Medicine

## 2015-12-08 ENCOUNTER — Encounter: Payer: Self-pay | Admitting: Family Medicine

## 2015-12-08 VITALS — BP 114/76 | HR 66 | Temp 97.9°F | Resp 16 | Ht 68.0 in | Wt 183.0 lb

## 2015-12-08 DIAGNOSIS — J209 Acute bronchitis, unspecified: Secondary | ICD-10-CM

## 2015-12-08 DIAGNOSIS — R079 Chest pain, unspecified: Secondary | ICD-10-CM | POA: Insufficient documentation

## 2015-12-08 DIAGNOSIS — M549 Dorsalgia, unspecified: Secondary | ICD-10-CM | POA: Insufficient documentation

## 2015-12-08 DIAGNOSIS — R05 Cough: Secondary | ICD-10-CM | POA: Insufficient documentation

## 2015-12-08 DIAGNOSIS — Z7982 Long term (current) use of aspirin: Secondary | ICD-10-CM | POA: Insufficient documentation

## 2015-12-08 NOTE — Progress Notes (Signed)
F/U UC visit due to bronchitis. Taking OTC Musinex  No pain today  No tobacco user  No suicidal thought in the past two weeks

## 2015-12-08 NOTE — Patient Instructions (Addendum)
[name],  Recent bronchitis. With clear lungs and no fever, chest pain or shortness of breath to suggest pneumonia.  For chest congestion you may continue mucin ex as needed. For nasal/sinus congestion I recommend robitussin-DM for max of 3 days   Follow up as needed  Dr. Adrian Blackwater

## 2015-12-08 NOTE — Progress Notes (Signed)
   Subjective:  Patient ID: [name], female    DOB: 05-Jul-1961  Age: 55 y.o. MRN: [id number]  CC: Back Pain and Chest Pain   HPI [name] presents for    1. F/u L sided chest and back pain: she was diagnosed with bronchitis. She has completed amoxicillin. No CP or SOB. No fever or chills. Still with intermittently productive cough and nasal congestion.    Social History  Substance Use Topics  . Smoking status: Never Smoker   . Smokeless tobacco: Never Used  . Alcohol Use: No    Outpatient Prescriptions Prior to Visit  Medication Sig Dispense Refill  . acetaminophen (TYLENOL) 500 MG tablet Take 1,000 mg by mouth 2 (two) times daily as needed for mild pain or headache.    Marland Kitchen ascorbic acid (VITAMIN C) 500 MG tablet Take 500 mg by mouth daily.     Marland Kitchen aspirin 81 MG chewable tablet Chew 162 mg by mouth at bedtime.     Marland Kitchen b complex vitamins tablet Take 1 tablet by mouth daily.    . cyclobenzaprine (FLEXERIL) 10 MG tablet Take 1 tablet (10 mg total) by mouth 3 (three) times daily as needed for muscle spasms. 30 tablet 0  . metoprolol succinate (TOPROL-XL) 25 MG 24 hr tablet Take 12.5 mg by mouth at bedtime.     Marland Kitchen OVER THE COUNTER MEDICATION Take 5 mLs by mouth daily. Omega 3 (with DHA) liquid    . Probiotic Product (PROBIOTIC FORMULA PO) Take 1 tablet by mouth daily.     . traMADol (ULTRAM) 50 MG tablet Take 1 tablet (50 mg total) by mouth every 8 (eight) hours as needed. 30 tablet 0  . triamcinolone ointment (KENALOG) 0.5 % Apply 1 application topically 2 (two) times daily. 30 g 2   No facility-administered medications prior to visit.    ROS Review of Systems  Constitutional: Negative for fever and chills.  Eyes: Negative for visual disturbance.  Respiratory: Positive for cough. Negative for shortness of breath.   Cardiovascular: Negative for chest pain.  Gastrointestinal: Negative for abdominal pain and blood in stool.  Musculoskeletal: Negative for back pain  and arthralgias.  Skin: Negative for rash.  Allergic/Immunologic: Negative for immunocompromised state.  Hematological: Negative for adenopathy. Does not bruise/bleed easily.  Psychiatric/Behavioral: Negative for suicidal ideas and dysphoric mood.    Objective:  BP 114/76 mmHg  Pulse 66  Temp(Src) 97.9 F (36.6 C) (Oral)  Resp 16  Ht 5\' 8"  (1.727 m)  Wt 183 lb (83.008 kg)  BMI 27.83 kg/m2  SpO2 96%  BP/Weight 12/08/2015 11/20/2015 [id number]  Systolic BP 99991111 [id number]  Diastolic BP 76 82 78  Wt. (Lbs) 183 184.8 177  BMI 27.83 28.11 26.92    Physical Exam  Constitutional: She is oriented to person, place, and time. She appears well-developed and well-nourished. No distress.  HENT:  Head: Normocephalic and atraumatic.  Mouth/Throat:    Cardiovascular: Normal rate, regular rhythm, normal heart sounds and intact distal pulses.   Pulmonary/Chest: Effort normal and breath sounds normal.  Musculoskeletal: She exhibits no edema.  Neurological: She is alert and oriented to person, place, and time.  Skin: Skin is warm and dry. No rash noted.  Psychiatric: She has a normal mood and affect.    Assessment & Plan:    No orders of the defined types were placed in this encounter.    Follow-up: No Follow-up on file.   Boykin Nearing MD

## 2015-12-09 ENCOUNTER — Other Ambulatory Visit (HOSPITAL_COMMUNITY)
Admission: RE | Admit: 2015-12-09 | Discharge: 2015-12-09 | Disposition: A | Discharge status: Home / Self Care | Payer: No Typology Code available for payment source | Source: Ambulatory Visit | Attending: Emergency Medicine | Admitting: Emergency Medicine

## 2015-12-09 ENCOUNTER — Emergency Department (HOSPITAL_COMMUNITY)
Admission: EM | Admit: 2015-12-09 | Discharge: 2015-12-09 | Disposition: A | Discharge status: Home / Self Care | Payer: No Typology Code available for payment source | Source: Home / Self Care | Attending: Emergency Medicine | Admitting: Emergency Medicine

## 2015-12-09 ENCOUNTER — Encounter (HOSPITAL_COMMUNITY): Payer: Self-pay | Admitting: Emergency Medicine

## 2015-12-09 ENCOUNTER — Telehealth: Payer: Self-pay | Admitting: Family Medicine

## 2015-12-09 DIAGNOSIS — N390 Urinary tract infection, site not specified: Secondary | ICD-10-CM | POA: Insufficient documentation

## 2015-12-09 LAB — POCT URINALYSIS DIP (DEVICE)
Bilirubin Urine: NEGATIVE
GLUCOSE, UA: NEGATIVE mg/dL
Ketones, ur: NEGATIVE mg/dL
Nitrite: NEGATIVE
Protein, ur: NEGATIVE mg/dL
SPECIFIC GRAVITY, URINE: 1.01 (ref 1.005–1.030)
UROBILINOGEN UA: 0.2 mg/dL (ref 0.0–1.0)
pH: 6.5 (ref 5.0–8.0)

## 2015-12-09 MED ORDER — CEPHALEXIN 500 MG PO CAPS: 500.0000 mg | ORAL_CAPSULE | Freq: Four times a day (QID) | ORAL | Status: DC

## 2015-12-09 NOTE — Telephone Encounter (Signed)
Returned pt call. Pt stated has Hx cystitis  Has blood and  burning with urination  Pt advised to go to [facility]

## 2015-12-09 NOTE — Telephone Encounter (Signed)
Pt saw Dr Adrian Blackwater yesterday ans now she is having severe urination pain & hematuria .Please, call her  thank you
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
## 2015-12-09 NOTE — ED Notes (Signed)
Patient has concerns for uti, mentioning light blood.  Noticed last night.

## 2015-12-09 NOTE — Discharge Instructions (Signed)
You have a urinary tract infection. Take Keflex 4 times a day for 3 days. I have sent your urine for culture. If we need to change antibiotics, we will call you. Make sure you are drinking plenty of water. Follow-up as needed.

## 2015-12-12 LAB — URINE CULTURE: Culture: >=100000

## 2015-12-13 ENCOUNTER — Emergency Department (INDEPENDENT_AMBULATORY_CARE_PROVIDER_SITE_OTHER)
Admission: EM | Admit: 2015-12-13 | Discharge: 2015-12-13 | Disposition: A | Discharge status: Home / Self Care | Payer: No Typology Code available for payment source | Source: Home / Self Care | Attending: Emergency Medicine | Admitting: Emergency Medicine

## 2015-12-13 ENCOUNTER — Encounter (HOSPITAL_COMMUNITY): Payer: Self-pay | Admitting: *Deleted

## 2015-12-13 ENCOUNTER — Other Ambulatory Visit (HOSPITAL_COMMUNITY)
Admission: RE | Admit: 2015-12-13 | Discharge: 2015-12-13 | Disposition: A | Discharge status: Home / Self Care | Payer: No Typology Code available for payment source | Source: Ambulatory Visit | Attending: Emergency Medicine | Admitting: Emergency Medicine

## 2015-12-13 DIAGNOSIS — N760 Acute vaginitis: Secondary | ICD-10-CM

## 2015-12-13 DIAGNOSIS — L259 Unspecified contact dermatitis, unspecified cause: Secondary | ICD-10-CM

## 2015-12-13 LAB — POCT URINALYSIS DIP (DEVICE)
Bilirubin Urine: NEGATIVE
GLUCOSE, UA: NEGATIVE mg/dL
HGB URINE DIPSTICK: NEGATIVE
Ketones, ur: NEGATIVE mg/dL
Leukocytes, UA: NEGATIVE
NITRITE: NEGATIVE
PROTEIN: NEGATIVE mg/dL
SPECIFIC GRAVITY, URINE: 1.015 (ref 1.005–1.030)
Urobilinogen, UA: 0.2 mg/dL (ref 0.0–1.0)
pH: 6.5 (ref 5.0–8.0)

## 2015-12-13 LAB — POCT PREGNANCY, URINE: Preg Test, Ur: NEGATIVE

## 2015-12-13 MED ORDER — NYSTATIN-TRIAMCINOLONE 100000-0.1 UNIT/GM-% EX OINT: 1.0000 "application " | TOPICAL_OINTMENT | Freq: Two times a day (BID) | CUTANEOUS | Status: DC

## 2015-12-13 MED ORDER — FLUCONAZOLE 150 MG PO TABS: 150.0000 mg | ORAL_TABLET | Freq: Once | ORAL | Status: DC

## 2015-12-13 NOTE — Discharge Instructions (Signed)
Give [place] a working phone number so that we can contact you if we need to change your medications in any way. Start some probiotics and start eating some yogurt to help restore the vaginal flora. Stop wearing the pads if possible, if not, change them frequently. Continue the A+D Ointment. Follow-up with your primary care physician as needed, return here if getting worse.

## 2015-12-13 NOTE — ED Notes (Signed)
Was seen in [facility] 2/14 for UTI - treating with Keflex.  States it doesn't seem to be working.  Continues with dysuria - "but more on the outside".  Denies fevers.
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
## 2015-12-15 LAB — CERVICOVAGINAL ANCILLARY ONLY: Wet Prep (BD Affirm): NEGATIVE

## 2015-12-19 ENCOUNTER — Telehealth (HOSPITAL_COMMUNITY): Payer: Self-pay | Admitting: Emergency Medicine

## 2015-12-19 NOTE — ED Notes (Signed)
Called pt back... Per Dr. Valere Dross, pt is to come back and be checked out Pt was upset and said "forget about it"  Also notified of recent lab results from visit 2/14  Per Dr. Valere Dross,  Tests from urgent care visit 12/13/15, for candida (yeast), gardnerella (bacterial vaginosis), and trichomonas, are negative. Recheck or followup pcp/Josalyn Funches for persistent symptoms. Jodi Mourning MD  Adv pt if sx are not getting better to return  Pt verb understanding.

## 2015-12-19 NOTE — ED Notes (Signed)
Called pt and notified of recent lab results from visit 2/14 Pt ID'd properly... Reports feeling better and sx have subsided but still having dysuria Says it's a long wait to see PCP... Wants to know if we can call in another antibiotic to her pharmacy Adv pt I will talk to Dr. Valere Dross today since Dr. Alphonzo Cruise is not here.   Per Dr. Valere Dross,  Urine culture from 12/09/15 is growing E coli sensitive to keflex. Finish keflex prescription given at urgent care visit 12/09/15.  Recheck or followup with pcp/Josalyn Funches for persistent symptoms. Jodi Mourning MD   Adv pt if sx are not getting better to return  Pt verb understanding.

## 2016-03-08 ENCOUNTER — Other Ambulatory Visit: Payer: Self-pay | Admitting: Internal Medicine

## 2016-03-08 MED FILL — METOPROLOL SUCC ER 25 MG TA: 25 | 30 days supply | Qty: 135 | Fill #0

## 2016-03-08 NOTE — Telephone Encounter (Signed)
Rx request sent to pharmacy.  

## 2016-04-16 ENCOUNTER — Ambulatory Visit: Payer: No Typology Code available for payment source | Attending: Family Medicine

## 2016-05-24 ENCOUNTER — Telehealth: Payer: Self-pay | Admitting: Internal Medicine

## 2016-05-24 ENCOUNTER — Ambulatory Visit (INDEPENDENT_AMBULATORY_CARE_PROVIDER_SITE_OTHER): Payer: Self-pay | Admitting: Internal Medicine

## 2016-05-24 ENCOUNTER — Encounter: Payer: Self-pay | Admitting: Internal Medicine

## 2016-05-24 VITALS — BP 110/70 | HR 70 | Ht 68.0 in | Wt 183.0 lb

## 2016-05-24 DIAGNOSIS — F411 Generalized anxiety disorder: Secondary | ICD-10-CM

## 2016-05-24 DIAGNOSIS — I480 Paroxysmal atrial fibrillation: Secondary | ICD-10-CM

## 2016-05-24 MED ORDER — FLECAINIDE ACETATE 150 MG PO TABS: ORAL_TABLET | ORAL | 0 refills | Status: DC

## 2016-05-24 MED FILL — FLECAINIDE ACETATE 150 MG T: 150 | 5 days supply | Qty: 5 | Fill #0

## 2016-05-24 NOTE — Telephone Encounter (Signed)
Returned call to patient no answer.LMTC. 
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
## 2016-05-24 NOTE — Telephone Encounter (Signed)
Returned call to patient.She stated she wants the medication Dr.Hilty was telling her about at office visit this morning for AFib.Stated she wants prescription sent to [place].Message sent to Dr.Hlity.

## 2016-05-24 NOTE — Telephone Encounter (Signed)
We discussed a pocket-pill flecainide strategy. Ok to Rx for flecainide 150 mg tablets (#5). Take 1 tablet once per 24 hours as needed for a-fib that lasts more than 30 minutes.   Dr. Lemmie Evens

## 2016-05-24 NOTE — Telephone Encounter (Signed)
Rx(s) sent to pharmacy electronically.  

## 2016-05-24 NOTE — Patient Instructions (Addendum)
Medication Instructions:  Your physician recommends that you continue on your current medications as directed. Please refer to the Current Medication list given to you today.  Labwork: None ordered  Testing/Procedures: None ordered  Follow-Up: Your physician wants you to follow-up in: 6 MONTHS WITH DR HILTY. You will receive a reminder letter in the mail two months in advance. If you don't receive a letter, please call our office to schedule the follow-up appointment.   Any Other Special Instructions Will Be Listed Below (If Applicable).     If you need a refill on your cardiac medications before your next appointment, please call your pharmacy.   

## 2016-05-24 NOTE — Telephone Encounter (Signed)
Spoke w/ patient. She had episode of A Fib last night around 1am. States she felt lightheaded and nauseous at onset of A Fib episode. EMS came out and performed EKG. Rate variable betw 50s-150s. Resolved to NSR ~3 hrs later.  She notes no symptoms this AM, (feels tired from lack of sleep but o/w no concerns).  She feels like she has stayed back in NSR, she checks her pulse manually. Has taken BP reading as well - notes her BP routinely runs low, consequently she is only taking the 12.5mg  metoprolol succ daily.  I did not advise extra bb as her systolic was in the 0000000 when checked.  Notes she has had "panicky" episodes falling asleep which are sometimes accompanied by palpitations but these are not typically sustained - last night was first time in a while. She feels for the most part metoprolol is working.  Pt and I discussed possibility of bringing in for A Fib clinic appt, she wanted to at least have confirmatory EKG. She is almost due for 1 yr f/u w Dr. [name].  Dr. [name] had same-day slot this AM (9:45) - pt added to schedule after verbal confirmation and primary RN aware.

## 2016-05-24 NOTE — Telephone Encounter (Signed)
Number dialed goes directly to VM. Left msg for patient to call.

## 2016-05-24 NOTE — Telephone Encounter (Signed)
New message    Pt calling b/c she stated that during her visit today the doctor offered her a pack of pills for AFIB. She thought about it and decided she would like to get the pills. She was not sure of the name of the pills. Please advise.

## 2016-05-24 NOTE — Telephone Encounter (Signed)
Returning your call. °

## 2016-05-24 NOTE — Telephone Encounter (Signed)
Pt calling regarding going to Afib last night early this am-called EMS -did not go to hospital -was told to call this am and get an appt asap this morning-just woke up so she is not sure if having any symptoms at the time of this call-pls advise  [phone]

## 2016-06-07 ENCOUNTER — Ambulatory Visit: Payer: Self-pay | Attending: Family Medicine | Admitting: Family Medicine

## 2016-06-07 ENCOUNTER — Encounter: Payer: Self-pay | Admitting: Family Medicine

## 2016-06-07 VITALS — BP 95/64 | HR 72 | Temp 97.8°F | Ht 68.0 in | Wt 183.0 lb

## 2016-06-07 DIAGNOSIS — M545 Low back pain, unspecified: Secondary | ICD-10-CM

## 2016-06-07 DIAGNOSIS — L989 Disorder of the skin and subcutaneous tissue, unspecified: Secondary | ICD-10-CM | POA: Insufficient documentation

## 2016-06-07 DIAGNOSIS — M5442 Lumbago with sciatica, left side: Secondary | ICD-10-CM | POA: Insufficient documentation

## 2016-06-07 DIAGNOSIS — N951 Menopausal and female climacteric states: Secondary | ICD-10-CM | POA: Insufficient documentation

## 2016-06-07 LAB — LIPID PANEL
CHOL/HDL RATIO: 2.3 ratio (ref <=5.0)
CHOLESTEROL: 195 mg/dL (ref 125–200)
HDL: 85 mg/dL (ref >=46)
LDL Cholesterol: 96 mg/dL (ref <130)
Triglycerides: 71 mg/dL (ref <150)
VLDL: 14 mg/dL (ref <30)

## 2016-06-07 MED ORDER — NAPROXEN 500 MG PO TABS: 500.0000 mg | ORAL_TABLET | Freq: Two times a day (BID) | ORAL | 0 refills | Status: AC

## 2016-06-07 MED ORDER — CYCLOBENZAPRINE HCL 10 MG PO TABS: 10.0000 mg | ORAL_TABLET | Freq: Three times a day (TID) | ORAL | 0 refills | Status: AC | PRN

## 2016-06-07 MED FILL — NAPROXEN 500 MG TABLET: 500 | 7 days supply | Qty: 14 | Fill #0

## 2016-06-07 MED FILL — ?CYCLOBENZAPRINE 10 MG TABL: 10 | 7 days supply | Qty: 21 | Fill #0

## 2016-06-07 NOTE — Progress Notes (Signed)
Subjective:  Patient ID: [name], female    DOB: 08/17/1961  Age: 55 y.o. MRN: [id number]  CC: Annual Exam   HPI BRODI CRUZ had A fib and aortic insufficiency, she  presents for wellness physical, but also had complaint of acute low back pain following fall two weeks ago. After agenda setting, we discussed her back pain.  1. Back pain: L sided low back with pain radiating down L leg. Started 2-3 week ago following slip and fall at [employer] on water left behind by a floor cleaner. She felt pain immediately but pain was not too bad at first. Now pain ranges from 2-10/10. This AM she had 10/10 pain in her low back with stiffness and trouble getting out of bed. Pain was reduced to 5/10 with ice for 30 minutes. She denies numbness in legs, fecal or urinary incontinence and leg weakness. She does have a hx of low back pain. She first had low back pain in her 68s. She had a injury at at 6 when she fell down some steps. She last had low back pain in 08/2015. No previous lumbar surgeries.   2. Derm referral: she reports hx of pre-cancerous skin lesion on her back that was biopsied. She has not seen derm in over 2 years. She would like a referral.   Social History  Substance Use Topics  . Smoking status: Never Smoker  . Smokeless tobacco: Never Used  . Alcohol use No    Outpatient Medications Prior to Visit  Medication Sig Dispense Refill  . ascorbic acid (VITAMIN C) 500 MG tablet Take 500 mg by mouth daily.     Marland Kitchen aspirin 81 MG chewable tablet Chew 162 mg by mouth at bedtime.     Marland Kitchen b complex vitamins tablet Take 1 tablet by mouth daily.    . Cholecalciferol (VITAMIN D3 PO) Take by mouth.    . metoprolol succinate (TOPROL-XL) 25 MG 24 hr tablet TAKE 1/2 TABLET BY MOUTH IN THE MORNING AND 1 TABLET IN THE EVENING 135 tablet 1  . nystatin-triamcinolone ointment (MYCOLOG) Apply 1 application topically 2 (two) times daily. 30 g 0  . Probiotic Product (PROBIOTIC FORMULA PO) Take  1 tablet by mouth daily.     Marland Kitchen acetaminophen (TYLENOL) 500 MG tablet Take 1,000 mg by mouth 2 (two) times daily as needed for mild pain or headache. Reported on 12/08/2015    . flecainide (TAMBOCOR) 150 MG tablet Take 1 tablet by mouth once every 24 hours for A-Fib that lasts longer than 30 minutes. 5 tablet 0   No facility-administered medications prior to visit.     ROS Review of Systems  Constitutional: Negative for chills and fever.  Eyes: Negative for visual disturbance.  Respiratory: Negative for shortness of breath.   Cardiovascular: Negative for chest pain.  Gastrointestinal: Negative for abdominal pain and blood in stool.  Musculoskeletal: Positive for back pain. Negative for arthralgias.  Skin: Positive for rash.  Allergic/Immunologic: Negative for immunocompromised state.  Hematological: Negative for adenopathy. Does not bruise/bleed easily.  Psychiatric/Behavioral: Positive for sleep disturbance. Negative for dysphoric mood and suicidal ideas.    Objective:  BP 95/64 (BP Location: Left Arm, Patient Position: Sitting, Cuff Size: Large)   Pulse 72   Temp 97.8 F (36.6 C) (Oral)   Ht 5\' 8"  (1.727 m)   Wt 183 lb (83 kg)   SpO2 99%   BMI 27.83 kg/m   BP/Weight 06/07/2016 05/24/2016 [id number]  Systolic BP 95 [id number]  A999333  Diastolic BP 64 70 82  Wt. (Lbs) 183 183 -  BMI 27.83 27.83 -   Physical Exam  Constitutional: She is oriented to person, place, and time. She appears well-developed and well-nourished. No distress.  HENT:  Head: Normocephalic and atraumatic.  Cardiovascular: Regular rhythm and intact distal pulses.   Pulmonary/Chest: Effort normal.  Musculoskeletal: She exhibits no edema.  Back Exam: Back: Normal Curvature, no deformities or CVA tenderness  Paraspinal Tenderness: L lumbar   LE Strength 5/5  LE Sensation: in tact  LE Reflexes 2+ and symmetric  Straight leg raise: negative    Neurological: She is alert and oriented to person, place, and time.    Skin: Skin is warm and dry. Rash noted.     Psychiatric: She has a normal mood and affect.     Assessment & Plan:  There are no diagnoses linked to this encounter. There are no diagnoses linked to this encounter.  No orders of the defined types were placed in this encounter.   Follow-up: No Follow-up on file.   Boykin Nearing MD

## 2016-06-07 NOTE — Progress Notes (Signed)
May 24, 2016 patient had a small A Fib epiosode

## 2016-06-07 NOTE — Patient Instructions (Signed)
[name] was seen today for annual exam.  Diagnoses and all orders for this visit:  Precancerous skin lesion -     Ambulatory referral to Dermatology  Left-sided low back pain without sciatica -     naproxen (NAPROSYN) 500 MG tablet; Take 1 tablet (500 mg total) by mouth 2 (two) times daily with a meal. For 7 days -     cyclobenzaprine (FLEXERIL) 10 MG tablet; Take 1 tablet (10 mg total) by mouth 3 (three) times daily as needed for muscle spasms (for 7 days).  Perimenopausal -     Lipid Panel -     Vitamin D, 25-hydroxy -     FSH/LH   F/u for low back pain in 6 weeks   Dr. Adrian Blackwater    Back Exercises If you have pain in your back, do these exercises 2-3 times each day or as told by your doctor. When the pain goes away, do the exercises once each day, but repeat the steps more times for each exercise (do more repetitions). If you do not have pain in your back, do these exercises once each day or as told by your doctor. EXERCISES Single Knee to Chest Do these steps 3-5 times in a row for each leg: 1. Lie on your back on a firm bed or the floor with your legs stretched out. 2. Bring one knee to your chest. 3. Hold your knee to your chest by grabbing your knee or thigh. 4. Pull on your knee until you feel a gentle stretch in your lower back. 5. Keep doing the stretch for 10-30 seconds. 6. Slowly let go of your leg and straighten it. Pelvic Tilt Do these steps 5-10 times in a row: 1. Lie on your back on a firm bed or the floor with your legs stretched out. 2. Bend your knees so they point up to the ceiling. Your feet should be flat on the floor. 3. Tighten your lower belly (abdomen) muscles to press your lower back against the floor. This will make your tailbone point up to the ceiling instead of pointing down to your feet or the floor. 4. Stay in this position for 5-10 seconds while you gently tighten your muscles and breathe evenly. Cat-Cow Do these steps until your lower back bends  more easily: 1. Get on your hands and knees on a firm surface. Keep your hands under your shoulders, and keep your knees under your hips. You may put padding under your knees. 2. Let your head hang down, and make your tailbone point down to the floor so your lower back is round like the back of a cat. 3. Stay in this position for 5 seconds. 4. Slowly lift your head and make your tailbone point up to the ceiling so your back hangs low (sags) like the back of a cow. 5. Stay in this position for 5 seconds. Press-Ups Do these steps 5-10 times in a row: 1. Lie on your belly (face-down) on the floor. 2. Place your hands near your head, about shoulder-width apart. 3. While you keep your back relaxed and keep your hips on the floor, slowly straighten your arms to raise the top half of your body and lift your shoulders. Do not use your back muscles. To make yourself more comfortable, you may change where you place your hands. 4. Stay in this position for 5 seconds. 5. Slowly return to lying flat on the floor. Bridges Do these steps 10 times in a row: 1. Shanda Howells  on your back on a firm surface. 2. Bend your knees so they point up to the ceiling. Your feet should be flat on the floor. 3. Tighten your butt muscles and lift your butt off of the floor until your waist is almost as high as your knees. If you do not feel the muscles working in your butt and the back of your thighs, slide your feet 1-2 inches farther away from your butt. 4. Stay in this position for 3-5 seconds. 5. Slowly lower your butt to the floor, and let your butt muscles relax. If this exercise is too easy, try doing it with your arms crossed over your chest. Belly Crunches Do these steps 5-10 times in a row: 1. Lie on your back on a firm bed or the floor with your legs stretched out. 2. Bend your knees so they point up to the ceiling. Your feet should be flat on the floor. 3. Cross your arms over your chest. 4. Tip your chin a little bit  toward your chest but do not bend your neck. 5. Tighten your belly muscles and slowly raise your chest just enough to lift your shoulder blades a tiny bit off of the floor. 6. Slowly lower your chest and your head to the floor. Back Lifts Do these steps 5-10 times in a row: 1. Lie on your belly (face-down) with your arms at your sides, and rest your forehead on the floor. 2. Tighten the muscles in your legs and your butt. 3. Slowly lift your chest off of the floor while you keep your hips on the floor. Keep the back of your head in line with the curve in your back. Look at the floor while you do this. 4. Stay in this position for 3-5 seconds. 5. Slowly lower your chest and your face to the floor. GET HELP IF:  Your back pain gets a lot worse when you do an exercise.  Your back pain does not lessen 2 hours after you exercise. If you have any of these problems, stop doing the exercises. Do not do them again unless your doctor says it is okay. GET HELP RIGHT AWAY IF:  You have sudden, very bad back pain. If this happens, stop doing the exercises. Do not do them again unless your doctor says it is okay.   This information is not intended to replace advice given to you by your health care provider. Make sure you discuss any questions you have with your health care provider.   Document Released: 11/13/2010 Document Revised: 07/02/2015 Document Reviewed: 12/05/2014 Elsevier Interactive Patient Education [employer].

## 2016-06-07 NOTE — Assessment & Plan Note (Signed)
Acute flare up of intermittent L low back pain following slip and fall in store, patient endorses radicular symptoms. Straight leg is negative on exam.   Plan: Short course of naproxen-NSAID Flexeril for one week Home PT

## 2016-06-07 NOTE — Progress Notes (Deleted)
SUBJECTIVE:  55 y.o. female for annual routine checkup.  Her last pap was normal on 05/16/2014.   Social History  Substance Use Topics  . Smoking status: Never Smoker  . Smokeless tobacco: Never Used  . Alcohol use No   Current Outpatient Prescriptions  Medication Sig Dispense Refill  . acetaminophen (TYLENOL) 500 MG tablet Take 1,000 mg by mouth 2 (two) times daily as needed for mild pain or headache. Reported on 12/08/2015    . ascorbic acid (VITAMIN C) 500 MG tablet Take 500 mg by mouth daily.     Marland Kitchen aspirin 81 MG chewable tablet Chew 162 mg by mouth at bedtime.     Marland Kitchen b complex vitamins tablet Take 1 tablet by mouth daily.    . Cholecalciferol (VITAMIN D3 PO) Take by mouth.    . flecainide (TAMBOCOR) 150 MG tablet Take 1 tablet by mouth once every 24 hours for A-Fib that lasts longer than 30 minutes. 5 tablet 0  . metoprolol succinate (TOPROL-XL) 25 MG 24 hr tablet TAKE 1/2 TABLET BY MOUTH IN THE MORNING AND 1 TABLET IN THE EVENING 135 tablet 1  . nystatin-triamcinolone ointment (MYCOLOG) Apply 1 application topically 2 (two) times daily. 30 g 0  . Probiotic Product (PROBIOTIC FORMULA PO) Take 1 tablet by mouth daily.      No current facility-administered medications for this visit.    Allergies: Sausage [pickled meat] and Sulfamethoxazole  No LMP recorded. Patient is not currently having periods (Reason: Perimenopausal).  ROS:  Feeling well. No dyspnea or chest pain on exertion.  No abdominal pain, change in bowel habits, black or bloody stools.  No urinary tract symptoms. GYN ROS: {gyn ros:315267::"normal menses, no abnormal bleeding, pelvic pain or discharge","no breast pain or new or enlarging lumps on self exam"}. No neurological complaints.  OBJECTIVE:  The patient appears well, alert, oriented x 3, in no distress. BP 95/64 (BP Location: Left Arm, Patient Position: Sitting, Cuff Size: Large)   Pulse 72   Temp 97.8 F (36.6 C) (Oral)   Ht 5\' 8"  (1.727 m)   Wt 183 lb (83 kg)    SpO2 99%   BMI 27.83 kg/m   Wt Readings from Last 3 Encounters:  06/07/16 183 lb (83 kg)  05/24/16 183 lb (83 kg)  12/08/15 183 lb (83 kg)    ENT normal.  Neck supple. No adenopathy or thyromegaly. PERLA. Lungs are clear, good air entry, no wheezes, rhonchi or rales. S1 and S2 normal, no murmurs, regular rate and rhythm. Abdomen soft without tenderness, guarding, mass or organomegaly. Extremities show no edema, normal peripheral pulses. Neurological is normal, no focal findings.  BREAST EXAM: {pe breast exam:315056::"breasts appear normal, no suspicious masses, no skin or nipple changes or axillary nodes"}  PELVIC EXAM: {pelvic exam:315900::"normal external genitalia, vulva, vagina, cervix, uterus and adnexa"}  ASSESSMENT:  {gyn assessment:315268::"well woman"}  PLAN:  {gyn plan:315269::"mammogram","pap smear","return annually or prn"}

## 2016-06-08 LAB — VITAMIN D 25 HYDROXY (VIT D DEFICIENCY, FRACTURES): VIT D 25 HYDROXY: 46 ng/mL (ref 30–100)

## 2016-06-08 LAB — FSH/LH
FSH: 125.3 m[IU]/mL — AB
LH: 52.1 m[IU]/mL

## 2016-06-10 ENCOUNTER — Telehealth: Payer: Self-pay | Admitting: Family Medicine

## 2016-06-10 NOTE — Telephone Encounter (Signed)
Pt called the office asking to speak to nurse regarding lab results. Pt also requested for written results to be mailed to her home address. Please follow up.  Thank you.

## 2016-06-14 ENCOUNTER — Telehealth: Payer: Self-pay | Admitting: Family Medicine

## 2016-06-14 DIAGNOSIS — M545 Low back pain, unspecified: Secondary | ICD-10-CM

## 2016-06-14 DIAGNOSIS — M4846XA Fatigue fracture of vertebra, lumbar region, initial encounter for fracture: Secondary | ICD-10-CM

## 2016-06-14 NOTE — Telephone Encounter (Signed)
Rn advised patient of Labs; patient verbalized understanding. No further questions at this time. Priscille Heidelberg, RN, BSN

## 2016-06-14 NOTE — Telephone Encounter (Signed)
Patient is wanting an MRI. Patient states that she's still experiencing back pain and that she's already completed an X-rays..  Patient is also wanting results from Blood work  Pease follow up.

## 2016-06-15 NOTE — Telephone Encounter (Signed)
Lumbar MRI ordered Please schedule and call patient with appt

## 2016-06-16 NOTE — Telephone Encounter (Signed)
Appointment was already set for 8/28 @5 :00.

## 2016-06-21 ENCOUNTER — Ambulatory Visit (HOSPITAL_COMMUNITY)
Admission: RE | Admit: 2016-06-21 | Discharge: 2016-06-21 | Disposition: A | Discharge status: Home / Self Care | Payer: Self-pay | Source: Ambulatory Visit | Attending: Family Medicine | Admitting: Family Medicine

## 2016-06-21 ENCOUNTER — Telehealth: Payer: Self-pay | Admitting: Internal Medicine

## 2016-06-21 ENCOUNTER — Telehealth: Payer: Self-pay | Admitting: Family Medicine

## 2016-06-21 DIAGNOSIS — M545 Low back pain, unspecified: Secondary | ICD-10-CM

## 2016-06-21 DIAGNOSIS — M5136 Other intervertebral disc degeneration, lumbar region: Secondary | ICD-10-CM | POA: Insufficient documentation

## 2016-06-21 DIAGNOSIS — M4846XA Fatigue fracture of vertebra, lumbar region, initial encounter for fracture: Secondary | ICD-10-CM

## 2016-06-21 DIAGNOSIS — M47816 Spondylosis without myelopathy or radiculopathy, lumbar region: Secondary | ICD-10-CM | POA: Insufficient documentation

## 2016-06-21 DIAGNOSIS — M5126 Other intervertebral disc displacement, lumbar region: Secondary | ICD-10-CM | POA: Insufficient documentation

## 2016-06-21 DIAGNOSIS — E2839 Other primary ovarian failure: Secondary | ICD-10-CM

## 2016-06-21 DIAGNOSIS — R601 Generalized edema: Secondary | ICD-10-CM | POA: Insufficient documentation

## 2016-06-21 NOTE — Telephone Encounter (Signed)
Pt received her results and wants to know by her numbers being in range with post menopause, does that mean she is in post menopause stage. Please F/U

## 2016-06-21 NOTE — Telephone Encounter (Signed)
New message   Pt verbalized that she is calling for rn she said she has two questions [place]  1. Its a portable EKG at home, and pt has A-Fib and she wants to try it, on the news  2. EKG has a marker that tells if your can have sudden cardiac deal th   Pt verbalized that she wanted to know if Dr.Hilty has any information, or if you participate with the mobil EKG on the information offered on the news, it was shown on [employer] a couple of weeks ago

## 2016-06-21 NOTE — Telephone Encounter (Signed)
Returned call to patient.She wanted to ask Dr.Hilty about [facility].Stated she saw commercial on TV and wanted to know if it would be worth buying to detect AFib.Also she heard a Dr.on TV talk about a marker that is on a patient's EKG that will determine a sudden cardiac event.She wanted to know if she has that marker on her EKG.Message sent to Dr.Hilty.

## 2016-06-21 NOTE — Telephone Encounter (Signed)
Returned call to patient left message on personal voice mail Dr.Hilty's advice.

## 2016-06-21 NOTE — Telephone Encounter (Signed)
Pt calling with questions about the letter she received about lab results  Pt states her question is concerning menopause

## 2016-06-21 NOTE — Telephone Encounter (Signed)
That device is FDA approved to evaluate a-fib. If you would feel more comfortable having it, I'm not against it. The "marker" for sudden cardiac death is QT prolongation - this is somewhat of a misnomer, because nothing predicts sudden death - it is just that if QT prolongation is detected, certain medications could cause life-threatening arrhythmias.    Dr. Lemmie Evens

## 2016-06-22 ENCOUNTER — Telehealth: Payer: Self-pay | Admitting: Family Medicine

## 2016-06-22 NOTE — Telephone Encounter (Signed)
Pt called to speak with nurse regarding MRI that she got done yesterday. As soon as we know results to please give her a call. Please follow up.

## 2016-06-23 NOTE — Telephone Encounter (Signed)
Will route to PCP 

## 2016-06-24 ENCOUNTER — Encounter: Payer: Self-pay | Admitting: Family Medicine

## 2016-06-24 DIAGNOSIS — M4846XA Fatigue fracture of vertebra, lumbar region, initial encounter for fracture: Secondary | ICD-10-CM | POA: Insufficient documentation

## 2016-06-24 MED ORDER — CALCIUM CITRATE 200 MG PO TABS: 400.0000 mg | ORAL_TABLET | Freq: Every day | ORAL | 0 refills | Status: DC

## 2016-06-24 NOTE — Telephone Encounter (Signed)
Called patient verified name and DOB.   Post menopause is based on labs and clinical findings. After a women has stopped having periods for one whole year she is considered post-menopausal. If it has been < 1 year but she is having menopause symptoms or labs in menopause range she is perimenopausal.   Reviewed MRI of lumbar back with patient.  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 There are bulging disc. There is new L5 lumbar stress fracture since x-ray done on 08/2015  Patient advised to continue vit D, add calcium Bone density scan Copy of MRI will be mailed to patient.

## 2016-06-24 NOTE — Telephone Encounter (Signed)
Yes PT would be helpful Referral placed [facility] on [address]

## 2016-06-24 NOTE — Telephone Encounter (Signed)
Patient called.

## 2016-06-24 NOTE — Telephone Encounter (Signed)
Will route to PCP 

## 2016-06-24 NOTE — Telephone Encounter (Signed)
Pt. Called requesting to speak with her PCP. Pt. Needs to know if she went to a chiropractic would it hurt a stress fracture. Please f/u with pt.

## 2016-06-24 NOTE — Telephone Encounter (Signed)
Pt. Called and would like to know if her PCP has any recommendations for physical therapy. Please f/u with pt.

## 2016-06-29 ENCOUNTER — Encounter: Payer: Self-pay | Admitting: Family Medicine

## 2016-06-29 NOTE — Telephone Encounter (Signed)
Pt. Called and would like to be referred to [facility] [place]. Pt. States it is closer to her house. Please f/u

## 2016-07-01 NOTE — Telephone Encounter (Signed)
Will route to PCP 

## 2016-07-01 NOTE — Telephone Encounter (Signed)
Pt has an appointment 07/07/16 @ 2:45pm at [place] location .  I guess [employer] transfer the patient there .
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
## 2016-07-02 ENCOUNTER — Encounter: Payer: Self-pay | Admitting: Family Medicine

## 2016-07-02 NOTE — Telephone Encounter (Signed)
Surgical intervention is likely not needed given that the findings were mild and there was a  "Thin linear T1 hypointensity within the left pedicle of L5 suspicious for a superimposed early/developing stress fracture".   Ortho could add additional pain control with possible injections.   So, it really depends on the patient's level of pain.   Patient called, verified name and DOB. She is taking naproxen and ibuprofen (600 mg) that does relieve pain but pain comes back after 12 hrs.   She is going to chiropractor but does not feel like it is helping. She has PT next week.  She is having to lift her dog who is about 15# , 2-3 times per day, but is not lifting more than that.  Plan: Offered additional pain medicine, tylenol #3 and tramadol, patient declined at this time. Orthopaedic referral placed.

## 2016-07-06 ENCOUNTER — Ambulatory Visit: Payer: Self-pay | Attending: Family Medicine | Admitting: Family Medicine

## 2016-07-06 ENCOUNTER — Encounter: Payer: Self-pay | Admitting: Family Medicine

## 2016-07-06 VITALS — BP 101/69 | HR 77 | Temp 98.2°F | Ht 68.0 in | Wt 182.8 lb

## 2016-07-06 DIAGNOSIS — Z23 Encounter for immunization: Secondary | ICD-10-CM | POA: Insufficient documentation

## 2016-07-06 DIAGNOSIS — L282 Other prurigo: Secondary | ICD-10-CM | POA: Insufficient documentation

## 2016-07-06 DIAGNOSIS — Z7982 Long term (current) use of aspirin: Secondary | ICD-10-CM | POA: Insufficient documentation

## 2016-07-06 DIAGNOSIS — Z0000 Encounter for general adult medical examination without abnormal findings: Secondary | ICD-10-CM | POA: Insufficient documentation

## 2016-07-06 DIAGNOSIS — I480 Paroxysmal atrial fibrillation: Secondary | ICD-10-CM | POA: Insufficient documentation

## 2016-07-06 LAB — COMPLETE METABOLIC PANEL WITH GFR
ALT: 15 U/L (ref 6–29)
AST: 20 U/L (ref 10–35)
Albumin: 4.1 g/dL (ref 3.6–5.1)
Alkaline Phosphatase: 67 U/L (ref 33–130)
BILIRUBIN TOTAL: 0.5 mg/dL (ref 0.2–1.2)
BUN: 17 mg/dL (ref 7–25)
CALCIUM: 9.2 mg/dL (ref 8.6–10.4)
CO2: 27 mmol/L (ref 20–31)
CREATININE: 0.62 mg/dL (ref 0.50–1.05)
Chloride: 103 mmol/L (ref 98–110)
GFR, Est Non African American: >89 mL/min (ref >=60)
Glucose, Bld: 82 mg/dL (ref 65–99)
Potassium: 4.5 mmol/L (ref 3.5–5.3)
Sodium: 138 mmol/L (ref 135–146)
TOTAL PROTEIN: 7.3 g/dL (ref 6.1–8.1)

## 2016-07-06 MED ORDER — METHYLPREDNISOLONE ACETATE 40 MG/ML IJ SUSP
40.0000 mg | Freq: Once | INTRAMUSCULAR | Status: AC
  Administered 2016-07-06: 40 mg via INTRAMUSCULAR

## 2016-07-06 NOTE — Progress Notes (Signed)
Patient has rash on left arm. States that she believe it may be poison ivy.  Chronic lower back pain, rated 5  Taking only 12.5 mg of metoprolol instead of 25 mg at night.

## 2016-07-06 NOTE — Progress Notes (Signed)
SUBJECTIVE:  55 y.o. female for annual routine check up.   She complains of rash on L arms and legs that is itchy. Unsure of cause.   Social History  Substance Use Topics  . Smoking status: Never Smoker  . Smokeless tobacco: Never Used  . Alcohol use No   Current Outpatient Prescriptions  Medication Sig Dispense Refill  . ascorbic acid (VITAMIN C) 500 MG tablet Take 500 mg by mouth daily.     Marland Kitchen aspirin 81 MG chewable tablet Chew 162 mg by mouth at bedtime.     Marland Kitchen b complex vitamins tablet Take 1 tablet by mouth daily.    . Calcium Citrate 200 MG TABS Take 2 tablets (400 mg total) by mouth daily.  0  . Cholecalciferol (VITAMIN D3 PO) Take by mouth.    . metoprolol succinate (TOPROL-XL) 25 MG 24 hr tablet TAKE 1/2 TABLET BY MOUTH IN THE MORNING AND 1 TABLET IN THE EVENING 135 tablet 1  . nystatin-triamcinolone ointment (MYCOLOG) Apply 1 application topically 2 (two) times daily. 30 g 0  . Probiotic Product (PROBIOTIC FORMULA PO) Take 1 tablet by mouth daily.     Marland Kitchen acetaminophen (TYLENOL) 500 MG tablet Take 1,000 mg by mouth 2 (two) times daily as needed for mild pain or headache. Reported on 12/08/2015    . flecainide (TAMBOCOR) 150 MG tablet Take 1 tablet by mouth once every 24 hours for A-Fib that lasts longer than 30 minutes. (Patient not taking: Reported on 07/06/2016) 5 tablet 0   No current facility-administered medications for this visit.    Allergies: Sausage [pickled meat] and Sulfamethoxazole  No LMP recorded. Patient is not currently having periods (Reason: Perimenopausal).  ROS:  Feeling well. No dyspnea or chest pain on exertion.  No abdominal pain, change in bowel habits, black or bloody stools.  No urinary tract symptoms. GYN ROS: normal menses, no abnormal bleeding, pelvic pain or discharge, no breast pain or new or enlarging lumps on self exam. No neurological complaints. MSK L side back pain. Intermittent sharp pains behind R ear.   OBJECTIVE:  The patient appears well,  alert, oriented x 3, in no distress. BP 101/69 (BP Location: Left Arm, Patient Position: Sitting, Cuff Size: Normal)   Pulse 77   Temp 98.2 F (36.8 C) (Oral)   Ht 5\' 8"  (1.727 m)   Wt 182 lb 12.8 oz (82.9 kg)   SpO2 97%   BMI 27.79 kg/m  ENT enlarged tonsil.   Neck supple. No adenopathy or thyromegaly. PERLA. Lungs are clear, good air entry, no wheezes, rhonchi or rales. S1 and S2 normal, no murmurs, regular rate and rhythm. Abdomen soft without tenderness, guarding, mass or organomegaly. Extremities show no edema, normal peripheral pulses. Neurological is normal, no focal findings. Erythematous bumpy rash   BREAST EXAM: breasts appear normal, no suspicious masses, no skin or nipple changes or axillary nodes  PELVIC EXAM: examination not indicated  ASSESSMENT:  well woman  PLAN:

## 2016-07-06 NOTE — Patient Instructions (Addendum)
Diagnoses and all orders for this visit:  Pruritic rash -     methylPREDNISolone acetate (DEPO-MEDROL) injection 40 mg; Inject 1 mL (40 mg total) into the muscle once.  Paroxysmal atrial fibrillation (HCC) -     COMPLETE METABOLIC PANEL WITH GFR -     TSH  Healthcare maintenance -     Ambulatory referral to Dentistry  Other orders -     Flu Vaccine QUAD 36+ mos IM    You will be called with lab results  F/u in 6 weeks for back pain   Dr. Adrian Blackwater

## 2016-07-06 NOTE — Assessment & Plan Note (Signed)
Suspect allergic type rash from bug bites or environmental exposure IM shot of depo medrol 40 mg given

## 2016-07-07 ENCOUNTER — Telehealth: Payer: Self-pay | Admitting: Family Medicine

## 2016-07-07 ENCOUNTER — Ambulatory Visit: Payer: No Typology Code available for payment source | Attending: Family Medicine

## 2016-07-07 DIAGNOSIS — M545 Low back pain: Secondary | ICD-10-CM | POA: Insufficient documentation

## 2016-07-07 DIAGNOSIS — M791 Myalgia: Secondary | ICD-10-CM | POA: Insufficient documentation

## 2016-07-07 DIAGNOSIS — M25652 Stiffness of left hip, not elsewhere classified: Secondary | ICD-10-CM | POA: Insufficient documentation

## 2016-07-07 DIAGNOSIS — R252 Cramp and spasm: Secondary | ICD-10-CM | POA: Insufficient documentation

## 2016-07-07 LAB — TSH: TSH: 1.64 m[IU]/L

## 2016-07-07 NOTE — Telephone Encounter (Signed)
Pt came to the office to speak with PCP regarding xrays. Pt stated that PCP had informed her that she needed xrays. Patient wants to know when and where she needs to get that done. Please follow up.   Thank you.

## 2016-07-13 ENCOUNTER — Ambulatory Visit: Payer: No Typology Code available for payment source

## 2016-07-13 DIAGNOSIS — M545 Low back pain, unspecified: Secondary | ICD-10-CM

## 2016-07-13 DIAGNOSIS — R252 Cramp and spasm: Secondary | ICD-10-CM
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
## 2016-07-13 NOTE — Telephone Encounter (Signed)
Pt. Called requesting her lab results. Please f/u with pt.  °

## 2016-07-13 NOTE — Patient Instructions (Addendum)
Perform all exercises below:  Hold _20___ seconds. Repeat _3___ times.  Do __3__ sessions per day. CAUTION: Movement should be gentle, steady and slow.  Knee to Chest  Lying supine, bend involved knee to chest. Perform with each leg.  Copyright  VHI. All rights reserved.  Double Knee to Chest (Flexion)   Gently pull both knees toward chest. Feel stretch in lower back or buttock area. Breathing deeply,  HIP: Hamstrings - Short Sitting   Rest leg on raised surface. Keep knee straight. Lift chest.   [facility] [address], [place], [place] [address] Phone # [phone] Fax [phone]

## 2016-07-14 NOTE — Telephone Encounter (Signed)
Pt was called and given results on 9/20 and pt wanted results mailed out to her.

## 2016-07-20 ENCOUNTER — Ambulatory Visit: Payer: No Typology Code available for payment source | Admitting: Physical Therapy

## 2016-07-20 DIAGNOSIS — R252 Cramp and spasm: Secondary | ICD-10-CM

## 2016-07-20 DIAGNOSIS — M7918 Myalgia, other site: Secondary | ICD-10-CM

## 2016-07-20 DIAGNOSIS — M545 Low back pain, unspecified: Secondary | ICD-10-CM

## 2016-07-20 DIAGNOSIS — M25652 Stiffness of left hip, not elsewhere classified: Secondary | ICD-10-CM

## 2016-07-20 NOTE — Patient Instructions (Signed)
Posterior Pelvic Tilts:  10 reps holding 5 seconds  Bridging with adductor squeeze:  10 reps holding 2 seconds  Trunk rotation in supine: 3 reps holding 10 seconds  Piriformis Stretch in supine: 3 reps holding 30 seconds  Supine hamstring stretch: 3 reps holding 30 seconds
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
## 2016-07-21 ENCOUNTER — Telehealth: Payer: Self-pay | Admitting: Family Medicine

## 2016-07-21 DIAGNOSIS — M4846XD Fatigue fracture of vertebra, lumbar region, subsequent encounter for fracture with routine healing: Secondary | ICD-10-CM

## 2016-07-21 NOTE — Telephone Encounter (Signed)
Patient called the office to speak with PCP regarding the follow up lumbar xray. Pt went to [facility] and order was not placed. Please follow up.  Thank you

## 2016-07-22 ENCOUNTER — Ambulatory Visit: Payer: No Typology Code available for payment source

## 2016-07-22 DIAGNOSIS — M545 Low back pain, unspecified: Secondary | ICD-10-CM

## 2016-07-22 DIAGNOSIS — R252 Cramp and spasm: Secondary | ICD-10-CM
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
## 2016-07-22 NOTE — Telephone Encounter (Signed)
Patient states she needs a bone density test , it needs to be sent to [place].  She also needs the lumbar xray

## 2016-07-22 NOTE — Patient Instructions (Addendum)

## 2016-07-23 NOTE — Telephone Encounter (Signed)
Pt states that she is to do a follow up xray of her lower back, but the order is not in. I will schedule her bone density scan.

## 2016-07-27 ENCOUNTER — Other Ambulatory Visit: Payer: Self-pay

## 2016-07-27 NOTE — Telephone Encounter (Signed)
Patient has lumbar MRI and was referred to ortho. X-ray is not needed as info is on MRI.

## 2016-07-28 ENCOUNTER — Ambulatory Visit: Payer: No Typology Code available for payment source | Attending: Family Medicine

## 2016-07-28 DIAGNOSIS — M791 Myalgia: Secondary | ICD-10-CM | POA: Insufficient documentation

## 2016-07-28 DIAGNOSIS — M545 Low back pain, unspecified: Secondary | ICD-10-CM

## 2016-07-28 DIAGNOSIS — M25652 Stiffness of left hip, not elsewhere classified: Secondary | ICD-10-CM | POA: Insufficient documentation

## 2016-07-28 DIAGNOSIS — R252 Cramp and spasm: Secondary | ICD-10-CM | POA: Insufficient documentation

## 2016-07-28 NOTE — Telephone Encounter (Signed)
Repeat lumbar x-ray ordered Patient can have this done at her earliest convenience
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
## 2016-07-28 NOTE — Telephone Encounter (Signed)
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

## 2016-07-29 NOTE — Telephone Encounter (Signed)
Pt was called on 10/05 and pt was informed of follow up xray that has been ordered.

## 2016-08-02 ENCOUNTER — Ambulatory Visit: Payer: No Typology Code available for payment source

## 2016-08-02 ENCOUNTER — Ambulatory Visit (HOSPITAL_COMMUNITY)
Admission: RE | Admit: 2016-08-02 | Discharge: 2016-08-02 | Disposition: A | Discharge status: Home / Self Care | Payer: No Typology Code available for payment source | Source: Ambulatory Visit | Attending: Family Medicine | Admitting: Family Medicine

## 2016-08-02 DIAGNOSIS — M5136 Other intervertebral disc degeneration, lumbar region: Secondary | ICD-10-CM | POA: Insufficient documentation

## 2016-08-02 DIAGNOSIS — R252 Cramp and spasm: Secondary | ICD-10-CM

## 2016-08-02 DIAGNOSIS — M25652 Stiffness of left hip, not elsewhere classified: Secondary | ICD-10-CM

## 2016-08-02 DIAGNOSIS — M545 Low back pain, unspecified: Secondary | ICD-10-CM

## 2016-08-02 DIAGNOSIS — M4846XD Fatigue fracture of vertebra, lumbar region, subsequent encounter for fracture with routine healing: Secondary | ICD-10-CM
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
## 2016-08-04 ENCOUNTER — Ambulatory Visit: Payer: No Typology Code available for payment source

## 2016-08-04 DIAGNOSIS — M545 Low back pain, unspecified: Secondary | ICD-10-CM

## 2016-08-04 DIAGNOSIS — M25652 Stiffness of left hip, not elsewhere classified: Secondary | ICD-10-CM

## 2016-08-04 DIAGNOSIS — R252 Cramp and spasm: Secondary | ICD-10-CM
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
## 2016-08-09 ENCOUNTER — Ambulatory Visit: Payer: No Typology Code available for payment source

## 2016-08-09 DIAGNOSIS — M545 Low back pain, unspecified: Secondary | ICD-10-CM

## 2016-08-09 DIAGNOSIS — R252 Cramp and spasm: Secondary | ICD-10-CM

## 2016-08-09 DIAGNOSIS — M25652 Stiffness of left hip, not elsewhere classified: Secondary | ICD-10-CM
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
## 2016-08-11 ENCOUNTER — Encounter: Payer: Self-pay | Admitting: Physical Therapy

## 2016-08-12 ENCOUNTER — Encounter: Payer: Self-pay | Admitting: Physical Therapy

## 2016-08-16 ENCOUNTER — Encounter: Payer: Self-pay | Admitting: Physical Therapy

## 2016-08-16 ENCOUNTER — Ambulatory Visit: Payer: No Typology Code available for payment source | Admitting: Physical Therapy

## 2016-08-16 DIAGNOSIS — M25652 Stiffness of left hip, not elsewhere classified: Secondary | ICD-10-CM

## 2016-08-16 DIAGNOSIS — M545 Low back pain, unspecified: Secondary | ICD-10-CM

## 2016-08-16 DIAGNOSIS — M7918 Myalgia, other site: Secondary | ICD-10-CM

## 2016-08-16 DIAGNOSIS — R252 Cramp and spasm: Secondary | ICD-10-CM
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
## 2016-08-18 ENCOUNTER — Ambulatory Visit: Payer: No Typology Code available for payment source | Attending: Internal Medicine

## 2016-08-19 ENCOUNTER — Ambulatory Visit: Payer: No Typology Code available for payment source

## 2016-08-19 DIAGNOSIS — M545 Low back pain, unspecified: Secondary | ICD-10-CM

## 2016-08-19 DIAGNOSIS — M25652 Stiffness of left hip, not elsewhere classified: Secondary | ICD-10-CM

## 2016-08-19 DIAGNOSIS — R252 Cramp and spasm: Secondary | ICD-10-CM
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
## 2016-08-23 ENCOUNTER — Ambulatory Visit: Payer: No Typology Code available for payment source | Admitting: Physical Therapy

## 2016-08-23 ENCOUNTER — Encounter: Payer: Self-pay | Admitting: Physical Therapy

## 2016-08-23 DIAGNOSIS — M545 Low back pain, unspecified: Secondary | ICD-10-CM

## 2016-08-23 DIAGNOSIS — M25652 Stiffness of left hip, not elsewhere classified: Secondary | ICD-10-CM

## 2016-08-23 DIAGNOSIS — R252 Cramp and spasm: Secondary | ICD-10-CM
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
## 2016-08-24 ENCOUNTER — Encounter: Payer: Self-pay | Admitting: Family Medicine

## 2016-08-24 ENCOUNTER — Ambulatory Visit: Payer: No Typology Code available for payment source | Attending: Family Medicine | Admitting: Family Medicine

## 2016-08-24 VITALS — BP 113/76 | HR 74 | Temp 98.2°F | Ht 68.0 in | Wt 182.6 lb

## 2016-08-24 DIAGNOSIS — Z7982 Long term (current) use of aspirin: Secondary | ICD-10-CM | POA: Insufficient documentation

## 2016-08-24 DIAGNOSIS — M545 Low back pain, unspecified: Secondary | ICD-10-CM

## 2016-08-24 DIAGNOSIS — Z79899 Other long term (current) drug therapy: Secondary | ICD-10-CM | POA: Insufficient documentation

## 2016-08-24 DIAGNOSIS — R21 Rash and other nonspecific skin eruption: Secondary | ICD-10-CM | POA: Insufficient documentation

## 2016-08-24 DIAGNOSIS — K0889 Other specified disorders of teeth and supporting structures: Secondary | ICD-10-CM

## 2016-08-24 DIAGNOSIS — L282 Other prurigo: Secondary | ICD-10-CM

## 2016-08-24 MED ORDER — TRIAMCINOLONE ACETONIDE 0.1 % EX CREA: 1.0000 "application " | TOPICAL_CREAM | Freq: Two times a day (BID) | CUTANEOUS | 2 refills | Status: DC

## 2016-08-24 NOTE — Progress Notes (Signed)
Subjective:  Patient ID: [name], female    DOB: [birth year]  Age: 55 y.o. MRN: [id number]  CC: Follow-up (back pain)   HPI [name] had A fib and aortic insufficiency, she  presents for wellness physical, but also had complaint of acute low back pain following fall two weeks ago. After agenda setting, we discussed her back pain.  1. Back pain: L sided low back with pain radiating down L leg. Started in late July, 2017 following slip and fall at [employer] on water left behind by a floor cleaner. She felt pain immediately but pain was not too bad at first. She has lumbar MRI that was concerning for ? Compression fracture of L5, she has PT and pain control, calcium and vit D. Repeat x-ray did not reveal compression fracture. She has seen ortho was reviewed her imaging and reported that she did not have compression fracture. He back pain has improved. Now 2/10 and intermittent. She only has slight pain with bending.   2. Dental pain: she has lost multiple crowns. She has dental sensitivity to hot and cold. No oral swelling. She has been referred to dentistry and inquires about the referral.   Social History  Substance Use Topics  . Smoking status: Never Smoker  . Smokeless tobacco: Never Used  . Alcohol use No    Outpatient Medications Prior to Visit  Medication Sig Dispense Refill  . acetaminophen (TYLENOL) 500 MG tablet Take 1,000 mg by mouth 2 (two) times daily as needed for mild pain or headache. Reported on 12/08/2015    . ascorbic acid (VITAMIN C) 500 MG tablet Take 500 mg by mouth daily.     Marland Kitchen aspirin 81 MG chewable tablet Chew 162 mg by mouth at bedtime.     Marland Kitchen b complex vitamins tablet Take 1 tablet by mouth daily.    . Calcium Citrate 200 MG TABS Take 2 tablets (400 mg total) by mouth daily.  0  . Cholecalciferol (VITAMIN D3 PO) Take by mouth.    . flecainide (TAMBOCOR) 150 MG tablet Take 1 tablet by mouth once every 24 hours for A-Fib that lasts longer than  30 minutes. 5 tablet 0  . metoprolol succinate (TOPROL-XL) 25 MG 24 hr tablet TAKE 1/2 TABLET BY MOUTH IN THE MORNING AND 1 TABLET IN THE EVENING 135 tablet 1  . nystatin-triamcinolone ointment (MYCOLOG) Apply 1 application topically 2 (two) times daily. 30 g 0  . Probiotic Product (PROBIOTIC FORMULA PO) Take 1 tablet by mouth daily.      No facility-administered medications prior to visit.     ROS Review of Systems  Constitutional: Negative for chills and fever.  HENT: Positive for dental problem.   Eyes: Negative for visual disturbance.  Respiratory: Negative for shortness of breath.   Cardiovascular: Negative for chest pain.  Gastrointestinal: Negative for abdominal pain and blood in stool.  Musculoskeletal: Positive for back pain. Negative for arthralgias.  Skin: Positive for rash.  Allergic/Immunologic: Negative for immunocompromised state.  Hematological: Negative for adenopathy. Does not bruise/bleed easily.  Psychiatric/Behavioral: Positive for sleep disturbance. Negative for dysphoric mood and suicidal ideas.    Objective:  BP 113/76 (BP Location: Left Arm, Patient Position: Sitting, Cuff Size: Small)   Pulse 74   Temp 98.2 F (36.8 C) (Oral)   Ht 5\' 8"  (1.727 m)   Wt 182 lb 9.6 oz (82.8 kg)   SpO2 95%   BMI 27.76 kg/m   BP/Weight 08/24/2016 07/06/2016 06/07/2016  Systolic BP 123456 [id number] 95  Diastolic BP 76 69 64  Wt. (Lbs) 182.6 182.8 183  BMI 27.76 27.79 27.83   Physical Exam  Constitutional: She is oriented to person, place, and time. She appears well-developed and well-nourished. No distress.  HENT:  Head: Normocephalic and atraumatic.  Mouth/Throat: Abnormal dentition.    Cardiovascular: Regular rhythm and intact distal pulses.   Pulmonary/Chest: Effort normal.  Musculoskeletal: She exhibits no edema.  Back Exam: Back: Normal Curvature, no deformities or CVA tenderness  Paraspinal Tenderness: L lumbar   LE Strength 5/5  LE Sensation: in tact  LE Reflexes  2+ and symmetric  Straight leg raise: negative    Neurological: She is alert and oriented to person, place, and time.  Skin: Skin is warm and dry. Rash noted.     Psychiatric: She has a normal mood and affect.     Assessment & Plan:  [name] was seen today for follow-up.  Diagnoses and all orders for this visit:  Pruritic rash -     triamcinolone cream (KENALOG) 0.1 %; Apply 1 application topically 2 (two) times daily.  Pain, dental  Acute left-sided low back pain without sciatica   There are no diagnoses linked to this encounter.  No orders of the defined types were placed in this encounter.   Follow-up: Return in about 3 months (around 11/24/2016) for check up .   Amber Nearing MD

## 2016-08-24 NOTE — Progress Notes (Signed)
Pt is here today to follow up on back pain. Pt needs refill on triamcinolone cream. Pt also wants to check status of dental referral.

## 2016-08-24 NOTE — Patient Instructions (Addendum)
[name] was seen today for follow-up.  Diagnoses and all orders for this visit:  Pruritic rash -     triamcinolone cream (KENALOG) 0.1 %; Apply 1 application topically 2 (two) times daily.  Pain, dental  Acute left-sided low back pain without sciatica    Sent  Referral to [facility] ph. # [place], [place] [address] They will contact the patient to schedule an appointment I don't know how long it will take.   F/u in 3 months for check up   Dr. Adrian Blackwater

## 2016-08-26 NOTE — Assessment & Plan Note (Signed)
Missing crowns Referral info given to patient, so she can call to follow up on appointment

## 2016-08-26 NOTE — Assessment & Plan Note (Signed)
A: improved since PT, no fracture on follow up imaging  P: Continue vit D and calcium Continue PT at home

## 2016-08-27 ENCOUNTER — Ambulatory Visit: Payer: No Typology Code available for payment source | Attending: Family Medicine

## 2016-08-27 DIAGNOSIS — M545 Low back pain, unspecified: Secondary | ICD-10-CM

## 2016-08-27 DIAGNOSIS — M791 Myalgia: Secondary | ICD-10-CM | POA: Insufficient documentation

## 2016-08-27 DIAGNOSIS — M25652 Stiffness of left hip, not elsewhere classified: Secondary | ICD-10-CM | POA: Insufficient documentation

## 2016-08-27 DIAGNOSIS — M7918 Myalgia, other site: Secondary | ICD-10-CM

## 2016-08-27 DIAGNOSIS — R252 Cramp and spasm: Secondary | ICD-10-CM | POA: Insufficient documentation
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
## 2016-08-31 ENCOUNTER — Ambulatory Visit: Payer: No Typology Code available for payment source | Admitting: Physical Therapy

## 2016-08-31 ENCOUNTER — Encounter: Payer: Self-pay | Admitting: Physical Therapy

## 2016-08-31 DIAGNOSIS — M545 Low back pain, unspecified: Secondary | ICD-10-CM

## 2016-08-31 DIAGNOSIS — R252 Cramp and spasm: Secondary | ICD-10-CM

## 2016-08-31 DIAGNOSIS — M25652 Stiffness of left hip, not elsewhere classified: Secondary | ICD-10-CM

## 2016-08-31 DIAGNOSIS — M7918 Myalgia, other site: Secondary | ICD-10-CM

## 2016-08-31 MED FILL — TRIAMCINOLONE 0.1% CREAM: 0.1 | 20 days supply | Qty: 80 | Fill #0
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
## 2016-08-31 NOTE — Patient Instructions (Signed)
Angry Cat, All Fours    Kneel on hands and knees. Tuck chin and tighten stomach. Exhale and round back upward. Inhale and arch back downward. Hold each position _10__ seconds. Repeat _3__ times per session.  Copyright  VHI. All rights reserved.  Hip Flexion / Knee Extension: Straight-Leg Raise (Eccentric)   Lie on back. Lift leg with knee straight. Slowly lower leg for 3-5 seconds. __10_ reps per set, _2__ sets, __3_ days per week. Lower like elevator, stopping at each floorRest on elbows. Rest on straight arms.  ABDUCTION: Side-Lying (Active)   Lie on left side, top leg straight. Raise top leg as far as possible.  Complete _2__ sets of _10__ repetitions.  http://gtsc.exer.us/94   (Home) Extension: Hip   With support under abdomen, tighten stomach. Lift right leg in line with body. Do not hyperextend. Alternate legs. Repeat __10__ times per set. Do _2___ sets per session. Do __3__ sessions per week.  ADDUCTION: Side-Lying (Active)   Lie on right side, with top leg bent and in front of other leg. Lift straight leg up as high as possible.  Complete _2__ sets of _10__ repetitions.   http://gtsc.exer.us/129   Copyright  VHI. All rights reserved.   Jeanie Sewer PTA [facility] [address], [place], [place] [address] Phone # [phone] Fax [phone]

## 2016-09-03 ENCOUNTER — Encounter: Payer: No Typology Code available for payment source | Admitting: Physical Therapy

## 2016-09-06 ENCOUNTER — Telehealth: Payer: Self-pay | Admitting: Internal Medicine

## 2016-09-06 NOTE — Telephone Encounter (Signed)
Thank you. Will evaluate on Friday

## 2016-09-06 NOTE — Telephone Encounter (Signed)
New Message  Pt voiced brother diagnoses with sleep apnea and pt wants to know if she has sleep apnea and voiced mother passed away in her sleep possibly due to sleep apnea  Pt voiced she went into atril flutter on Sunday morning during her sleep.  Pt voiced she got very cold where teeth where chattering.  Pt voiced it resolved within 5 minutes, BP went up and went back down.  Pt voiced last night she slept fine.  Please f/u with pt

## 2016-09-06 NOTE — Telephone Encounter (Signed)
Spoke with pt states that she is concerned that she has sleep apnea, she states that her brother has sleep apnea and that her mother died in her sleep-possibly from sleep apnea. Pt states that on Sunday she woke up out of sleep at 1am stating that her chest was fluttering and she thinks that she may have had panic attack, her BP was 156/89 pulse 126-regular rythm. Pt states that her brother is a Marine scientist and thinks that she just startled herself because this resolved she took 1/2 tab metoprolol and deep breathing exercises and all resolved in <10 minutes. Afterwards her chest was sore the rest of the day but it did resolve also, she states that she did also feel "a little drained" the rest of the day but has felt great since pt would like sleep apnea evaluated so I scheduled appt Friday-Mengand also follow up appt with Dr [name] in Jan.

## 2016-09-07 ENCOUNTER — Encounter: Payer: No Typology Code available for payment source | Admitting: Physical Therapy

## 2016-09-07 NOTE — Telephone Encounter (Signed)
Can't close encounter due to something incomplete.  Please close encounter

## 2016-09-10 ENCOUNTER — Ambulatory Visit (INDEPENDENT_AMBULATORY_CARE_PROVIDER_SITE_OTHER): Payer: No Typology Code available for payment source | Admitting: Physician Assistant

## 2016-09-10 ENCOUNTER — Encounter: Payer: Self-pay | Admitting: Physician Assistant

## 2016-09-10 VITALS — BP 116/62 | HR 68 | Ht 68.5 in | Wt 183.6 lb

## 2016-09-10 DIAGNOSIS — I480 Paroxysmal atrial fibrillation: Secondary | ICD-10-CM

## 2016-09-10 NOTE — Patient Instructions (Addendum)
Your physician recommends that you continue on your current medications as directed. Please refer to the Current Medication list given to you today.    Your physician recommends that you schedule a follow-up appointment in: [place]
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
## 2016-11-01 ENCOUNTER — Encounter: Payer: Self-pay | Admitting: Internal Medicine

## 2016-11-01 ENCOUNTER — Ambulatory Visit (INDEPENDENT_AMBULATORY_CARE_PROVIDER_SITE_OTHER): Payer: No Typology Code available for payment source | Admitting: Internal Medicine

## 2016-11-01 VITALS — BP 96/60 | HR 64 | Ht 68.5 in | Wt 182.8 lb

## 2016-11-01 DIAGNOSIS — I2510 Atherosclerotic heart disease of native coronary artery without angina pectoris: Secondary | ICD-10-CM

## 2016-11-01 DIAGNOSIS — I351 Nonrheumatic aortic (valve) insufficiency: Secondary | ICD-10-CM

## 2016-11-01 DIAGNOSIS — R7982 Elevated C-reactive protein (CRP): Secondary | ICD-10-CM

## 2016-11-01 DIAGNOSIS — I77810 Thoracic aortic ectasia: Secondary | ICD-10-CM

## 2016-11-01 DIAGNOSIS — I480 Paroxysmal atrial fibrillation: Secondary | ICD-10-CM

## 2016-11-01 DIAGNOSIS — Z79899 Other long term (current) drug therapy: Secondary | ICD-10-CM
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
## 2016-11-01 NOTE — Patient Instructions (Addendum)
Your physician has requested that you have an echocardiogram in 6 months. Echocardiography is a painless test that uses sound waves to create images of your heart. It provides your doctor with information about the size and shape of your heart and how well your heart's chambers and valves are working. This procedure takes approximately one hour. There are no restrictions for this procedure.  Your physician recommends that you return for lab work in: [place] (before your next appointment) - fasting   Your physician wants you to follow-up in: [place] with Dr. [name]. You will receive a reminder letter in the mail two months in advance. If you don't receive a letter, please call our office to schedule the follow-up appointment.

## 2016-11-12 MED FILL — METOPROLOL SUCC ER 25 MG TA: 25 | 30 days supply | Qty: 135 | Fill #1

## 2016-12-06 ENCOUNTER — Telehealth: Payer: Self-pay | Admitting: Family Medicine

## 2016-12-06 ENCOUNTER — Encounter: Payer: Self-pay | Admitting: Cardiovascular Disease

## 2016-12-06 NOTE — Telephone Encounter (Signed)
Returning your call. °

## 2016-12-06 NOTE — Telephone Encounter (Signed)
Patient states that she has received 2 bills for services provided on 05/24/2016 and 06/07/2016, however patient had the OC during that time and she states that she has brought these 2 bills to you before and has not heard anything. Please f/u.

## 2016-12-06 NOTE — Telephone Encounter (Signed)
This encounter was created in error - please disregard.

## 2016-12-10 NOTE — Telephone Encounter (Signed)
I relayed this message to the patient this afternoon.

## 2016-12-10 NOTE — Telephone Encounter (Signed)
I emailed [employer] in [place] billing asking her to review the account for any CAFA adjustments that need to be made.

## 2016-12-31 ENCOUNTER — Telehealth: Payer: Self-pay | Admitting: Family Medicine

## 2016-12-31 DIAGNOSIS — K0889 Other specified disorders of teeth and supporting structures: Secondary | ICD-10-CM

## 2016-12-31 MED ORDER — CHLORHEXIDINE GLUCONATE 0.12% ORAL RINSE (MEDLINE KIT): 15.0000 mL | Freq: Two times a day (BID) | OROMUCOSAL | 2 refills | Status: DC

## 2016-12-31 MED ORDER — AMOXICILLIN-POT CLAVULANATE 875-125 MG PO TABS: 1.0000 | ORAL_TABLET | Freq: Two times a day (BID) | ORAL | 0 refills | Status: DC

## 2016-12-31 NOTE — Telephone Encounter (Signed)
Please inform patient,  For mild to moderate dental pain due to missing crowns Chlorhexidine mouth wash was called  in to patient's pharmacy  Antibiotics are reserved for severe pain or pain due to abscess The choice of antibiotic is Augmentin. Is pain severe? Is there swelling or pus pocket to suggest abscess?  New dental referral was also placed

## 2016-12-31 NOTE — Telephone Encounter (Signed)
Augmentin sent in for tooth pain

## 2016-12-31 NOTE — Telephone Encounter (Signed)
Pt. Called to let her PCP know that she has dental problems and would like to know if her PCP can prescribe her an antibiotic for her right molar that does not have a crown. Please f/u

## 2016-12-31 NOTE — Telephone Encounter (Signed)
Pt called back she states she is having severe pain and needs antibiotics, she states she is having swelling in her jaw. Please f/up

## 2017-01-01 ENCOUNTER — Encounter (HOSPITAL_COMMUNITY): Payer: Self-pay | Admitting: *Deleted

## 2017-01-01 ENCOUNTER — Ambulatory Visit (HOSPITAL_COMMUNITY)
Admission: EM | Admit: 2017-01-01 | Discharge: 2017-01-01 | Disposition: A | Discharge status: Home / Self Care | Payer: No Typology Code available for payment source | Attending: Internal Medicine | Admitting: Internal Medicine

## 2017-01-01 DIAGNOSIS — Z98818 Other dental procedure status: Secondary | ICD-10-CM

## 2017-01-01 DIAGNOSIS — R58 Hemorrhage, not elsewhere classified: Secondary | ICD-10-CM

## 2017-01-01 NOTE — Discharge Instructions (Signed)
Apply cool water soaked tea bag on extraction site to help with clotting.  Apply pressure with guaze. If any problems call Dr. Mina Marble on his cell phone [phone]
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
## 2017-01-01 NOTE — ED Triage Notes (Signed)
Reports having 2 right lower teeth extracted @ 1600 today.  While driving, pt started having significant amount "blood pouring out of my mouth".  Has not called on-call dentist yet, just drove to nearest urgent care.

## 2017-01-10 ENCOUNTER — Ambulatory Visit: Payer: No Typology Code available for payment source | Attending: Family Medicine

## 2017-02-14 ENCOUNTER — Ambulatory Visit: Payer: No Typology Code available for payment source | Attending: Family Medicine

## 2017-03-09 ENCOUNTER — Encounter: Payer: Self-pay | Admitting: Family Medicine

## 2017-03-10 ENCOUNTER — Emergency Department (HOSPITAL_COMMUNITY)
Admission: EM | Admit: 2017-03-10 | Discharge: 2017-03-10 | Disposition: A | Discharge status: Home / Self Care | Payer: Self-pay | Attending: Emergency Medicine | Admitting: Emergency Medicine

## 2017-03-10 ENCOUNTER — Encounter (HOSPITAL_COMMUNITY): Payer: Self-pay | Admitting: Emergency Medicine

## 2017-03-10 ENCOUNTER — Telehealth: Payer: Self-pay | Admitting: Internal Medicine

## 2017-03-10 ENCOUNTER — Emergency Department (HOSPITAL_COMMUNITY): Payer: Self-pay

## 2017-03-10 DIAGNOSIS — R079 Chest pain, unspecified: Secondary | ICD-10-CM | POA: Insufficient documentation

## 2017-03-10 DIAGNOSIS — Z7982 Long term (current) use of aspirin: Secondary | ICD-10-CM | POA: Insufficient documentation

## 2017-03-10 DIAGNOSIS — Z79899 Other long term (current) drug therapy: Secondary | ICD-10-CM | POA: Insufficient documentation

## 2017-03-10 LAB — URINALYSIS, ROUTINE W REFLEX MICROSCOPIC
BILIRUBIN URINE: NEGATIVE
GLUCOSE, UA: NEGATIVE mg/dL
HGB URINE DIPSTICK: NEGATIVE
Ketones, ur: NEGATIVE mg/dL
Leukocytes, UA: NEGATIVE
Nitrite: NEGATIVE
Protein, ur: NEGATIVE mg/dL
SPECIFIC GRAVITY, URINE: 1.006 (ref 1.005–1.030)
pH: 6 (ref 5.0–8.0)

## 2017-03-10 LAB — BASIC METABOLIC PANEL
Anion gap: 10 (ref 5–15)
BUN: 16 mg/dL (ref 6–20)
CALCIUM: 9 mg/dL (ref 8.9–10.3)
CO2: 25 mmol/L (ref 22–32)
CREATININE: 0.64 mg/dL (ref 0.44–1.00)
Chloride: 102 mmol/L (ref 101–111)
GFR calc Af Amer: >60 mL/min (ref >60)
Glucose, Bld: 93 mg/dL (ref 65–99)
POTASSIUM: 3.9 mmol/L (ref 3.5–5.1)
SODIUM: 137 mmol/L (ref 135–145)

## 2017-03-10 LAB — CBC WITH DIFFERENTIAL/PLATELET
Basophils Absolute: 0 10*3/uL (ref 0.0–0.1)
Basophils Relative: 0 %
EOS ABS: 0.2 10*3/uL (ref 0.0–0.7)
EOS PCT: 2 %
HCT: 35.6 % — ABNORMAL LOW (ref 36.0–46.0)
HEMOGLOBIN: 11.9 g/dL — AB (ref 12.0–15.0)
LYMPHS ABS: 1.4 10*3/uL (ref 0.7–4.0)
LYMPHS PCT: 18 %
MCH: 30 pg (ref 26.0–34.0)
MCHC: 33.4 g/dL (ref 30.0–36.0)
MCV: 89.7 fL (ref 78.0–100.0)
MONOS PCT: 5 %
Monocytes Absolute: 0.4 10*3/uL (ref 0.1–1.0)
Neutro Abs: 5.9 10*3/uL (ref 1.7–7.7)
Neutrophils Relative %: 75 %
PLATELETS: 285 10*3/uL (ref 150–400)
RBC: 3.97 MIL/uL (ref 3.87–5.11)
RDW: 13.5 % (ref 11.5–15.5)
WBC: 7.9 10*3/uL (ref 4.0–10.5)

## 2017-03-10 LAB — D-DIMER, QUANTITATIVE: D-Dimer, Quant: 0.27 ug/mL-FEU (ref 0.00–0.50)

## 2017-03-10 LAB — I-STAT TROPONIN, ED
TROPONIN I, POC: 0 ng/mL (ref 0.00–0.08)
TROPONIN I, POC: 0 ng/mL (ref 0.00–0.08)

## 2017-03-10 NOTE — Telephone Encounter (Signed)
Acknowledged, see other telephone documentation - prior note was sent to Dr. [name] & patient was informed at that time.

## 2017-03-10 NOTE — Telephone Encounter (Signed)
Returned call to patient she stated ED Dr waiting on a call back from [place].Advised I will send message to Dr.Hilty.

## 2017-03-10 NOTE — Telephone Encounter (Signed)
Received a call from patient.She stated she has been having chest pain off and on for the past 2 weeks.She is having chest pain at present.Rates pain # 4.Advised she needs to go to [facility] ED.

## 2017-03-10 NOTE — Telephone Encounter (Signed)
New message    Pt in ER, waiting on Dr. [name] to call before she can be discharged. States her test results are negative.

## 2017-03-10 NOTE — ED Triage Notes (Signed)
Pt sts CP into back on left side starting yesterday; pt sts increased stress and anxiety recently

## 2017-03-10 NOTE — Telephone Encounter (Signed)
°  New Prob  Pt is currently in the ED. Was evaluated for chest discomfort. However, she states work up came back okay but states she was told she can not be discharged until Dr. [name] or someone from our group calls and releases her.

## 2017-03-10 NOTE — ED Notes (Signed)
Provider at bedside

## 2017-03-10 NOTE — ED Notes (Signed)
Got patient undress on the monitor patient is resting 

## 2017-03-10 NOTE — Telephone Encounter (Signed)
Pt c/o of Chest Pain: STAT if CP now or developed within 24 hours  1. Are you having CP right now? Yes   2. Are you experiencing any other symptoms (ex. SOB, nausea, vomiting, sweating)? Chest pressure  3. How long have you been experiencing CP? Since today   4. Is your CP continuous or coming and going? No   5. Have you taken Nitroglycerin? no ?

## 2017-03-10 NOTE — Telephone Encounter (Signed)
Thanks for notifying me.  Dr. H 
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
## 2017-03-10 NOTE — ED Notes (Signed)
Walked patient to the bathroom  

## 2017-03-10 NOTE — Discharge Instructions (Signed)
Follow-up with her primary care doctor next 24-48 hours as needed.  Follow-up with your cardiologist. Call their office tomorrow and arrange for an appointment on Monday or Tuesday. They are aware and will be expecting her call.  Return the emergency Department for any worsening chest pain, difficulty breathing, nausea/vomiting, numbness of her arms or legs, fever or any other worsening or concerning symptoms.  Marland Kitchen

## 2017-03-24 ENCOUNTER — Ambulatory Visit (HOSPITAL_COMMUNITY): Payer: No Typology Code available for payment source | Attending: Internal Medicine

## 2017-03-24 ENCOUNTER — Other Ambulatory Visit: Payer: Self-pay

## 2017-03-24 DIAGNOSIS — I77810 Thoracic aortic ectasia: Secondary | ICD-10-CM

## 2017-03-24 DIAGNOSIS — I4891 Unspecified atrial fibrillation: Secondary | ICD-10-CM | POA: Insufficient documentation

## 2017-03-24 DIAGNOSIS — I080 Rheumatic disorders of both mitral and aortic valves: Secondary | ICD-10-CM | POA: Insufficient documentation

## 2017-03-24 DIAGNOSIS — I351 Nonrheumatic aortic (valve) insufficiency: Secondary | ICD-10-CM

## 2017-03-29 ENCOUNTER — Other Ambulatory Visit (HOSPITAL_COMMUNITY): Payer: No Typology Code available for payment source

## 2017-03-31 ENCOUNTER — Telehealth: Payer: Self-pay | Admitting: Internal Medicine

## 2017-03-31 NOTE — Telephone Encounter (Signed)
New message  Pt verbalized that she is returning call for results

## 2017-03-31 NOTE — Telephone Encounter (Signed)
Notes recorded by Pixie Casino, MD on 03/25/2017 at 8:18 AM EDT Normal systolic function - aortic root size and mild aortic insufficiency is stable - no changes.  Dr. Lemmie Evens   Returned call to patient - provided results  She states she cannot access MyChart to view results She wanted to know her EF, aortic root size and she states technician told her that this was borderline aneurysm - wanted clarification on this, wants to know about exercise limitations if any -- deferred to Dr. [name] (patient is aware he is out of the office until next week)  Scheduled for 6 month visit 04/28/17

## 2017-04-04 NOTE — Telephone Encounter (Signed)
Yes - aneurysm is not "large" - no exercise limitations.  Dr. Lemmie Evens

## 2017-04-05 ENCOUNTER — Encounter: Payer: Self-pay | Admitting: *Deleted

## 2017-04-05 NOTE — Telephone Encounter (Signed)
MyChart message with MD advice sent to patient  

## 2017-04-28 ENCOUNTER — Ambulatory Visit: Payer: No Typology Code available for payment source | Admitting: Internal Medicine

## 2017-05-16 ENCOUNTER — Other Ambulatory Visit: Payer: Self-pay | Admitting: *Deleted

## 2017-05-16 DIAGNOSIS — I2510 Atherosclerotic heart disease of native coronary artery without angina pectoris: Secondary | ICD-10-CM

## 2017-05-16 DIAGNOSIS — R7982 Elevated C-reactive protein (CRP): Secondary | ICD-10-CM

## 2017-05-16 DIAGNOSIS — Z79899 Other long term (current) drug therapy: Secondary | ICD-10-CM

## 2017-05-16 LAB — COMPREHENSIVE METABOLIC PANEL
A/G RATIO: 1.5 (ref 1.2–2.2)
ALK PHOS: 76 IU/L (ref 39–117)
ALT: 20 IU/L (ref 0–32)
AST: 24 IU/L (ref 0–40)
Albumin: 4.4 g/dL (ref 3.5–5.5)
BUN/Creatinine Ratio: 25 — ABNORMAL HIGH (ref 9–23)
BUN: 16 mg/dL (ref 6–24)
Bilirubin Total: 0.5 mg/dL (ref 0.0–1.2)
CO2: 25 mmol/L (ref 20–29)
Calcium: 9.5 mg/dL (ref 8.7–10.2)
Chloride: 101 mmol/L (ref 96–106)
Creatinine, Ser: 0.65 mg/dL (ref 0.57–1.00)
GFR calc non Af Amer: 100 mL/min/{1.73_m2} (ref >59)
GFR, EST AFRICAN AMERICAN: 116 mL/min/{1.73_m2} (ref >59)
GLUCOSE: 87 mg/dL (ref 65–99)
Globulin, Total: 3 g/dL (ref 1.5–4.5)
POTASSIUM: 4.6 mmol/L (ref 3.5–5.2)
Sodium: 140 mmol/L (ref 134–144)
Total Protein: 7.4 g/dL (ref 6.0–8.5)

## 2017-05-16 LAB — LIPID PANEL
CHOLESTEROL TOTAL: 185 mg/dL (ref 100–199)
Chol/HDL Ratio: 2.6 ratio (ref 0.0–4.4)
HDL: 71 mg/dL (ref >39)
LDL Calculated: 101 mg/dL — ABNORMAL HIGH (ref 0–99)
TRIGLYCERIDES: 66 mg/dL (ref 0–149)
VLDL Cholesterol Cal: 13 mg/dL (ref 5–40)

## 2017-05-16 LAB — HIGH SENSITIVITY CRP: CRP HIGH SENSITIVITY: 1.92 mg/L (ref 0.00–3.00)

## 2017-05-16 LAB — C-REACTIVE PROTEIN: CRP: 1.9 mg/L (ref 0.0–4.9)

## 2017-05-17 ENCOUNTER — Ambulatory Visit (INDEPENDENT_AMBULATORY_CARE_PROVIDER_SITE_OTHER): Payer: No Typology Code available for payment source | Admitting: Internal Medicine

## 2017-05-17 ENCOUNTER — Encounter: Payer: Self-pay | Admitting: Internal Medicine

## 2017-05-17 VITALS — BP 102/74 | HR 70 | Ht 68.0 in | Wt 180.0 lb

## 2017-05-17 DIAGNOSIS — Z8249 Family history of ischemic heart disease and other diseases of the circulatory system: Secondary | ICD-10-CM

## 2017-05-17 DIAGNOSIS — I6522 Occlusion and stenosis of left carotid artery: Secondary | ICD-10-CM

## 2017-05-17 DIAGNOSIS — I77810 Thoracic aortic ectasia: Secondary | ICD-10-CM

## 2017-05-17 DIAGNOSIS — R7982 Elevated C-reactive protein (CRP): Secondary | ICD-10-CM

## 2017-05-17 NOTE — Patient Instructions (Addendum)
Medication Instructions:  Your physician recommends that you continue on your current medications as directed. Please refer to the Current Medication list given to you today.  Labwork: NONE  Testing/Procedures: Your physician has requested that you have a carotid duplex. This test is an ultrasound of the carotid arteries in your neck. It looks at blood flow through these arteries that supply the brain with blood. Allow one hour for this exam. There are no restrictions or special instructions.  CALCIUM SCORE [facility] [address]  Your physician has requested that you have an abdominal aorta duplex. During this test, an ultrasound is used to evaluate the aorta. Allow 30 minutes for this exam. Do not eat after midnight the day before and avoid carbonated beverages  Follow-Up: Your physician wants you to follow-up in: [place] will receive a reminder letter in the mail two months in advance. If you don't receive a letter, please call our office to schedule the follow-up appointment.  If you need a refill on your cardiac medications before your next appointment, please call your pharmacy.
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
## 2017-05-27 ENCOUNTER — Telehealth: Payer: Self-pay | Admitting: Internal Medicine

## 2017-05-27 NOTE — Telephone Encounter (Signed)
New Message    Pt call requesting to speak with RN about schedule Duplex on 8/15. Please call back to discuss

## 2017-05-27 NOTE — Telephone Encounter (Signed)
Returned call to patient.   She states she got a notice from [facility] screening nurse and they talked at length about her results (carotid doppler?) and was now told she did not have plaque in her RICA but there was mild endothelial thickening in the interlining of the artery wall. The nurse told her that the tech told patient incorrectly. She would like Dr. [name] to be aware of this. She still wishes to proceed with doppler in our office.   She reports [facility] [place] was going to check on coverage of her doppler studies - AAA & carotid and CT calcium score. Patient has orange card and wants to make sure she will not have to pay anything out of pocket for her testing.   Advised would have someone in billing give her a call.

## 2017-06-01 ENCOUNTER — Telehealth: Payer: Self-pay | Admitting: Internal Medicine

## 2017-06-01 NOTE — Telephone Encounter (Signed)
Called and leave message on pt voicemail about her CT Calcium Score appt schedule for 08/15 @ 2:00 pm

## 2017-06-01 NOTE — Telephone Encounter (Signed)
New message  Pt call requesting to speak with RN to discuss her upcoming procedures. Please call back to discuss

## 2017-06-01 NOTE — Telephone Encounter (Signed)
Spoke with pt giving her time and date of her upcoming AAA and Carotid doppler, pt was also asking about her CT Calcium score which I see was ordered by Dr [name] but was not schedule, I went and spoke to [facility] in scheduling she stated she will call pt back to schedule appt, informed her pt want appt on same day as the other two.

## 2017-06-07 ENCOUNTER — Ambulatory Visit: Payer: Self-pay | Attending: Internal Medicine | Admitting: Physician Assistant

## 2017-06-07 VITALS — BP 108/74 | HR 65 | Temp 98.1°F | Resp 16 | Ht 68.0 in | Wt 178.0 lb

## 2017-06-07 DIAGNOSIS — Z79899 Other long term (current) drug therapy: Secondary | ICD-10-CM | POA: Insufficient documentation

## 2017-06-07 DIAGNOSIS — M67442 Ganglion, left hand: Secondary | ICD-10-CM | POA: Insufficient documentation

## 2017-06-07 DIAGNOSIS — K219 Gastro-esophageal reflux disease without esophagitis: Secondary | ICD-10-CM | POA: Insufficient documentation

## 2017-06-07 DIAGNOSIS — Z7982 Long term (current) use of aspirin: Secondary | ICD-10-CM | POA: Insufficient documentation

## 2017-06-07 DIAGNOSIS — Z888 Allergy status to other drugs, medicaments and biological substances status: Secondary | ICD-10-CM | POA: Insufficient documentation

## 2017-06-07 DIAGNOSIS — K122 Cellulitis and abscess of mouth: Secondary | ICD-10-CM | POA: Insufficient documentation

## 2017-06-07 DIAGNOSIS — I480 Paroxysmal atrial fibrillation: Secondary | ICD-10-CM | POA: Insufficient documentation

## 2017-06-07 MED ORDER — PENICILLIN V POTASSIUM 500 MG PO TABS: 500.0000 mg | ORAL_TABLET | Freq: Three times a day (TID) | ORAL | 0 refills | Status: DC
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
## 2017-06-07 NOTE — Progress Notes (Signed)
Possible cyst in left hand x 1-2 weeks Concerns about dental pain

## 2017-06-08 ENCOUNTER — Ambulatory Visit (HOSPITAL_COMMUNITY): Admission: RE | Admit: 2017-06-08 | Payer: No Typology Code available for payment source | Source: Ambulatory Visit

## 2017-06-08 ENCOUNTER — Inpatient Hospital Stay: Admission: RE | Admit: 2017-06-08 | Payer: No Typology Code available for payment source | Source: Ambulatory Visit

## 2017-06-08 ENCOUNTER — Encounter (HOSPITAL_COMMUNITY): Payer: Self-pay

## 2017-06-08 ENCOUNTER — Ambulatory Visit (HOSPITAL_COMMUNITY)
Admission: RE | Admit: 2017-06-08 | Discharge: 2017-06-08 | Disposition: A | Discharge status: Home / Self Care | Payer: No Typology Code available for payment source | Source: Ambulatory Visit | Attending: Cardiovascular Disease | Admitting: Cardiovascular Disease

## 2017-06-08 ENCOUNTER — Telehealth: Payer: Self-pay | Admitting: Internal Medicine

## 2017-06-08 DIAGNOSIS — Z8249 Family history of ischemic heart disease and other diseases of the circulatory system: Secondary | ICD-10-CM

## 2017-06-08 DIAGNOSIS — I6523 Occlusion and stenosis of bilateral carotid arteries: Secondary | ICD-10-CM

## 2017-06-08 NOTE — Telephone Encounter (Signed)
Per 05/17/17 note: Finally, we could consider coronary artery calcium scoring. This may help further determine whether there is any evidence for ASCVD, again making an argument for statin therapy on top of diet and lifestyle interventions.  Will defer to MD to determine if test is necessary or optional per patient's choice

## 2017-06-08 NOTE — Telephone Encounter (Signed)
Copied below are lab results + carotid doppler results. Attempted to LM for patient - VM cut off  Notes recorded by Pixie Casino, MD on 06/08/2017 at 3:26 PM EDT Since the carotid is normal - she is essentially at her LDL goal of 100. If she proceeds with the coronary artery calcium score and it is >100, then I would recommend starting the Crestor.  Dr Lemmie Evens  Notes recorded by Pixie Casino, MD on 06/08/2017 at 12:25 PM EDT Normal carotid arteries. No detectable plaque. Good news!  Dr. Lemmie Evens

## 2017-06-08 NOTE — Telephone Encounter (Signed)
It's optional  - but if abnormal, I would be more aggressive with her cholesterol therapy. We offer this study for $125 out of pocket, I believe.  Dr. Lemmie Evens

## 2017-06-08 NOTE — Telephone Encounter (Signed)
Patient calling, states that she had a procedure completed this morning and the results came back normal. Patient states that she would like to verify if Dr. [name] would like her to proceed with CT Scoring test.

## 2017-06-09 NOTE — Telephone Encounter (Signed)
Contacted patient and provided carotid doppler results. Explained that MD has not recommended initiation of statin therapy at this particular time, however may be recommended based on coronary calcium results should she decide to proceed with this study.   Patient was ordered to have an AAA done 8/15 as well, but this test was cancelled - cancellation notes state reason: provider w/comments that per 11/2016 note [facility] screening was normal. Patient states she was also told by tech that the AAA would not assess her dilated aortic root and another test could be performed to look at this that costs $50 (vascuscreen?). Per last MD note, AAA was ordered as patient's father had aortic aneurysm. She would like MD opinion if she needs to have this study and per our discussion it appears she does in fact need test.   She is contemplating proceeding with coronary calcium scoring and will call if she wishes to schedule. Will this study capture her aortic root as well?

## 2017-06-09 NOTE — Telephone Encounter (Signed)
I would get the coronary calcium scan - this will show the aortic root, but not the abdominal aorta. Her abdominal aorta screening ultrasound was normal, it does not need to be repeated. Carotid dopplers were normal. Please reassure her - there is nothing concerning to worry about at this point.  Dr. Lemmie Evens

## 2017-06-09 NOTE — Telephone Encounter (Signed)
Left detailed message w/MD recommendations.

## 2017-06-15 ENCOUNTER — Ambulatory Visit (INDEPENDENT_AMBULATORY_CARE_PROVIDER_SITE_OTHER)
Admission: RE | Admit: 2017-06-15 | Discharge: 2017-06-15 | Disposition: A | Discharge status: Home / Self Care | Payer: Self-pay | Source: Ambulatory Visit | Attending: Internal Medicine | Admitting: Internal Medicine

## 2017-06-15 DIAGNOSIS — I6522 Occlusion and stenosis of left carotid artery: Secondary | ICD-10-CM

## 2017-06-16 ENCOUNTER — Telehealth: Payer: Self-pay | Admitting: Internal Medicine

## 2017-06-16 NOTE — Telephone Encounter (Signed)
Patient calling, would like to know if Calcium scoring test results are available?

## 2017-06-17 ENCOUNTER — Telehealth: Payer: Self-pay | Admitting: Internal Medicine

## 2017-06-17 NOTE — Telephone Encounter (Signed)
If she does not have s/s of active dental abscess antibiotics are not indicated. Will not refill antibiotics without office visit.

## 2017-06-17 NOTE — Telephone Encounter (Signed)
Pt called to find out if she still can get a refill for penicillin v potassium (VEETID) 500 MG tablet  Since her appt with the dental surgery is not for another month or so, she need to know what to do, please follow up

## 2017-06-17 NOTE — Telephone Encounter (Signed)
Patient does have an abscess and was provided this antibiotic to cover her until the dental appointment. The first available appointment was provided but is still a month away. Patient is requesting a refill, I will message Dr. Doreene Burke as well to see if he can assist.

## 2017-06-17 NOTE — Telephone Encounter (Signed)
Patient has appointment to establish care with you as her PCP. Patient has a dental appointment in another month and is requesting a refill on penicillin v potassium to cover her until the visit. Please advise.

## 2017-06-20 ENCOUNTER — Encounter (HOSPITAL_COMMUNITY): Payer: Self-pay | Admitting: Emergency Medicine

## 2017-06-20 ENCOUNTER — Ambulatory Visit (HOSPITAL_COMMUNITY)
Admission: EM | Admit: 2017-06-20 | Discharge: 2017-06-20 | Disposition: A | Discharge status: Home / Self Care | Payer: No Typology Code available for payment source | Attending: Emergency Medicine | Admitting: Emergency Medicine

## 2017-06-20 DIAGNOSIS — K029 Dental caries, unspecified: Secondary | ICD-10-CM

## 2017-06-20 DIAGNOSIS — J0100 Acute maxillary sinusitis, unspecified: Secondary | ICD-10-CM

## 2017-06-20 MED ORDER — AMOXICILLIN-POT CLAVULANATE 875-125 MG PO TABS: 1.0000 | ORAL_TABLET | Freq: Two times a day (BID) | ORAL | 0 refills | Status: DC

## 2017-06-20 NOTE — ED Provider Notes (Signed)
  [facility]   [id number] 06/20/17 Arrival Time: 4401   SUBJECTIVE:  [name] is a 56 y.o. female who presents to the urgent care with complaint of facial pain, and congestion ongoing for 1 month, previously had an abscessed tooth that was treated with penicillin, no nausea or vomiting, pain is worse when there is pressure to the face, no sensitivity to hot or cold foods  ROS: As per HPI, remainder of ROS negative.   OBJECTIVE:  Vitals:   06/20/17 1909  BP: 117/71  Pulse: 68  Resp: 16  Temp: 98.4 F (36.9 C)  TempSrc: Oral  SpO2: 100%     General appearance: alert; no distress HEENT: normocephalic; atraumatic; conjunctivae normal; Tenderness over the left maxillary sinus, does have poor dilatation with multiple dental caries noted. No erythema or bleeding to the gums. Neck: No cervical lymphadenopathy Lungs: clear to auscultation bilaterally Heart: regular rate and rhythm Abdomen: soft, non-tender; Musculoskeletal: Grossly symmetrical Skin: warm and dry Neurologic: Grossly normal Psychological:  alert and cooperative; normal mood and affect     ASSESSMENT & PLAN:  1. Dental caries   2. Acute non-recurrent maxillary sinusitis     Meds ordered this encounter  Medications  . amoxicillin-clavulanate (AUGMENTIN) 875-125 MG tablet    Sig: Take 1 tablet by mouth 2 (two) times daily.    Dispense:  14 tablet    Refill:  0    Order Specific Question:   Supervising Provider    Answer:   Vanessa Kick [[id number]]    Reviewed expectations re: course of current medical issues. Questions answered. Outlined signs and symptoms indicating need for more acute intervention. Patient verbalized understanding. After Visit Summary given.    Procedures:     Labs Reviewed - No data to display  No results found.  Allergies  Allergen Reactions  . Sausage [Pickled Meat] Other (See Comments)    Causes migraines  . Sulfamethoxazole Other (See Comments)      flushing    PMHx, SurgHx, SocialHx, Medications, and Allergies were reviewed in the Visit Navigator and updated as appropriate.       Barnet Glasgow, NP 06/20/17 2104

## 2017-06-20 NOTE — Discharge Instructions (Signed)
I have started on Augmentin, take one tablet twice a day for one week. Follow-up with dentist as needed.

## 2017-06-20 NOTE — ED Triage Notes (Signed)
The patient presented to the [facility] with a complaint of dental pain. The patient stated that she is on a wait list with an oral surgeon. The patient just finished a course of PCN.

## 2017-06-21 NOTE — Telephone Encounter (Signed)
We do not refill antibiotics. Patient will need to be reevaluated before prescribing another round of antibiotics

## 2017-06-22 NOTE — Telephone Encounter (Addendum)
Left message on voicemail to return call. Noted that patient went to Urgent Care on 06/20/2017.

## 2017-07-11 ENCOUNTER — Telehealth: Payer: Self-pay | Admitting: Internal Medicine

## 2017-07-11 NOTE — Telephone Encounter (Signed)
She does not need clearance for dental extraction. Ok to hold aspirin 7 days prior to the case if requested by her dentist to reduce bleeding risk. Restart 24-48 hours after.  Dr. Lemmie Evens

## 2017-07-11 NOTE — Telephone Encounter (Signed)
Returned call to patient.She stated she needs a tooth pulled.She goes to [facility] [place].She goes for xrays this Thurs 9/20.She wanted clearance from Dr.Hilty.Message sent to Dr.Hilty.

## 2017-07-11 NOTE — Telephone Encounter (Signed)
New message     Patient has a infect tooth that might have to be extraction soon . H/o Afib.    Advise patient to call the Dentist office and have them to send over a clearance  Patient advise she does not have dental insurance might have to pay to get tooth out.

## 2017-07-11 NOTE — Telephone Encounter (Signed)
Returned call to patient Dr.Hilty's advice given. 

## 2017-07-28 ENCOUNTER — Ambulatory Visit: Payer: Self-pay | Attending: Internal Medicine

## 2017-08-15 ENCOUNTER — Other Ambulatory Visit: Payer: Self-pay | Admitting: Internal Medicine

## 2017-08-15 MED FILL — METOPROLOL SUCC ER 25 MG TA: 25 | 30 days supply | Qty: 45 | Fill #0

## 2017-08-15 MED FILL — TRIAMCINOLONE 0.1% CREAM: 0.1 | 18 days supply | Qty: 75 | Fill #1

## 2017-08-15 NOTE — Telephone Encounter (Signed)
REFILL 

## 2017-09-07 ENCOUNTER — Ambulatory Visit: Payer: Self-pay | Admitting: Family Medicine

## 2017-09-19 ENCOUNTER — Encounter: Payer: Self-pay | Admitting: Nurse Practitioner

## 2017-09-19 ENCOUNTER — Ambulatory Visit: Payer: Self-pay | Attending: Family Medicine | Admitting: Nurse Practitioner

## 2017-09-19 VITALS — BP 96/63 | HR 71 | Temp 97.9°F | Resp 12 | Ht 68.0 in | Wt 184.6 lb

## 2017-09-19 DIAGNOSIS — L299 Pruritus, unspecified: Secondary | ICD-10-CM | POA: Insufficient documentation

## 2017-09-19 DIAGNOSIS — Z0000 Encounter for general adult medical examination without abnormal findings: Secondary | ICD-10-CM

## 2017-09-19 DIAGNOSIS — J309 Allergic rhinitis, unspecified: Secondary | ICD-10-CM | POA: Insufficient documentation

## 2017-09-19 DIAGNOSIS — I480 Paroxysmal atrial fibrillation: Secondary | ICD-10-CM | POA: Insufficient documentation

## 2017-09-19 DIAGNOSIS — Z79899 Other long term (current) drug therapy: Secondary | ICD-10-CM | POA: Insufficient documentation

## 2017-09-19 DIAGNOSIS — K219 Gastro-esophageal reflux disease without esophagitis: Secondary | ICD-10-CM | POA: Insufficient documentation

## 2017-09-19 DIAGNOSIS — Z7982 Long term (current) use of aspirin: Secondary | ICD-10-CM | POA: Insufficient documentation

## 2017-09-19 DIAGNOSIS — R21 Rash and other nonspecific skin eruption: Secondary | ICD-10-CM | POA: Insufficient documentation

## 2017-09-19 DIAGNOSIS — L282 Other prurigo: Secondary | ICD-10-CM

## 2017-09-19 DIAGNOSIS — L989 Disorder of the skin and subcutaneous tissue, unspecified: Secondary | ICD-10-CM | POA: Insufficient documentation
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
## 2017-09-20 LAB — CBC
Hematocrit: 37.4 % (ref 34.0–46.6)
Hemoglobin: 12.5 g/dL (ref 11.1–15.9)
MCH: 29.7 pg (ref 26.6–33.0)
MCHC: 33.4 g/dL (ref 31.5–35.7)
MCV: 89 fL (ref 79–97)
PLATELETS: 336 10*3/uL (ref 150–379)
RBC: 4.21 x10E6/uL (ref 3.77–5.28)
RDW: 13.4 % (ref 12.3–15.4)
WBC: 5.3 10*3/uL (ref 3.4–10.8)

## 2017-09-20 LAB — TSH: TSH: 2.73 u[IU]/mL (ref 0.450–4.500)

## 2017-09-20 LAB — BASIC METABOLIC PANEL
BUN/Creatinine Ratio: 28 — ABNORMAL HIGH (ref 9–23)
BUN: 16 mg/dL (ref 6–24)
CHLORIDE: 104 mmol/L (ref 96–106)
CO2: 25 mmol/L (ref 20–29)
Calcium: 9.4 mg/dL (ref 8.7–10.2)
Creatinine, Ser: 0.58 mg/dL (ref 0.57–1.00)
GFR calc non Af Amer: 104 mL/min/{1.73_m2} (ref >59)
GFR, EST AFRICAN AMERICAN: 120 mL/min/{1.73_m2} (ref >59)
GLUCOSE: 86 mg/dL (ref 65–99)
POTASSIUM: 4.5 mmol/L (ref 3.5–5.2)
SODIUM: 141 mmol/L (ref 134–144)

## 2017-09-20 LAB — LIPID PANEL
CHOLESTEROL TOTAL: 188 mg/dL (ref 100–199)
Chol/HDL Ratio: 2.4 ratio (ref 0.0–4.4)
HDL: 79 mg/dL (ref >39)
LDL Calculated: 94 mg/dL (ref 0–99)
Triglycerides: 74 mg/dL (ref 0–149)
VLDL Cholesterol Cal: 15 mg/dL (ref 5–40)

## 2017-09-29 ENCOUNTER — Telehealth: Payer: Self-pay | Admitting: Internal Medicine

## 2017-09-29 NOTE — Telephone Encounter (Signed)
Spoke with pt, [employer] wants to install a smart monitor in her home and it has RF waves through the home and they can affect your heart rate. Due to her atrial fib she does not want the meter and [employer] has a medical letter that if signed will prevent the RF waves from being turned on. She would like to know if dr hilty will sign the letter. Aware dr [name] not back in the office until Monday but will forward this note to him.

## 2017-09-29 NOTE — Telephone Encounter (Signed)
Left message for patient to bring the letter by to get signed.

## 2017-09-29 NOTE — Telephone Encounter (Signed)
Yes .. I can sign it next week.  Dr. Lemmie Evens

## 2017-09-29 NOTE — Telephone Encounter (Signed)
°  New Prob  Requesting to speak to RN regarding a letter she would like signed by Dr. [name]. Please call.

## 2017-09-30 NOTE — Telephone Encounter (Signed)
Patient came by the office and brought the [employer] paperwork to be signed. Placed in dr hilty's mailbox.

## 2017-10-06 ENCOUNTER — Telehealth: Payer: Self-pay | Admitting: Internal Medicine

## 2017-10-06 ENCOUNTER — Telehealth: Payer: Self-pay | Admitting: *Deleted

## 2017-10-06 NOTE — Telephone Encounter (Signed)
   Primary Cardiologist: No primary care provider on file.  Chart reviewed as part of pre-operative protocol coverage. Given past medical history and time since last visit, based on ACC/AHA guidelines, LESLEYANNE POLITTE would be at acceptable risk for the planned procedure (single dental extraction) without further cardiovascular testing. A single dental extraction can be completed without holding aspirin.   I will route this recommendation to the requesting party via Epic fax function and remove from pre-op pool.  Please call with questions.  Christell Faith, PA-C 10/06/2017, 3:40 PM

## 2017-10-06 NOTE — Telephone Encounter (Signed)
New Message     Did you get the form filled out that she dropped off last Friday ?  Did you get it notarized ? Medical Opt out form for [employer]

## 2017-10-06 NOTE — Telephone Encounter (Signed)
Patient aware that form is signed/notarized. She will come pick this up

## 2017-10-06 NOTE — Telephone Encounter (Signed)
There is no notary at our office. Forwarded to MD

## 2017-10-06 NOTE — Telephone Encounter (Signed)
I have located a notary in the office and signed the form.  Dr. Lemmie Evens

## 2017-10-06 NOTE — Telephone Encounter (Signed)
   [facility] Pre-operative Risk Assessment    Request for surgical clearance:  1. What type of surgery is being performed? SINGLE DENTAL EXTRACTION    2. When is this surgery scheduled? TBD   3. Are there any medications that need to be held prior to surgery and how long?ASA and any other if indicated  MEDICATION MANAGEMENT ONLY NEEDED   4. Practice name and name of physician performing surgery?[facility]   5. What is your office phone and fax number? [place]   6. Anesthesia type (None, local, MAC, general) ? IV SEDATION WITH FENTANYL AND VERSED

## 2017-10-07 ENCOUNTER — Encounter: Payer: Self-pay | Admitting: Nurse Practitioner

## 2017-10-07 ENCOUNTER — Ambulatory Visit: Payer: Self-pay | Attending: Nurse Practitioner | Admitting: Nurse Practitioner

## 2017-10-07 VITALS — BP 111/69 | HR 72 | Temp 98.9°F | Ht 68.0 in | Wt 184.6 lb

## 2017-10-07 DIAGNOSIS — Z79899 Other long term (current) drug therapy: Secondary | ICD-10-CM | POA: Insufficient documentation

## 2017-10-07 DIAGNOSIS — Z01419 Encounter for gynecological examination (general) (routine) without abnormal findings: Secondary | ICD-10-CM | POA: Insufficient documentation

## 2017-10-07 DIAGNOSIS — Z801 Family history of malignant neoplasm of trachea, bronchus and lung: Secondary | ICD-10-CM | POA: Insufficient documentation

## 2017-10-07 DIAGNOSIS — Z91018 Allergy to other foods: Secondary | ICD-10-CM | POA: Insufficient documentation

## 2017-10-07 DIAGNOSIS — R002 Palpitations: Secondary | ICD-10-CM | POA: Insufficient documentation

## 2017-10-07 DIAGNOSIS — I4891 Unspecified atrial fibrillation: Secondary | ICD-10-CM | POA: Insufficient documentation

## 2017-10-07 DIAGNOSIS — Z882 Allergy status to sulfonamides status: Secondary | ICD-10-CM | POA: Insufficient documentation

## 2017-10-07 DIAGNOSIS — Z1151 Encounter for screening for human papillomavirus (HPV): Secondary | ICD-10-CM | POA: Insufficient documentation

## 2017-10-07 DIAGNOSIS — K219 Gastro-esophageal reflux disease without esophagitis: Secondary | ICD-10-CM | POA: Insufficient documentation

## 2017-10-07 DIAGNOSIS — Z7982 Long term (current) use of aspirin: Secondary | ICD-10-CM | POA: Insufficient documentation

## 2017-10-07 DIAGNOSIS — F419 Anxiety disorder, unspecified: Secondary | ICD-10-CM | POA: Insufficient documentation

## 2017-10-07 DIAGNOSIS — Z8249 Family history of ischemic heart disease and other diseases of the circulatory system: Secondary | ICD-10-CM | POA: Insufficient documentation
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
## 2017-10-10 LAB — CYTOLOGY - PAP
Diagnosis: NEGATIVE
HPV (WINDOPATH): NOT DETECTED

## 2017-10-19 ENCOUNTER — Telehealth: Payer: Self-pay | Admitting: Nurse Practitioner

## 2017-10-19 NOTE — Telephone Encounter (Signed)
[facility] called stating that the pt. Was referred out for skin lesion and the notes that were sent over does not state any of her skin lesions. Please f/u

## 2017-10-20 NOTE — Telephone Encounter (Signed)
Good Afternoon  Can you please, forwarder to her pcp Thank you

## 2017-11-01 NOTE — Telephone Encounter (Signed)
Patient requested the dermatology referral. I was not able to identify any skin lesions.

## 2017-11-02 ENCOUNTER — Telehealth: Payer: Self-pay | Admitting: Internal Medicine

## 2017-11-02 NOTE — Telephone Encounter (Signed)
I do not see anything in the Kiana from 10/21/17. Please call requesting provider's office to confirm which clearance is needed if different from below.  Letter regarding procedure noted below will be provided for fax. Richardson Dopp, PA-C    11/02/2017 1:57 PM    The following was noted on 10/06/17:    [facility] Pre-operative Risk Assessment    Request for surgical clearance:  1. What type of surgery is being performed? SINGLE DENTAL EXTRACTION    2. When is this surgery scheduled? TBD   3. Are there any medications that need to be held prior to surgery and how long?ASA and any other if indicated  MEDICATION MANAGEMENT ONLY NEEDED   4. Practice name and name of physician performing surgery?[facility]   5. What is your office phone and fax number? [place]   6. Anesthesia type (None, local, MAC, general) ? IV SEDATION WITH FENTANYL AND VERSED    Reviewed by Christell Faith, PA-C 10/06/17 with the following recommendations:    Primary Cardiologist: No primary care provider on file.  Chart reviewed as part of pre-operative protocol coverage. Given past medical history and time since last visit, based on ACC/AHA guidelines, JERENE YEAGER would be at acceptable risk for the planned procedure (single dental extraction) without further cardiovascular testing. A single dental extraction can be completed without holding aspirin.   I will route this recommendation to the requesting party via Epic fax function and remove from pre-op pool.  Please call with questions.  Christell Faith, PA-C 10/06/2017, 3:40 PM

## 2017-11-02 NOTE — Telephone Encounter (Signed)
I called Dr. Dorian Heckle office [facility]'s . I confirmed with Tammy at Dr. Dorian Heckle office pt is at this time having a single dental extraction. Will fax clearance letter to [phone].

## 2017-11-02 NOTE — Telephone Encounter (Signed)
[name] is checking to find out the status of the clearance she faxed on 10-21-17.She needs this back asap-please fax to [phone] OVP:CHEKBT.

## 2017-11-03 ENCOUNTER — Telehealth: Payer: Self-pay

## 2017-11-03 NOTE — Telephone Encounter (Signed)
[name] ( Dr. Buelah Manis ) office is calling because she has some specific questions about  The clearance , she leaves at noon today . If you dont reach her today , Please call in the morning .  Phone number is [phone]  Thanks

## 2017-11-03 NOTE — Telephone Encounter (Signed)
   [facility] Pre-operative Risk Assessment    Request for surgical clearance:  1. What type of surgery is being performed? Single tooth extraction  2. When is this surgery scheduled? Pending  3. Are there any medications that need to be held prior to surgery and how long? Aspirin  4. Practice name and name of physician performing surgery? [facility]  5. What is your office phone and fax number? Phone # [phone]   Fax 3 [phone]  6. Anesthesia type (None, local, MAC, general) ? IV sedation Fentanyl and Versed   Kathyrn Lass 11/03/2017, 10:03 AM  _________________________________________________________________   (provider comments below)

## 2017-11-04 NOTE — Telephone Encounter (Signed)
Dental office calling back again regarding surgical clearance.  The patient has a prior CT calcium score of 0.  She has a history of paroxysmal atrial fibrillation with low thromboembolic risk ([id number]).  Dental office contacted again today and discussed with Elmyra Ricks.  The request today is for whether or not the patient may receive light sedation for her dental extraction.  Another letter will be sent to the dental office regarding clearance.  The patient is at low risk and may receive light sedation, she can hold aspirin if needed and needs no further testing.  Please contact office of referring provider to ensure that fax was received today.  Richardson Dopp, PA-C    11/04/2017 11:31 AM

## 2017-11-09 ENCOUNTER — Other Ambulatory Visit: Payer: Self-pay | Admitting: Nurse Practitioner

## 2017-11-09 DIAGNOSIS — L282 Other prurigo: Secondary | ICD-10-CM

## 2017-11-09 MED ORDER — TRIAMCINOLONE ACETONIDE 0.1 % EX CREA: 1.0000 "application " | TOPICAL_CREAM | Freq: Two times a day (BID) | CUTANEOUS | 1 refills | Status: DC

## 2017-11-09 MED FILL — TRIAMCINOLONE ACETONIDE 0.1: 0.1 | 30 days supply | Qty: 80 | Fill #0

## 2017-11-09 MED FILL — METOPROLOL SUCC ER 25 MG TA: 25 | 30 days supply | Qty: 45 | Fill #1

## 2017-11-09 NOTE — Telephone Encounter (Signed)
Letter sent with clearance info-see in letters

## 2017-11-22 ENCOUNTER — Encounter: Payer: Self-pay | Admitting: Internal Medicine

## 2017-11-22 ENCOUNTER — Ambulatory Visit: Payer: Self-pay | Admitting: Internal Medicine

## 2017-11-22 VITALS — BP 118/72 | HR 66 | Ht 68.5 in | Wt 186.0 lb

## 2017-11-22 DIAGNOSIS — I2510 Atherosclerotic heart disease of native coronary artery without angina pectoris: Secondary | ICD-10-CM

## 2017-11-22 DIAGNOSIS — I77810 Thoracic aortic ectasia: Secondary | ICD-10-CM

## 2017-11-22 DIAGNOSIS — I351 Nonrheumatic aortic (valve) insufficiency: Secondary | ICD-10-CM
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
## 2017-11-22 NOTE — Patient Instructions (Signed)
Your physician wants you to follow-up in: ONE YEAR with Dr. Hilty. You will receive a reminder letter in the mail two months in advance. If you don't receive a letter, please call our office to schedule the follow-up appointment.  

## 2017-11-23 ENCOUNTER — Encounter: Payer: Self-pay | Admitting: Internal Medicine

## 2018-01-25 ENCOUNTER — Ambulatory Visit: Payer: Self-pay | Attending: Nurse Practitioner

## 2018-03-15 MED FILL — METOPROLOL SUCCINATE ER 25: 25 | 30 days supply | Qty: 45 | Fill #2

## 2018-03-16 ENCOUNTER — Telehealth: Payer: Self-pay | Admitting: Internal Medicine

## 2018-03-16 NOTE — Telephone Encounter (Signed)
New Message   Patient is calling in reference to her my chart records. She wants to know the actual date that she was diagnosed with dilated aortic root and the Afib. Please call.

## 2018-03-16 NOTE — Telephone Encounter (Signed)
Returned call to patient-patient states she is trying to apply for disability and would like to know the dates she was diagnosed with Afib and a dilated aortic root.   Advised per chart review, she was first noted to be in afib at ER visit 01/09/2014 and the dilated aortic root was first noted on an echo in 2016.   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    She would like to know if Dr. [name] would sign her disability forms and agree with her getting disability.    Routed to MD for review.

## 2018-03-16 NOTE — Telephone Encounter (Signed)
Her diagnosis would not qualify for disability.  Dr Lemmie Evens

## 2018-03-17 NOTE — Telephone Encounter (Signed)
Left message to call back  

## 2018-03-17 NOTE — Telephone Encounter (Signed)
Follow up    Patient calling to discuss echo information on MyChart

## 2018-03-17 NOTE — Telephone Encounter (Signed)
New message    Pt is returning call to [facility]. She said she got disconnected

## 2018-03-17 NOTE — Telephone Encounter (Signed)
Pt states she was told by Social [employer] that her Afib and dilated Aortic Root classifies her for disability. She states that in order for her to get it the DX has to be before 10/24/14. Pt was informed that the echo result on 3/15 shows it was normal but pt is requesting that Dr. [name] re-look at the results again to see if it show dilated aortic root. Pt also wanted to ask him if this is something she could have been born with. Routing to Dr. [name].

## 2018-03-22 NOTE — Telephone Encounter (Signed)
Return call to pt and updated of ECHO results on 01/16/14. Verbalized understanding.

## 2018-03-22 NOTE — Telephone Encounter (Signed)
The aortic root measured normal on 01/16/2014. That is the earliest study that I have other than a chest CT in 2007 which also showed a normal sized aorta.  Dr. [name]

## 2018-04-19 ENCOUNTER — Ambulatory Visit: Payer: Self-pay | Attending: Nurse Practitioner | Admitting: Nurse Practitioner

## 2018-04-19 ENCOUNTER — Encounter

## 2018-04-19 ENCOUNTER — Encounter: Payer: Self-pay | Admitting: Nurse Practitioner

## 2018-04-19 VITALS — BP 113/77 | HR 64 | Temp 98.1°F | Ht 68.0 in | Wt 186.8 lb

## 2018-04-19 DIAGNOSIS — Z9889 Other specified postprocedural states: Secondary | ICD-10-CM | POA: Insufficient documentation

## 2018-04-19 DIAGNOSIS — K219 Gastro-esophageal reflux disease without esophagitis: Secondary | ICD-10-CM | POA: Insufficient documentation

## 2018-04-19 DIAGNOSIS — M722 Plantar fascial fibromatosis: Secondary | ICD-10-CM

## 2018-04-19 DIAGNOSIS — M159 Polyosteoarthritis, unspecified: Secondary | ICD-10-CM

## 2018-04-19 DIAGNOSIS — R635 Abnormal weight gain: Secondary | ICD-10-CM

## 2018-04-19 DIAGNOSIS — Z882 Allergy status to sulfonamides status: Secondary | ICD-10-CM | POA: Insufficient documentation

## 2018-04-19 DIAGNOSIS — Z79899 Other long term (current) drug therapy: Secondary | ICD-10-CM | POA: Insufficient documentation

## 2018-04-19 DIAGNOSIS — M549 Dorsalgia, unspecified: Secondary | ICD-10-CM | POA: Insufficient documentation

## 2018-04-19 DIAGNOSIS — M25549 Pain in joints of unspecified hand: Secondary | ICD-10-CM | POA: Insufficient documentation

## 2018-04-19 DIAGNOSIS — Z91018 Allergy to other foods: Secondary | ICD-10-CM | POA: Insufficient documentation

## 2018-04-19 DIAGNOSIS — M1991 Primary osteoarthritis, unspecified site: Secondary | ICD-10-CM | POA: Insufficient documentation

## 2018-04-19 DIAGNOSIS — Z7982 Long term (current) use of aspirin: Secondary | ICD-10-CM | POA: Insufficient documentation

## 2018-04-19 DIAGNOSIS — I480 Paroxysmal atrial fibrillation: Secondary | ICD-10-CM

## 2018-04-19 DIAGNOSIS — M150 Primary generalized (osteo)arthritis: Secondary | ICD-10-CM

## 2018-04-19 DIAGNOSIS — Z6828 Body mass index (BMI) 28.0-28.9, adult: Secondary | ICD-10-CM | POA: Insufficient documentation

## 2018-04-19 NOTE — Patient Instructions (Signed)
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
## 2018-04-20 LAB — T4, FREE: Free T4: 0.91 ng/dL (ref 0.82–1.77)

## 2018-04-20 LAB — T3: T3, Total: 122 ng/dL (ref 71–180)

## 2018-04-20 LAB — TSH: TSH: 2.58 u[IU]/mL (ref 0.450–4.500)

## 2018-04-20 LAB — RHEUMATOID FACTOR: Rhuematoid fact SerPl-aCnc: <10 IU/mL (ref 0.0–13.9)

## 2018-05-29 ENCOUNTER — Ambulatory Visit: Payer: Self-pay

## 2018-06-07 ENCOUNTER — Ambulatory Visit: Payer: Self-pay | Attending: Family Medicine

## 2018-06-23 MED FILL — METOPROLOL SUCCINATE ER 25: 25 | 30 days supply | Qty: 45 | Fill #3

## 2018-07-12 ENCOUNTER — Ambulatory Visit: Payer: Self-pay | Attending: Family Medicine | Admitting: Physician Assistant

## 2018-07-12 ENCOUNTER — Other Ambulatory Visit: Payer: Self-pay

## 2018-07-12 VITALS — BP 103/70 | HR 79 | Temp 98.1°F | Resp 16 | Wt 179.0 lb

## 2018-07-12 DIAGNOSIS — Z7982 Long term (current) use of aspirin: Secondary | ICD-10-CM | POA: Insufficient documentation

## 2018-07-12 DIAGNOSIS — G43909 Migraine, unspecified, not intractable, without status migrainosus: Secondary | ICD-10-CM | POA: Insufficient documentation

## 2018-07-12 DIAGNOSIS — Z79899 Other long term (current) drug therapy: Secondary | ICD-10-CM | POA: Insufficient documentation

## 2018-07-12 DIAGNOSIS — M546 Pain in thoracic spine: Secondary | ICD-10-CM | POA: Insufficient documentation

## 2018-07-12 DIAGNOSIS — Z23 Encounter for immunization: Secondary | ICD-10-CM | POA: Insufficient documentation

## 2018-07-12 DIAGNOSIS — M62838 Other muscle spasm: Secondary | ICD-10-CM | POA: Insufficient documentation

## 2018-07-12 DIAGNOSIS — M549 Dorsalgia, unspecified: Secondary | ICD-10-CM

## 2018-07-12 DIAGNOSIS — I4891 Unspecified atrial fibrillation: Secondary | ICD-10-CM | POA: Insufficient documentation

## 2018-07-12 DIAGNOSIS — K219 Gastro-esophageal reflux disease without esophagitis: Secondary | ICD-10-CM | POA: Insufficient documentation

## 2018-07-12 MED ORDER — IBUPROFEN 800 MG PO TABS: 800.0000 mg | ORAL_TABLET | Freq: Three times a day (TID) | ORAL | 0 refills | Status: DC

## 2018-07-12 MED ORDER — METAXALONE 800 MG PO TABS: 800.0000 mg | ORAL_TABLET | Freq: Three times a day (TID) | ORAL | 0 refills | Status: DC

## 2018-07-12 MED FILL — IBUPROFEN 800 MG TABLET: 800 | 10 days supply | Qty: 30 | Fill #0

## 2018-07-12 NOTE — Patient Instructions (Signed)
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
## 2018-07-13 ENCOUNTER — Other Ambulatory Visit: Payer: Self-pay | Admitting: Physician Assistant

## 2018-07-13 ENCOUNTER — Telehealth: Payer: Self-pay | Admitting: Nurse Practitioner

## 2018-07-13 ENCOUNTER — Telehealth: Payer: Self-pay | Admitting: Internal Medicine

## 2018-07-13 DIAGNOSIS — M62838 Other muscle spasm: Secondary | ICD-10-CM

## 2018-07-13 MED ORDER — METHOCARBAMOL 500 MG PO TABS: 1000.0000 mg | ORAL_TABLET | Freq: Three times a day (TID) | ORAL | 0 refills | Status: DC

## 2018-07-13 MED FILL — METHOCARBAMOL 500 MG TABS: 500 | 15 days supply | Qty: 90 | Fill #0

## 2018-07-13 NOTE — Telephone Encounter (Signed)
Patient called in to ask what other muscle relaxer she could take that this medication was to expensive. I advised her that whoever prescribed that medication should look into other alternatives and then should call back and advised if any issues with Afib. Patient verbalized understanding.  Will call PCP office.

## 2018-07-13 NOTE — Telephone Encounter (Signed)
Patient called to check on the medication substitution for the muscle relaxer. Please follow up with patient.

## 2018-07-13 NOTE — Telephone Encounter (Signed)
No drug- drug interaction with her current cardia medication, nor contraindication for Afib.  Okay to take medication as prescribed (noted therapy only for 7 days)

## 2018-07-13 NOTE — Telephone Encounter (Signed)
New Message    Pt c/o medication issue:  1. Name of Medication: Chauvin  2. How are you currently taking this medication (dosage and times per day)? 800MG    3. Are you having a reaction (difficulty breathing--STAT)?   4. What is your medication issue? Patient is wanting to be sure that she can take this medication (musle relaxer) with her having afib. Please call to discuss.

## 2018-07-13 NOTE — Telephone Encounter (Signed)
Please advise. Thanks.  

## 2018-07-13 NOTE — Telephone Encounter (Signed)
CMA spoke to the provider patient saw for her visit. Provider Sand Pillow sent another Rx for patient to take due to the medication that was given before was expensive.  Patient was inform new medication was sent for her.

## 2018-07-24 ENCOUNTER — Telehealth: Payer: Self-pay | Admitting: Nurse Practitioner

## 2018-07-24 ENCOUNTER — Other Ambulatory Visit: Payer: Self-pay | Admitting: Nurse Practitioner

## 2018-07-24 DIAGNOSIS — M25512 Pain in left shoulder: Secondary | ICD-10-CM

## 2018-07-24 DIAGNOSIS — M542 Cervicalgia: Secondary | ICD-10-CM

## 2018-07-24 NOTE — Telephone Encounter (Signed)
Pt called to request x-rays be ordered due to her back pain which she was seen for on 07/12/2018. She has not felt better and would like to have x-rays done before her follow up appt.  please follow up

## 2018-07-24 NOTE — Telephone Encounter (Signed)
Xray has been ordered. She can go to [facility]

## 2018-07-25 ENCOUNTER — Other Ambulatory Visit: Payer: Self-pay

## 2018-07-25 ENCOUNTER — Ambulatory Visit (HOSPITAL_COMMUNITY)
Admission: RE | Admit: 2018-07-25 | Discharge: 2018-07-25 | Disposition: A | Discharge status: Home / Self Care | Payer: Self-pay | Source: Ambulatory Visit | Attending: Nurse Practitioner | Admitting: Nurse Practitioner

## 2018-07-25 ENCOUNTER — Ambulatory Visit: Payer: Self-pay | Attending: Physician Assistant

## 2018-07-25 DIAGNOSIS — M546 Pain in thoracic spine: Secondary | ICD-10-CM

## 2018-07-25 DIAGNOSIS — M542 Cervicalgia: Secondary | ICD-10-CM | POA: Insufficient documentation

## 2018-07-25 DIAGNOSIS — M47892 Other spondylosis, cervical region: Secondary | ICD-10-CM | POA: Insufficient documentation

## 2018-07-25 DIAGNOSIS — R252 Cramp and spasm: Secondary | ICD-10-CM

## 2018-07-25 DIAGNOSIS — M25512 Pain in left shoulder: Secondary | ICD-10-CM | POA: Insufficient documentation

## 2018-07-25 DIAGNOSIS — M24812 Other specific joint derangements of left shoulder, not elsewhere classified: Secondary | ICD-10-CM | POA: Insufficient documentation

## 2018-07-25 DIAGNOSIS — M6281 Muscle weakness (generalized): Secondary | ICD-10-CM | POA: Insufficient documentation

## 2018-07-25 DIAGNOSIS — M79622 Pain in left upper arm: Secondary | ICD-10-CM
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
## 2018-07-25 NOTE — Patient Instructions (Signed)
Access Code: [email or web address]  URL: [email or web address]  Date: 07/25/2018  Prepared by: Sigurd Sos   Exercises  Cat-Camel - 10 reps - 1 sets - 5 hold - 2x daily - 7x weekly  Child's Pose Stretch - 3 reps - 20 hold - 2x daily - 7x weekly  Child's Pose with Sidebending - 3 reps - 20 hold - 2x daily - 7x weekly  Sidelying Open Book Thoracic Rotation with Knee on Foam Roll - 10 reps - 1 sets - 2x daily - 7x weekly  Neck Sidebending - 3 reps - 1 sets - 20 hold - 1x daily - 7x weekly

## 2018-07-25 NOTE — Telephone Encounter (Signed)
Pt was informed and understood.

## 2018-07-27 NOTE — Telephone Encounter (Signed)
Pt called to request her X-ray results, call forwarded to Carilyn Goodpasture to disclose

## 2018-07-27 NOTE — Telephone Encounter (Signed)
Pt name and DOB verified. Informed of results. Wishes to have further testing d/t the sudden onset of pain. She request to have Ultrasound of shoulder and medication that would take care of pain: NSAID That will not affect her A-fib.  Please advise.

## 2018-07-27 NOTE — Telephone Encounter (Signed)
Please ask patient if she would like a referral to orthopedics/sports medicine for her left shoulder pain. Please also ask if she is having any pain in her neck or pain radiating from her neck to the shoulder or from the shoulder to the arm or upper back. Ask if she would like a short course of a steroid/prednsione by mouth to help with her current pain.

## 2018-07-28 ENCOUNTER — Other Ambulatory Visit: Payer: Self-pay | Admitting: Family Medicine

## 2018-07-28 DIAGNOSIS — M542 Cervicalgia: Secondary | ICD-10-CM

## 2018-07-28 DIAGNOSIS — M62830 Muscle spasm of back: Secondary | ICD-10-CM

## 2018-07-28 MED ORDER — PREDNISONE 20 MG PO TABS: ORAL_TABLET | ORAL | 1 refills | Status: DC

## 2018-07-28 NOTE — Progress Notes (Signed)
Patient ID: [name], female   DOB: 1960/11/12, 57 y.o.   MRN: [id number]  Patient contacted the office secondary to complaint of continued pain in her left upper back and neck.  Patient had onset of pain after lifting flats of water bottles and it has now been about 4 weeks since she had the initial onset of pain.  Patient was initially prescribed ibuprofen and Skelaxin but these have not relieved her pain.  Prescription for prednisone taper has been sent into patient's pharmacy as well as referral for orthopedics per patient request.

## 2018-07-28 NOTE — Telephone Encounter (Signed)
Pt states she is having pain, feels like all a bit of all 3.  She would agrees to prednisone if it does not contraindicate with A-Fib. Please send to [facility] pharmacy.  Please place referral to ortho specialist.

## 2018-07-31 NOTE — Telephone Encounter (Signed)
Prednisone was sent to the pharmacy and Orthopedic referral placed on Friday. Please follow-up with patient to let her know that these were done

## 2018-08-03 ENCOUNTER — Ambulatory Visit (INDEPENDENT_AMBULATORY_CARE_PROVIDER_SITE_OTHER): Payer: Self-pay | Admitting: Surgery

## 2018-08-03 ENCOUNTER — Ambulatory Visit: Payer: Self-pay

## 2018-08-03 DIAGNOSIS — M546 Pain in thoracic spine: Secondary | ICD-10-CM

## 2018-08-03 DIAGNOSIS — M79622 Pain in left upper arm: Secondary | ICD-10-CM

## 2018-08-03 DIAGNOSIS — R252 Cramp and spasm: Secondary | ICD-10-CM
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
## 2018-08-03 NOTE — Telephone Encounter (Signed)
Pt was aware that medication was at pharmacy. She stated that her orange card expired on 07/27/2018. Advised to come to walk-in for orange card application on Monday or Friday. Verbalized understanding.

## 2018-08-03 NOTE — Patient Instructions (Addendum)

## 2018-08-08 ENCOUNTER — Ambulatory Visit: Payer: Self-pay

## 2018-08-08 DIAGNOSIS — M546 Pain in thoracic spine: Secondary | ICD-10-CM

## 2018-08-08 DIAGNOSIS — R252 Cramp and spasm: Secondary | ICD-10-CM

## 2018-08-08 DIAGNOSIS — M79622 Pain in left upper arm: Secondary | ICD-10-CM
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
## 2018-08-10 ENCOUNTER — Encounter: Payer: Self-pay | Admitting: Physical Therapy

## 2018-08-10 ENCOUNTER — Ambulatory Visit: Payer: Self-pay | Admitting: Physical Therapy

## 2018-08-10 DIAGNOSIS — M79622 Pain in left upper arm: Secondary | ICD-10-CM

## 2018-08-10 DIAGNOSIS — R252 Cramp and spasm: Secondary | ICD-10-CM

## 2018-08-10 DIAGNOSIS — M546 Pain in thoracic spine: Secondary | ICD-10-CM
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
## 2018-08-10 NOTE — Patient Instructions (Signed)
Access Code: [email or web address]  URL: [email or web address]  Date: 08/10/2018  Prepared by: Sherol Dade   Exercises  Cat-Camel - 10 reps - 1 sets - 5 hold - 2x daily - 7x weekly  Child's Pose Stretch - 3 reps - 20 hold - 2x daily - 7x weekly  Child's Pose with Sidebending - 3 reps - 20 hold - 2x daily - 7x weekly  Sidelying Open Book Thoracic Rotation with Knee on Foam Roll - 10 reps - 1 sets - 2x daily - 7x weekly  Neck Sidebending - 3 reps - 1 sets - 20 hold - 1x daily - 7x weekly  Supine Shoulder Horizontal Abduction with Resistance - 10-15 reps - 1 sets - 2x daily - 7x weekly  Seated Shoulder Rolls - 15 reps - 2 sets - 1x daily - 7x weekly    [facility] [address] [place], [place] [address] Phone # [phone] Fax [phone]

## 2018-08-14 ENCOUNTER — Ambulatory Visit: Payer: Self-pay

## 2018-08-14 DIAGNOSIS — M546 Pain in thoracic spine: Secondary | ICD-10-CM

## 2018-08-14 DIAGNOSIS — R252 Cramp and spasm: Secondary | ICD-10-CM

## 2018-08-14 DIAGNOSIS — M79622 Pain in left upper arm: Secondary | ICD-10-CM
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
## 2018-08-15 ENCOUNTER — Ambulatory Visit: Payer: Self-pay | Attending: Nurse Practitioner

## 2018-08-16 ENCOUNTER — Ambulatory Visit: Payer: Self-pay

## 2018-08-16 DIAGNOSIS — R252 Cramp and spasm: Secondary | ICD-10-CM

## 2018-08-16 DIAGNOSIS — M546 Pain in thoracic spine: Secondary | ICD-10-CM

## 2018-08-16 DIAGNOSIS — M79622 Pain in left upper arm: Secondary | ICD-10-CM
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
## 2018-08-17 ENCOUNTER — Other Ambulatory Visit: Payer: Self-pay | Admitting: Physician Assistant

## 2018-08-17 ENCOUNTER — Ambulatory Visit (INDEPENDENT_AMBULATORY_CARE_PROVIDER_SITE_OTHER): Payer: Self-pay | Admitting: Surgery

## 2018-08-17 ENCOUNTER — Encounter (INDEPENDENT_AMBULATORY_CARE_PROVIDER_SITE_OTHER): Payer: Self-pay | Admitting: Surgery

## 2018-08-17 ENCOUNTER — Telehealth: Payer: Self-pay | Admitting: Internal Medicine

## 2018-08-17 DIAGNOSIS — M62830 Muscle spasm of back: Secondary | ICD-10-CM

## 2018-08-17 DIAGNOSIS — M542 Cervicalgia: Secondary | ICD-10-CM

## 2018-08-17 DIAGNOSIS — M4722 Other spondylosis with radiculopathy, cervical region: Secondary | ICD-10-CM

## 2018-08-17 MED ORDER — PREDNISONE 20 MG PO TABS: ORAL_TABLET | ORAL | 1 refills | Status: DC

## 2018-08-17 MED FILL — predniSONE 20 MG TABS: 20 | 5 days supply | Qty: 5 | Fill #0

## 2018-08-17 NOTE — Telephone Encounter (Signed)
New Message;   Patient calling having some concerns about some medication that the orthoptic prescribed.

## 2018-08-17 NOTE — Telephone Encounter (Signed)
Pt called to report that she is having meds added by the orthopedic MD for neck spasms to help keep her out of surgery... Methocarbamol and prednisone.. They told her to call Cardio first to see if it is safe with her h/o Afib.Marland Kitchen Pt reports that she is fine taking it since she prefers not to have surgery and denies any recent problems with palpitations or exacerbation of symptoms from  Afib.. Advised her that I will froward to Dr. [name] and the Pharmacist and call her back with their response.

## 2018-08-17 NOTE — Telephone Encounter (Signed)
LMTCB
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
## 2018-08-17 NOTE — Telephone Encounter (Signed)
Noted prednisone and methocarbamol Rx are for short term use.  Okay to take as prescibed. NO significant interactions with other medication or disease expected.  Patient may noticed slight increase in blood pressure and/or blood sugar but this swill resolve after completing 7 days of therapy.

## 2018-08-21 NOTE — Telephone Encounter (Signed)
Patient sent a message in [place] with [facility] advice concerning medications

## 2018-08-22 ENCOUNTER — Encounter: Payer: Self-pay | Admitting: Nurse Practitioner

## 2018-08-22 ENCOUNTER — Ambulatory Visit: Payer: Self-pay | Attending: Nurse Practitioner | Admitting: Nurse Practitioner

## 2018-08-22 VITALS — BP 123/78 | HR 68 | Temp 98.3°F | Ht 68.0 in | Wt 179.8 lb

## 2018-08-22 DIAGNOSIS — K0889 Other specified disorders of teeth and supporting structures: Secondary | ICD-10-CM | POA: Insufficient documentation

## 2018-08-22 DIAGNOSIS — M542 Cervicalgia: Secondary | ICD-10-CM | POA: Insufficient documentation

## 2018-08-22 DIAGNOSIS — Z7982 Long term (current) use of aspirin: Secondary | ICD-10-CM | POA: Insufficient documentation

## 2018-08-22 DIAGNOSIS — Z8249 Family history of ischemic heart disease and other diseases of the circulatory system: Secondary | ICD-10-CM | POA: Insufficient documentation

## 2018-08-22 DIAGNOSIS — K219 Gastro-esophageal reflux disease without esophagitis: Secondary | ICD-10-CM | POA: Insufficient documentation

## 2018-08-22 DIAGNOSIS — I4891 Unspecified atrial fibrillation: Secondary | ICD-10-CM | POA: Insufficient documentation

## 2018-08-22 DIAGNOSIS — M25512 Pain in left shoulder: Secondary | ICD-10-CM | POA: Insufficient documentation

## 2018-08-22 DIAGNOSIS — Z79899 Other long term (current) drug therapy: Secondary | ICD-10-CM | POA: Insufficient documentation

## 2018-08-22 DIAGNOSIS — G8929 Other chronic pain: Secondary | ICD-10-CM | POA: Insufficient documentation

## 2018-08-22 MED ORDER — METHOCARBAMOL 750 MG PO TABS: 750.0000 mg | ORAL_TABLET | Freq: Three times a day (TID) | ORAL | 0 refills | Status: AC
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
## 2018-08-23 ENCOUNTER — Encounter: Payer: Self-pay | Admitting: Nurse Practitioner

## 2018-08-23 ENCOUNTER — Ambulatory Visit: Payer: Self-pay

## 2018-08-23 DIAGNOSIS — M6281 Muscle weakness (generalized): Secondary | ICD-10-CM

## 2018-08-23 DIAGNOSIS — M79622 Pain in left upper arm: Secondary | ICD-10-CM

## 2018-08-23 DIAGNOSIS — M542 Cervicalgia: Secondary | ICD-10-CM

## 2018-08-23 DIAGNOSIS — R252 Cramp and spasm: Secondary | ICD-10-CM

## 2018-08-23 DIAGNOSIS — M546 Pain in thoracic spine: Secondary | ICD-10-CM
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
## 2018-08-24 ENCOUNTER — Ambulatory Visit: Payer: Self-pay

## 2018-08-24 DIAGNOSIS — M6281 Muscle weakness (generalized): Secondary | ICD-10-CM

## 2018-08-24 DIAGNOSIS — M542 Cervicalgia: Secondary | ICD-10-CM

## 2018-08-24 DIAGNOSIS — M79622 Pain in left upper arm: Secondary | ICD-10-CM

## 2018-08-24 DIAGNOSIS — R252 Cramp and spasm: Secondary | ICD-10-CM

## 2018-08-24 DIAGNOSIS — M546 Pain in thoracic spine: Secondary | ICD-10-CM
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
## 2018-08-28 ENCOUNTER — Ambulatory Visit: Payer: Self-pay | Attending: Physician Assistant

## 2018-08-28 DIAGNOSIS — M79622 Pain in left upper arm: Secondary | ICD-10-CM | POA: Insufficient documentation

## 2018-08-28 DIAGNOSIS — M6281 Muscle weakness (generalized): Secondary | ICD-10-CM | POA: Insufficient documentation

## 2018-08-28 DIAGNOSIS — M542 Cervicalgia: Secondary | ICD-10-CM | POA: Insufficient documentation

## 2018-08-28 DIAGNOSIS — M546 Pain in thoracic spine: Secondary | ICD-10-CM | POA: Insufficient documentation

## 2018-08-28 DIAGNOSIS — R252 Cramp and spasm: Secondary | ICD-10-CM | POA: Insufficient documentation

## 2018-08-28 NOTE — Patient Instructions (Signed)
Access Code: [email or web address]  URL: [email or web address]  Date: 08/28/2018  Prepared by: Sigurd Sos   Exercises  Shoulder extension with resistance - Neutral - 5-8 reps - 2 sets - 1x daily - 7x weekly  Standing Bilateral Low Shoulder Row with Anchored Resistance - 5-8 reps - 2 sets - 2x daily - 7x weekly
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
## 2018-08-30 ENCOUNTER — Ambulatory Visit: Payer: Self-pay

## 2018-08-30 DIAGNOSIS — M542 Cervicalgia: Secondary | ICD-10-CM

## 2018-08-30 DIAGNOSIS — M79622 Pain in left upper arm: Secondary | ICD-10-CM

## 2018-08-30 DIAGNOSIS — R252 Cramp and spasm: Secondary | ICD-10-CM

## 2018-08-30 DIAGNOSIS — M546 Pain in thoracic spine: Secondary | ICD-10-CM
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
## 2018-09-04 ENCOUNTER — Ambulatory Visit: Payer: Self-pay

## 2018-09-04 DIAGNOSIS — M79622 Pain in left upper arm: Secondary | ICD-10-CM

## 2018-09-04 DIAGNOSIS — R252 Cramp and spasm: Secondary | ICD-10-CM

## 2018-09-04 DIAGNOSIS — M542 Cervicalgia: Secondary | ICD-10-CM

## 2018-09-04 DIAGNOSIS — M546 Pain in thoracic spine: Secondary | ICD-10-CM

## 2018-09-04 DIAGNOSIS — M6281 Muscle weakness (generalized): Secondary | ICD-10-CM
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
## 2018-09-06 ENCOUNTER — Ambulatory Visit: Payer: Self-pay

## 2018-09-06 DIAGNOSIS — R252 Cramp and spasm: Secondary | ICD-10-CM

## 2018-09-06 DIAGNOSIS — M542 Cervicalgia: Secondary | ICD-10-CM

## 2018-09-06 DIAGNOSIS — M546 Pain in thoracic spine: Secondary | ICD-10-CM

## 2018-09-06 DIAGNOSIS — M79622 Pain in left upper arm: Secondary | ICD-10-CM
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
## 2018-09-07 ENCOUNTER — Encounter (INDEPENDENT_AMBULATORY_CARE_PROVIDER_SITE_OTHER): Payer: Self-pay | Admitting: Surgery

## 2018-09-07 ENCOUNTER — Ambulatory Visit (INDEPENDENT_AMBULATORY_CARE_PROVIDER_SITE_OTHER): Payer: Self-pay | Admitting: Surgery

## 2018-09-07 ENCOUNTER — Telehealth (INDEPENDENT_AMBULATORY_CARE_PROVIDER_SITE_OTHER): Payer: Self-pay | Admitting: Specialist

## 2018-09-07 DIAGNOSIS — M4722 Other spondylosis with radiculopathy, cervical region: Secondary | ICD-10-CM

## 2018-09-07 DIAGNOSIS — M542 Cervicalgia: Secondary | ICD-10-CM

## 2018-09-07 NOTE — Telephone Encounter (Signed)
I put her on the cancellation list 
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
## 2018-09-07 NOTE — Telephone Encounter (Signed)
Patient was seen by [name] today and need a 3 week return office visit. Patient is scheduled in December. Can you please add patient to wait list?

## 2018-09-08 ENCOUNTER — Other Ambulatory Visit: Payer: Self-pay | Admitting: Internal Medicine

## 2018-09-08 MED FILL — METOPROLOL SUCCINATE ER 25: 25 | 30 days supply | Qty: 45 | Fill #0

## 2018-09-12 ENCOUNTER — Telehealth (INDEPENDENT_AMBULATORY_CARE_PROVIDER_SITE_OTHER): Payer: Self-pay | Admitting: Surgery

## 2018-09-12 NOTE — Telephone Encounter (Signed)
Patient called advised she spoke with the imaging department and was told she can also get her Lt shoulder done as well. Patient said she is covered at 100% through [facility].  Patient said she was told [name] would need to change his order to include her left shoulder. Patient is having the MRI 09/16/18 at 11:00am.

## 2018-09-12 NOTE — Telephone Encounter (Signed)
The number to contact patient is [phone]

## 2018-09-12 NOTE — Telephone Encounter (Signed)
Patient called advised she spoke with the imaging department and was told she can also get her Lt shoulder done as well. Patient said she is covered at 100% through [facility].  Patient said she was told [name] would need to change his order to include her left shoulder. Patient is having the MRI 09/16/18 at 11:00am.------Please advise

## 2018-09-14 ENCOUNTER — Telehealth: Payer: Self-pay | Admitting: Nurse Practitioner

## 2018-09-14 ENCOUNTER — Telehealth (INDEPENDENT_AMBULATORY_CARE_PROVIDER_SITE_OTHER): Payer: Self-pay | Admitting: *Deleted

## 2018-09-14 NOTE — Telephone Encounter (Signed)
Patient called to inform she would like to get referred to a different practice for orthopedic because she is unhappy with the way she has been treated at [facility] orthopedic.

## 2018-09-14 NOTE — Telephone Encounter (Signed)
Patient is aware of this ----see message from [facility]

## 2018-09-14 NOTE — Telephone Encounter (Signed)
Patient would like to get referred to [facility] and would like to see jeffree jenkins or another orthopedic that can be recommended. Please follow up.

## 2018-09-14 NOTE — Telephone Encounter (Signed)
I explained to patient that I do not think the left shoulder MRI is clinically indicated at this point.  I advised that I believe her left shoulder pain is secondary to cervical spine pathology.  When she returns for review of her cervical MRI with Dr. Lorin Mercy he can repeat exam and see if he thinks shoulder MRI scan is needed at that point in time.

## 2018-09-14 NOTE — Telephone Encounter (Signed)
Pt called wanting to know if Benjiman Core would order MRI of L shoulder for her along with her Cervical spine so she can get those done together d/t being very claustrophobic, says she spoke with someone in imaging scheduling and they told her that she get at same time but order would have to be placed for shoulder. Pt states she has been hurting in the shoulder region also more then neck. Pt says she called 2 days ago and has not heard anything from anyone as to weather he will place order. I advised pt no order has been placed yet and that [name] has the message and will answer his messages. I told pt that Benjiman Core was here today and I will go upstairs to talk to him and give her call back  I sw Benjiman Core and he stated: I explained to patient that I do not think the left shoulder MRI is clinically indicated at this point.  I advised that I believe her left shoulder pain is secondary to cervical spine pathology.  When she returns for review of her cervical MRI with Dr. Lorin Mercy he can repeat exam and see if he thinks shoulder MRI scan is needed at that point in time.  I informed pt of the above message and pt got irate and stated she did not understand why he will not order MRI shoulder and that this was her body not his and she knows if she is hurting and needing the exam. Pt states she did not appreciate how [name] contacted her Physical Therapist to speak with her about her pain and told the therapist to hold pt therapy for now. Pt says that she was about 60% better with her therapy and [name] was not happy about that and wanted 80% better and she has only been to therapy twice. Pt was not happy because I told her that [name] wanted her to follow up with Dr. Lorin Mercy to go over her MRI cervical and she said she thought she was suppose to follow up with Louanne Skye, she does not want to or like Dr. Lorin Mercy and would prefer [place]. I informed pt that Dr. Lorin Mercy was a misread and that in R[place]an Hockey notes he wanted her to follow  up with [facility] and that is who we will schedule her with.    Pt is requesting a call back from Benjiman Core so that he can tell her him self why she can not get the MRI shoulder. I advised pt I will let him know and he would try to give her a call at his convenience. Pt states she does not care when as long as she gets a call from him personally.

## 2018-09-14 NOTE — Telephone Encounter (Signed)
Patient came in because she had lab work done 04/19/2018.Patient brought in the bills for the service date and would like to know if she had CAFA during that time period. Please follow up with patient.

## 2018-09-15 ENCOUNTER — Telehealth: Payer: Self-pay | Admitting: Nurse Practitioner

## 2018-09-15 NOTE — Telephone Encounter (Signed)
Patient called wanted to inform [name] that she will be canceling her MRI since she couldn't have her neck and shoulder done at the same time. Please call patient to advise

## 2018-09-15 NOTE — Telephone Encounter (Signed)
1) Medication(s) Requested (by name): prednisone 2) Pharmacy of Choice:  [facility]

## 2018-09-15 NOTE — Telephone Encounter (Signed)
Pt was inform that the CAFA will not cover for this bill since it was done before the CAFA was active

## 2018-09-15 NOTE — Telephone Encounter (Signed)
See below

## 2018-09-15 NOTE — Telephone Encounter (Signed)
Pt had refills at pharmacy, it will be ready for pick up on Monday.

## 2018-09-15 NOTE — Telephone Encounter (Signed)
Will route to PCP 

## 2018-09-16 ENCOUNTER — Ambulatory Visit (HOSPITAL_COMMUNITY)
Admission: RE | Admit: 2018-09-16 | Discharge: 2018-09-16 | Disposition: A | Discharge status: Home / Self Care | Payer: Self-pay | Source: Ambulatory Visit | Attending: Surgery | Admitting: Surgery

## 2018-09-16 DIAGNOSIS — M4322 Fusion of spine, cervical region: Secondary | ICD-10-CM | POA: Insufficient documentation

## 2018-09-16 DIAGNOSIS — M542 Cervicalgia: Secondary | ICD-10-CM

## 2018-09-16 DIAGNOSIS — M419 Scoliosis, unspecified: Secondary | ICD-10-CM | POA: Insufficient documentation

## 2018-09-16 DIAGNOSIS — M50321 Other cervical disc degeneration at C4-C5 level: Secondary | ICD-10-CM | POA: Insufficient documentation

## 2018-09-17 ENCOUNTER — Other Ambulatory Visit: Payer: Self-pay | Admitting: Nurse Practitioner

## 2018-09-17 DIAGNOSIS — M542 Cervicalgia: Secondary | ICD-10-CM

## 2018-09-17 DIAGNOSIS — M549 Dorsalgia, unspecified: Principal | ICD-10-CM

## 2018-09-17 DIAGNOSIS — G8929 Other chronic pain: Secondary | ICD-10-CM

## 2018-09-19 ENCOUNTER — Ambulatory Visit: Payer: Self-pay

## 2018-09-19 ENCOUNTER — Telehealth (INDEPENDENT_AMBULATORY_CARE_PROVIDER_SITE_OTHER): Payer: Self-pay | Admitting: Specialist

## 2018-09-19 NOTE — Telephone Encounter (Signed)
Patient called wanting to know if Dr. Louanne Skye would be willing to give her brief details of what was seen on her MRI.  She is scheduled with Dr. Louanne Skye on December 26th, but does not want to wait that long.  CB#[phone].  Thank you.

## 2018-09-20 NOTE — Telephone Encounter (Signed)
Looks like she was going for muscle spasms, thoracic and left upper extremity pain

## 2018-09-20 NOTE — Telephone Encounter (Signed)
I spoke with Dr. Louanne Skye, he states she has chronic neck problems and does believe that she aggrevated it when she lifted up on the person, and he will go over study at her next visit with him.

## 2018-09-20 NOTE — Telephone Encounter (Signed)
Why would you do therapy if you had no pain?

## 2018-09-20 NOTE — Telephone Encounter (Signed)
I called and advised her of what Dr. Louanne Skye said. She states that she did not have any neck pain till she did the traction at PT that Benjiman Core, PA-C ordered for.  She states that [name] tried to cancel PT and she said it was helping her, so she has started back doing PT. I did advise her that she is on the cancellation list to be contacted if someone cancelled.

## 2018-09-26 ENCOUNTER — Ambulatory Visit: Payer: No Typology Code available for payment source | Attending: Physician Assistant

## 2018-09-26 ENCOUNTER — Telehealth: Payer: Self-pay

## 2018-09-26 DIAGNOSIS — R252 Cramp and spasm: Secondary | ICD-10-CM | POA: Insufficient documentation

## 2018-09-26 DIAGNOSIS — M542 Cervicalgia: Secondary | ICD-10-CM | POA: Insufficient documentation

## 2018-09-26 DIAGNOSIS — M79622 Pain in left upper arm: Secondary | ICD-10-CM | POA: Insufficient documentation

## 2018-09-26 DIAGNOSIS — M546 Pain in thoracic spine: Secondary | ICD-10-CM | POA: Insufficient documentation

## 2018-09-26 DIAGNOSIS — M6281 Muscle weakness (generalized): Secondary | ICD-10-CM | POA: Insufficient documentation

## 2018-09-26 NOTE — Telephone Encounter (Signed)
PT called pt due to missed appointment and she thought her appointment was 09/28/18.  She confirmed next appointment.

## 2018-09-28 ENCOUNTER — Ambulatory Visit: Payer: No Typology Code available for payment source

## 2018-09-28 DIAGNOSIS — M6281 Muscle weakness (generalized): Secondary | ICD-10-CM

## 2018-09-28 DIAGNOSIS — M542 Cervicalgia: Secondary | ICD-10-CM

## 2018-09-28 DIAGNOSIS — R252 Cramp and spasm: Secondary | ICD-10-CM

## 2018-09-28 DIAGNOSIS — M546 Pain in thoracic spine: Secondary | ICD-10-CM

## 2018-09-28 DIAGNOSIS — M79622 Pain in left upper arm: Secondary | ICD-10-CM
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
## 2018-10-02 ENCOUNTER — Ambulatory Visit (INDEPENDENT_AMBULATORY_CARE_PROVIDER_SITE_OTHER): Payer: Self-pay | Admitting: Specialist

## 2018-10-02 ENCOUNTER — Telehealth: Payer: Self-pay | Admitting: Internal Medicine

## 2018-10-02 ENCOUNTER — Telehealth (INDEPENDENT_AMBULATORY_CARE_PROVIDER_SITE_OTHER): Payer: Self-pay | Admitting: Specialist

## 2018-10-02 ENCOUNTER — Encounter (INDEPENDENT_AMBULATORY_CARE_PROVIDER_SITE_OTHER): Payer: Self-pay | Admitting: Specialist

## 2018-10-02 VITALS — BP 116/70 | HR 67 | Ht 68.0 in | Wt 177.0 lb

## 2018-10-02 DIAGNOSIS — M25512 Pain in left shoulder: Secondary | ICD-10-CM

## 2018-10-02 DIAGNOSIS — M4722 Other spondylosis with radiculopathy, cervical region: Secondary | ICD-10-CM

## 2018-10-02 DIAGNOSIS — M5010 Cervical disc disorder with radiculopathy, unspecified cervical region: Secondary | ICD-10-CM

## 2018-10-02 DIAGNOSIS — S43112S Subluxation of left acromioclavicular joint, sequela: Secondary | ICD-10-CM

## 2018-10-02 MED ORDER — METHYLPREDNISOLONE 4 MG PO TBPK: ORAL_TABLET | ORAL | 0 refills | Status: DC

## 2018-10-02 MED ORDER — ALPRAZOLAM 0.5 MG PO TABS: 0.5000 mg | ORAL_TABLET | Freq: Every evening | ORAL | 0 refills | Status: DC | PRN

## 2018-10-02 MED FILL — ?METHYLPREDNISOLONE 4 MG TA: 4 | 6 days supply | Qty: 21 | Fill #0

## 2018-10-02 NOTE — Telephone Encounter (Signed)
Kane is calling from Dr. Louanne Skye office requesting to speak with a nurse. Connected to triage  Patient is currently at the office.

## 2018-10-02 NOTE — Telephone Encounter (Signed)
Please see message attached

## 2018-10-02 NOTE — Telephone Encounter (Signed)
Thanks Ivin Booty!  Dr. Lemmie Evens
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
## 2018-10-02 NOTE — Telephone Encounter (Signed)
Spoke to Dr Flavia Shipper -patient is in the office now , being evaluated -  Having neck , shoulder pain--  Having a little musculoskeletal pain left shoulder.  Per Dr Teressa Senter , he does not think ortho issue - but Dr Flavia Shipper would like patient to be evaluated and possible have testing moved up   aortic issue .per   appointment schedule for tomorrow with extender . ( see Dr Teressa Senter note from today .)

## 2018-10-02 NOTE — Telephone Encounter (Signed)
Noted and corrected it in referral

## 2018-10-02 NOTE — Patient Instructions (Signed)
Avoid overhead lifting and overhead use of the arms. Do not lift greater than 10 lbs. Tylenol ES one every 6-8 hours for pain and inflamation.  Discontinue with PT for now and we will resume when the results of the MRI are available. Marland Kitchen

## 2018-10-02 NOTE — Telephone Encounter (Signed)
Patient came back to the office stating that she would like to have her MRI at [facility] and not at [facility].  CB#[phone].  Thank you.
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
## 2018-10-03 ENCOUNTER — Encounter: Payer: Self-pay | Admitting: Adult Health

## 2018-10-03 ENCOUNTER — Ambulatory Visit (INDEPENDENT_AMBULATORY_CARE_PROVIDER_SITE_OTHER): Payer: No Typology Code available for payment source | Admitting: Adult Health

## 2018-10-03 VITALS — BP 100/58 | HR 81 | Ht 68.0 in | Wt 184.2 lb

## 2018-10-03 DIAGNOSIS — Z79899 Other long term (current) drug therapy: Secondary | ICD-10-CM

## 2018-10-03 DIAGNOSIS — R6884 Jaw pain: Secondary | ICD-10-CM

## 2018-10-03 DIAGNOSIS — I77810 Thoracic aortic ectasia: Secondary | ICD-10-CM

## 2018-10-03 DIAGNOSIS — I341 Nonrheumatic mitral (valve) prolapse: Secondary | ICD-10-CM

## 2018-10-03 DIAGNOSIS — R0789 Other chest pain: Secondary | ICD-10-CM

## 2018-10-03 NOTE — Patient Instructions (Signed)
Medication Instructions:  NO CHANGES- Your physician recommends that you continue on your current medications as directed. Please refer to the Current Medication list given to you today.  If you need a refill on your cardiac medications before your next appointment, please call your pharmacy.  Labwork: BMET AND MAG HERE IN OUR OFFICE AT [facility] Take the provided lab slips with you to the lab for your blood draw.   If you have labs (blood work) drawn today and your tests are completely normal, you will receive your results only by: Marland Kitchen MyChart Message (if you have MyChart) OR . A paper copy in the mail If you have any lab test that is abnormal or we need to change your treatment, we will call you to review the results.  Testing/Procedures: Echocardiogram - Your physician has requested that you have an echocardiogram. Echocardiography is a painless test that uses sound waves to create images of your heart. It provides your doctor with information about the size and shape of your heart and how well your heart's chambers and valves are working. This procedure takes approximately one hour. There are no restrictions for this procedure. This will be performed at our [facility] location - [address], [address].  Your physician has requested that you have an Exercise Myoview. A cardiac stress test is a cardiological test that measures the heart's ability to respond to external stress in a controlled clinical environment. The stress response is induced by exercise (exercise-treadmill). For further information please visit [facility]uge[facility]iest[facility]tn. If you have questions or concerns about your appointment, you can call the Nuclear Lab at [phone].  Hold: METOPROLOL THE NIGHT BEFORE TESTING    Follow-Up: You will need a follow up appointment in [place].    At [facility], you and your health needs are our priority.  As part of our continuing mission to provide you with exceptional heart  care, we have created designated Provider Care Teams.  These Care Teams include your primary Cardiologist (physician) and Advanced Practice Providers (APPs -  Physician Assistants and Nurse Practitioners) who all work together to provide you with the care you need, when you need it.

## 2018-10-04 LAB — BASIC METABOLIC PANEL
BUN/Creatinine Ratio: 29 — ABNORMAL HIGH (ref 9–23)
BUN: 18 mg/dL (ref 6–24)
CALCIUM: 9 mg/dL (ref 8.7–10.2)
CHLORIDE: 102 mmol/L (ref 96–106)
CO2: 23 mmol/L (ref 20–29)
CREATININE: 0.63 mg/dL (ref 0.57–1.00)
GFR, EST AFRICAN AMERICAN: 116 mL/min/{1.73_m2} (ref >59)
GFR, EST NON AFRICAN AMERICAN: 101 mL/min/{1.73_m2} (ref >59)
Glucose: 82 mg/dL (ref 65–99)
Potassium: 4.4 mmol/L (ref 3.5–5.2)
Sodium: 140 mmol/L (ref 134–144)

## 2018-10-05 ENCOUNTER — Telehealth (INDEPENDENT_AMBULATORY_CARE_PROVIDER_SITE_OTHER): Payer: Self-pay | Admitting: Specialist

## 2018-10-05 ENCOUNTER — Other Ambulatory Visit: Payer: Self-pay

## 2018-10-05 ENCOUNTER — Telehealth (HOSPITAL_COMMUNITY): Payer: Self-pay

## 2018-10-05 ENCOUNTER — Ambulatory Visit (HOSPITAL_COMMUNITY): Payer: No Typology Code available for payment source | Attending: Cardiovascular Disease

## 2018-10-05 DIAGNOSIS — I341 Nonrheumatic mitral (valve) prolapse: Secondary | ICD-10-CM

## 2018-10-05 DIAGNOSIS — I77810 Thoracic aortic ectasia: Secondary | ICD-10-CM

## 2018-10-05 NOTE — Telephone Encounter (Signed)
Patient called and stated that Dr. Laverta Baltimore has her going for MRI @ [facility] [place] and was told that a RX would be written for her for something to relax her during this MRI.  Please call patient to advise. [phone]

## 2018-10-05 NOTE — Telephone Encounter (Signed)
Encounter complete. 

## 2018-10-05 NOTE — Telephone Encounter (Signed)
Patient called and stated that Dr. Laverta Baltimore has her going for MRI @ [facility] and was told that a RX would be written for her for something to relax her during this MRI.------Please advise, she is sched for 10/08/18 for MRI

## 2018-10-06 MED ORDER — LORAZEPAM 0.5 MG PO TABS: ORAL_TABLET | ORAL | 0 refills | Status: DC

## 2018-10-06 NOTE — Telephone Encounter (Signed)
I called Ativan (Lorazepam) to [facility], I called and lmom to advise the patient

## 2018-10-06 NOTE — Telephone Encounter (Signed)
Ok to give ativan 0.5mg  one hour before mri and take another immediately before if needed.  #2 tablets no refill.

## 2018-10-08 ENCOUNTER — Ambulatory Visit (HOSPITAL_COMMUNITY)
Admission: RE | Admit: 2018-10-08 | Discharge: 2018-10-08 | Disposition: A | Discharge status: Home / Self Care | Payer: No Typology Code available for payment source | Source: Ambulatory Visit | Attending: Specialist | Admitting: Specialist

## 2018-10-08 DIAGNOSIS — S43112S Subluxation of left acromioclavicular joint, sequela: Secondary | ICD-10-CM

## 2018-10-08 DIAGNOSIS — M779 Enthesopathy, unspecified: Secondary | ICD-10-CM | POA: Insufficient documentation

## 2018-10-08 DIAGNOSIS — M5010 Cervical disc disorder with radiculopathy, unspecified cervical region: Secondary | ICD-10-CM

## 2018-10-08 DIAGNOSIS — M4722 Other spondylosis with radiculopathy, cervical region: Secondary | ICD-10-CM | POA: Insufficient documentation

## 2018-10-08 DIAGNOSIS — M19012 Primary osteoarthritis, left shoulder: Secondary | ICD-10-CM | POA: Insufficient documentation

## 2018-10-09 ENCOUNTER — Telehealth (INDEPENDENT_AMBULATORY_CARE_PROVIDER_SITE_OTHER): Payer: Self-pay | Admitting: *Deleted

## 2018-10-09 MED ORDER — LORAZEPAM 0.5 MG PO TABS: ORAL_TABLET | ORAL | 0 refills | Status: DC

## 2018-10-09 NOTE — Telephone Encounter (Signed)
IC patient and sent Rx to [facility]

## 2018-10-09 NOTE — Telephone Encounter (Signed)
Received call from [facility] stating they do not fill lorazepam at this office. If any questions please return call to [phone].

## 2018-10-09 NOTE — Telephone Encounter (Signed)
Call patient to see which pharm she wants this to go to.

## 2018-10-10 ENCOUNTER — Inpatient Hospital Stay (HOSPITAL_COMMUNITY): Admission: RE | Admit: 2018-10-10 | Payer: No Typology Code available for payment source | Source: Ambulatory Visit

## 2018-10-10 ENCOUNTER — Ambulatory Visit: Payer: No Typology Code available for payment source

## 2018-10-10 ENCOUNTER — Telehealth (HOSPITAL_COMMUNITY): Payer: Self-pay

## 2018-10-10 DIAGNOSIS — R252 Cramp and spasm: Secondary | ICD-10-CM

## 2018-10-10 DIAGNOSIS — M542 Cervicalgia: Secondary | ICD-10-CM

## 2018-10-10 DIAGNOSIS — M79622 Pain in left upper arm: Secondary | ICD-10-CM

## 2018-10-10 DIAGNOSIS — M6281 Muscle weakness (generalized): Secondary | ICD-10-CM

## 2018-10-10 DIAGNOSIS — M546 Pain in thoracic spine: Secondary | ICD-10-CM

## 2018-10-10 NOTE — Telephone Encounter (Signed)
Encounter complete. 
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
## 2018-10-12 ENCOUNTER — Ambulatory Visit: Payer: No Typology Code available for payment source

## 2018-10-12 ENCOUNTER — Ambulatory Visit (HOSPITAL_COMMUNITY)
Admission: RE | Admit: 2018-10-12 | Discharge: 2018-10-12 | Disposition: A | Discharge status: Home / Self Care | Payer: No Typology Code available for payment source | Source: Ambulatory Visit | Attending: Cardiology | Admitting: Cardiology

## 2018-10-12 DIAGNOSIS — R6884 Jaw pain: Secondary | ICD-10-CM | POA: Insufficient documentation

## 2018-10-12 LAB — MYOCARDIAL PERFUSION IMAGING
CHL CUP MPHR: 164 {beats}/min
CHL CUP NUCLEAR SDS: 1
CHL CUP NUCLEAR SRS: 0
CHL CUP RESTING HR STRESS: 69 {beats}/min
CSEPED: 8 min
CSEPHR: 92 %
CSEPPHR: 151 {beats}/min
Estimated workload: 8.9 METS
Exercise duration (sec): 40 s
LV dias vol: 98 mL (ref 46–106)
LV sys vol: 41 mL
RPE: 18
SSS: 1
TID: 1.01

## 2018-10-12 MED ORDER — TECHNETIUM TC 99M TETROFOSMIN IV KIT
9.9000 | PACK | Freq: Once | INTRAVENOUS | Status: AC | PRN
  Administered 2018-10-12: 9.9 via INTRAVENOUS
  Filled 2018-10-12: qty 10

## 2018-10-12 MED ORDER — TECHNETIUM TC 99M TETROFOSMIN IV KIT
30.6000 | PACK | Freq: Once | INTRAVENOUS | Status: AC | PRN
  Administered 2018-10-12: 30.6 via INTRAVENOUS
  Filled 2018-10-12: qty 31

## 2018-10-16 ENCOUNTER — Ambulatory Visit (INDEPENDENT_AMBULATORY_CARE_PROVIDER_SITE_OTHER): Payer: Self-pay | Admitting: Specialist

## 2018-10-16 ENCOUNTER — Encounter (INDEPENDENT_AMBULATORY_CARE_PROVIDER_SITE_OTHER): Payer: Self-pay | Admitting: Specialist

## 2018-10-16 VITALS — BP 100/58 | HR 73 | Ht 68.0 in | Wt 177.0 lb

## 2018-10-16 DIAGNOSIS — M5020 Other cervical disc displacement, unspecified cervical region: Secondary | ICD-10-CM

## 2018-10-16 DIAGNOSIS — M25512 Pain in left shoulder: Secondary | ICD-10-CM
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
## 2018-10-16 NOTE — Patient Instructions (Signed)
Plan: Avoid overhead lifting and overhead use of the arms. Do not lift greater than 5 lbs. Adjust head rest in vehicle to prevent hyperextension if rear ended. Take extra precautions to avoid falling.  Avoid overhPlead lifting and overhead use of the arms. Do not lift greater than 10 lbs. Tylenol ES one every 6-8 hours for pain and inflamation.

## 2018-10-19 ENCOUNTER — Ambulatory Visit (INDEPENDENT_AMBULATORY_CARE_PROVIDER_SITE_OTHER): Payer: Self-pay | Admitting: Specialist

## 2018-10-19 NOTE — Telephone Encounter (Signed)
Patient seen by Dr Louanne Skye and evaluated.

## 2018-11-07 ENCOUNTER — Encounter: Payer: Self-pay | Admitting: Internal Medicine

## 2018-11-07 ENCOUNTER — Ambulatory Visit (INDEPENDENT_AMBULATORY_CARE_PROVIDER_SITE_OTHER): Payer: No Typology Code available for payment source | Admitting: Internal Medicine

## 2018-11-07 VITALS — BP 110/72 | HR 62 | Ht 68.0 in | Wt 182.0 lb

## 2018-11-07 DIAGNOSIS — I77810 Thoracic aortic ectasia: Secondary | ICD-10-CM

## 2018-11-07 DIAGNOSIS — I480 Paroxysmal atrial fibrillation: Secondary | ICD-10-CM

## 2018-11-07 DIAGNOSIS — I341 Nonrheumatic mitral (valve) prolapse: Secondary | ICD-10-CM
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
## 2018-11-07 NOTE — Patient Instructions (Signed)
Medication Instructions:  Continue current medications If you need a refill on your cardiac medications before your next appointment, please call your pharmacy.   Testing/Procedures: CT angiogram of chest/aorta in ONE YEAR to assess aortic root You will receive a call to schedule this  Follow-Up: At [facility], you and your health needs are our priority.  As part of our continuing mission to provide you with exceptional heart care, we have created designated Provider Care Teams.  These Care Teams include your primary Cardiologist (physician) and Advanced Practice Providers (APPs -  Physician Assistants and Nurse Practitioners) who all work together to provide you with the care you need, when you need it. You will need a follow up appointment in 12 months.  Please call our office 2 months in advance to schedule this appointment.  You may see Pixie Casino, MD or one of the following Advanced Practice Providers on your designated Care Team: [place], [place] . Fabian Sharp, PA-C  Any Other Special Instructions Will Be Listed Below (If Applicable).

## 2018-11-28 ENCOUNTER — Encounter: Payer: Self-pay | Admitting: Nurse Practitioner

## 2018-11-28 ENCOUNTER — Ambulatory Visit: Payer: Self-pay | Attending: Nurse Practitioner | Admitting: Nurse Practitioner

## 2018-11-28 ENCOUNTER — Ambulatory Visit (HOSPITAL_COMMUNITY)
Admission: RE | Admit: 2018-11-28 | Discharge: 2018-11-28 | Disposition: A | Discharge status: Home / Self Care | Payer: No Typology Code available for payment source | Source: Ambulatory Visit | Attending: Nurse Practitioner | Admitting: Nurse Practitioner

## 2018-11-28 VITALS — BP 108/69 | HR 75 | Temp 99.0°F | Ht 68.0 in | Wt 185.2 lb

## 2018-11-28 DIAGNOSIS — M722 Plantar fascial fibromatosis: Secondary | ICD-10-CM

## 2018-11-28 DIAGNOSIS — G8929 Other chronic pain: Secondary | ICD-10-CM | POA: Insufficient documentation

## 2018-11-28 DIAGNOSIS — M25561 Pain in right knee: Secondary | ICD-10-CM

## 2018-11-28 DIAGNOSIS — L989 Disorder of the skin and subcutaneous tissue, unspecified: Secondary | ICD-10-CM

## 2018-11-28 LAB — POCT INFLUENZA A/B
Influenza A, POC: NEGATIVE
Influenza B, POC: NEGATIVE
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
## 2018-11-29 ENCOUNTER — Other Ambulatory Visit: Payer: Self-pay | Admitting: Nurse Practitioner

## 2018-11-29 DIAGNOSIS — M7122 Synovial cyst of popliteal space [Baker], left knee: Secondary | ICD-10-CM

## 2018-11-29 DIAGNOSIS — M7121 Synovial cyst of popliteal space [Baker], right knee: Secondary | ICD-10-CM

## 2018-11-30 ENCOUNTER — Encounter: Payer: Self-pay | Admitting: Nurse Practitioner

## 2018-11-30 ENCOUNTER — Telehealth: Payer: Self-pay

## 2018-11-30 NOTE — Telephone Encounter (Signed)
-----  Message from [name], NP sent at 11/29/2018 11:23 PM EST ----- Xray of knee negative aside from mild degenerative changes. Will order [place]. Criteria not met at this time for MRI.

## 2018-11-30 NOTE — Telephone Encounter (Signed)
CMA spoke to patient to inform on results.  Pt. Understood. Pt. Verified DOB.

## 2018-12-04 MED FILL — METOPROLOL SUCCINATE ER 25: 25 | 30 days supply | Qty: 45 | Fill #1

## 2018-12-06 ENCOUNTER — Ambulatory Visit (INDEPENDENT_AMBULATORY_CARE_PROVIDER_SITE_OTHER): Payer: Self-pay | Admitting: Podiatrist

## 2018-12-06 DIAGNOSIS — M79672 Pain in left foot: Secondary | ICD-10-CM

## 2018-12-06 DIAGNOSIS — B351 Tinea unguium: Secondary | ICD-10-CM

## 2018-12-06 NOTE — Progress Notes (Signed)
  Chief Complaint  Patient presents with  . Plantar Fasciitis    heel pain   . Nail Problem    i have some fungus on my toe nails      HPI: Patient is 58 y.o. female who presents today for arch pain left foot and some fungus on right hallux nail that is being treated with tea tree oil and she gets pedicures regularly.     Review of Systems  DATA OBTAINED: from patient  GENERAL: Feels well no fevers, no fatigue, no changes in appetite SKIN: No itching, no rashes, no open wounds EYES: No eye pain,no redness, no discharge EARS: No earache,no ringing of ears, NOSE: No congestion, no drainage, no bleeding  MOUTH/THROAT: No mouth pain, No sore throat, No difficulty chewing or swallowing  RESPIRATORY: No cough, no wheezing, no SOB CARDIAC: No chest pain,no heart palpitations, GI: No abdominal pain, No Nausea, no vomiting, no diarrhea, no heartburn or no reflux  GU: No dysuria, no increased frequency or urgency MUSCULOSKELETAL: No unrelieved bone/joint pain,  NEUROLOGIC: Awake, alert, appropriate to situation, No change in mental status. PSYCHIATRIC: No overt anxiety or sadness.No behavior issue.      Physical Exam  GENERAL APPEARANCE: Alert, conversant. Appropriately groomed. No acute distress.  VASCULAR: Pedal pulses palpable DP and PT bilateral.  Capillary refill time is immediate to all digits,  Proximal to distal cooling it warm to warm.  Digital hair growth is present bilateral  NEUROLOGIC: sensation is intact epicritically and protectively to 5.07 monofilament at 5/5 sites bilateral.  Light touch is intact bilateral, vibratory sensation intact bilateral, achilles tendon reflex is intact bilateral.  MUSCULOSKELETAL: pain in the mid arch of the left foot present.  otherwise acceptable muscle strength, tone and stability bilateral.  Intrinsic muscluature intact bilateral.  Range of motion at ankle and first MPJ is normal bilateral. Rectus appearance of foot and digits noted bilateral.    DERMATOLOGIC: right hallux nail has some discoloration noted consistent with onychomycosis-  Otherwise skin color, texture, and turger are within normal limits.  No preulcerative lesions are seen, no interdigital maceration noted.  No open lesions present.  Digital nails are asymptomatic.     Assessment   Arch pain left  Plan  Arch pads dispensed- recommended otc inserts from omega or off n running for her shoes,  Stretching exercises dispensed.  She will call if the pain gets worse and will consider an injection.

## 2018-12-06 NOTE — Patient Instructions (Signed)

## 2018-12-07 ENCOUNTER — Ambulatory Visit: Payer: Self-pay | Attending: Family Medicine

## 2018-12-07 ENCOUNTER — Ambulatory Visit (HOSPITAL_COMMUNITY)
Admission: RE | Admit: 2018-12-07 | Discharge: 2018-12-07 | Disposition: A | Discharge status: Home / Self Care | Payer: Self-pay | Source: Ambulatory Visit | Attending: Nurse Practitioner | Admitting: Nurse Practitioner

## 2018-12-07 DIAGNOSIS — M7121 Synovial cyst of popliteal space [Baker], right knee: Secondary | ICD-10-CM | POA: Insufficient documentation

## 2018-12-08 ENCOUNTER — Other Ambulatory Visit: Payer: Self-pay | Admitting: Obstetrics and Gynecology

## 2018-12-08 ENCOUNTER — Telehealth: Payer: Self-pay | Admitting: Nurse Practitioner

## 2018-12-08 ENCOUNTER — Other Ambulatory Visit: Payer: Self-pay | Admitting: Nurse Practitioner

## 2018-12-08 DIAGNOSIS — Z1231 Encounter for screening mammogram for malignant neoplasm of breast: Secondary | ICD-10-CM

## 2018-12-08 NOTE — Telephone Encounter (Signed)
New Message   Pt calling states the dermatologist she was referred to does not accept the orange card any more and would another referral. Please f/u

## 2018-12-10 ENCOUNTER — Other Ambulatory Visit: Payer: Self-pay | Admitting: Nurse Practitioner

## 2018-12-10 DIAGNOSIS — M7051 Other bursitis of knee, right knee: Secondary | ICD-10-CM

## 2018-12-11 ENCOUNTER — Ambulatory Visit: Payer: Self-pay | Attending: Nurse Practitioner

## 2018-12-11 NOTE — Telephone Encounter (Signed)
Will route to PCP 

## 2018-12-12 NOTE — Telephone Encounter (Signed)
Please get with Alinda Sierras regarding who referral should go to. Thanks

## 2018-12-13 ENCOUNTER — Ambulatory Visit
Admission: RE | Admit: 2018-12-13 | Discharge: 2018-12-13 | Disposition: A | Discharge status: Home / Self Care | Payer: No Typology Code available for payment source | Source: Ambulatory Visit | Attending: Nurse Practitioner | Admitting: Nurse Practitioner

## 2018-12-13 DIAGNOSIS — Z1231 Encounter for screening mammogram for malignant neoplasm of breast: Secondary | ICD-10-CM

## 2018-12-14 NOTE — Telephone Encounter (Signed)
CMA called patient and to inform the referral was send to [facility], and they will reach out to patient to schedule an appt. Pt. Understood.

## 2019-01-04 ENCOUNTER — Other Ambulatory Visit: Payer: Self-pay

## 2019-01-04 ENCOUNTER — Ambulatory Visit (INDEPENDENT_AMBULATORY_CARE_PROVIDER_SITE_OTHER): Payer: Self-pay | Admitting: Specialist

## 2019-01-04 ENCOUNTER — Encounter (INDEPENDENT_AMBULATORY_CARE_PROVIDER_SITE_OTHER): Payer: Self-pay | Admitting: Specialist

## 2019-01-04 VITALS — BP 102/65 | HR 69 | Ht 68.0 in | Wt 185.0 lb

## 2019-01-04 DIAGNOSIS — Z5321 Procedure and treatment not carried out due to patient leaving prior to being seen by health care provider: Secondary | ICD-10-CM

## 2019-01-12 ENCOUNTER — Telehealth (INDEPENDENT_AMBULATORY_CARE_PROVIDER_SITE_OTHER): Payer: Self-pay

## 2019-01-12 NOTE — Telephone Encounter (Signed)
Called and left a VM for patient to call back before her appointment on Monday, 01/15/2019 to answer COVID-19 screening questions.

## 2019-01-15 ENCOUNTER — Ambulatory Visit (INDEPENDENT_AMBULATORY_CARE_PROVIDER_SITE_OTHER): Payer: Self-pay | Admitting: Specialist

## 2019-01-26 ENCOUNTER — Other Ambulatory Visit: Payer: Self-pay

## 2019-01-26 ENCOUNTER — Telehealth: Payer: Self-pay | Admitting: Internal Medicine

## 2019-01-26 ENCOUNTER — Ambulatory Visit: Payer: Self-pay | Attending: Internal Medicine | Admitting: Internal Medicine

## 2019-01-26 DIAGNOSIS — R05 Cough: Secondary | ICD-10-CM

## 2019-01-26 DIAGNOSIS — R0982 Postnasal drip: Secondary | ICD-10-CM

## 2019-01-26 DIAGNOSIS — Z7189 Other specified counseling: Secondary | ICD-10-CM

## 2019-01-26 DIAGNOSIS — Z91013 Allergy to seafood: Secondary | ICD-10-CM

## 2019-01-26 MED ORDER — FLUTICASONE PROPIONATE 50 MCG/ACT NA SUSP: 1.0000 | Freq: Every day | NASAL | 0 refills | Status: DC | PRN
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
## 2019-01-27 MED FILL — FLUTICASONE PROP 50 MCG SPR: 50 | 30 days supply | Qty: 16 | Fill #0

## 2019-01-29 ENCOUNTER — Telehealth: Payer: Self-pay | Admitting: Nurse Practitioner

## 2019-01-29 ENCOUNTER — Ambulatory Visit: Payer: Self-pay

## 2019-01-29 ENCOUNTER — Other Ambulatory Visit: Payer: Self-pay

## 2019-01-29 MED ORDER — FLUTICASONE PROPIONATE 50 MCG/ACT NA SUSP: 1.0000 | Freq: Every day | NASAL | 0 refills | Status: DC | PRN

## 2019-01-29 NOTE — Telephone Encounter (Signed)
Patient called to check on getting prescribed a anti biotic for her nasal infection. States she  Has nose drip and yellow flame. Please follow up.

## 2019-01-29 NOTE — Telephone Encounter (Signed)
Based on physical exam from recent office visit. Does not meet criteria for antibiotic. Will reassess on Monday for telehealth visit.

## 2019-01-29 NOTE — Telephone Encounter (Signed)
Contacted pt and made aware of Dr. Wynetta Emery message. [facility] [address] [facility]

## 2019-01-29 NOTE — Telephone Encounter (Signed)
Will forward to Dr. Johnson  
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
## 2019-01-29 NOTE — Telephone Encounter (Signed)
Pt is aware of the Flonase being sent to the pharmacy. Pt is requesting a antibiotic like Augmentin or Azithromycin

## 2019-01-30 MED FILL — METOPROLOL SUCCINATE ER 25: 25 | 30 days supply | Qty: 45 | Fill #2

## 2019-01-30 NOTE — Telephone Encounter (Signed)
No antibiotic. Yellow phlegm could be related to the pollen

## 2019-01-30 NOTE — Telephone Encounter (Signed)
Follow up   Pt is calling to check the antibiotic she requested. Please f/u

## 2019-01-30 NOTE — Telephone Encounter (Signed)
CMA spoke to patient and inform on PCP advising. Pt. Stated she still want to know if she still need an antibiotic because she have yellow phlegm dripping out.

## 2019-01-31 NOTE — Telephone Encounter (Signed)
CMA spoke to patient to inform no antibiotic is given.  Pt. Understood.

## 2019-02-05 ENCOUNTER — Encounter (INDEPENDENT_AMBULATORY_CARE_PROVIDER_SITE_OTHER): Payer: Self-pay | Admitting: Specialist

## 2019-02-07 NOTE — Patient Instructions (Signed)
Left without being seen.

## 2019-03-06 ENCOUNTER — Telehealth: Payer: Self-pay | Admitting: Internal Medicine

## 2019-03-06 ENCOUNTER — Telehealth: Payer: Self-pay | Admitting: Nurse Practitioner

## 2019-03-06 NOTE — Telephone Encounter (Signed)
Patient called stating she is having anxiety. Patient states she is interested in the buspar. Patient states she is having anxiety due to COVID and having a terminally ill pet.She states she would like to know if it is the right drug for her. Her cardiologist also suggested for her to FU with you. [facility] [address] with phone number [phone]

## 2019-03-06 NOTE — Telephone Encounter (Signed)
Spoke with pt who state she is worried because her blood pressure has been running higher than normal for the past two nights. She report last night BP was 141/81 and the night before was around the same. She state, she normal take 12.5 mg of Metoprolol but took an extra dose both night. She report BP this morning was back to her normal at 112/69. Pt states she has been a little more anxious lately as her dog was dx with a terminal illness. Pt informed that stress and anxiety can cause BP to increase and encouraged to continue to monitor BP for a couple of days.   Will route to Pharm D for any further recommendations.

## 2019-03-06 NOTE — Telephone Encounter (Signed)
Will route to PCP 

## 2019-03-06 NOTE — Telephone Encounter (Signed)
Pt updated with Pharm D recommendations and voiced understanding.

## 2019-03-06 NOTE — Telephone Encounter (Signed)
Pt c/o BP issue: STAT if pt c/o blurred vision, one-sided weakness or slurred speech  1. What are your last 5 BP readings?  143/81 (normally 115/69)  2. Are you having any other symptoms (ex. Dizziness, headache, blurred vision, passed out)?  Anxiety  3. What is your BP issue?  Pt reports higher bp than normal. She has had to take an extra 1/2 tablet of metoprolol

## 2019-03-06 NOTE — Telephone Encounter (Signed)
I agree with RN assessment.  She can take extra 1/2 tablet of metoprolol succ (25 mg tablet), but I would suggest only if systolic BP > 749.  Probably stress/anxiety related.  Continue to monitor and call back if regularly > 150.

## 2019-03-07 ENCOUNTER — Other Ambulatory Visit: Payer: Self-pay | Admitting: Nurse Practitioner

## 2019-03-07 MED ORDER — BUSPIRONE HCL 10 MG PO TABS: 10.0000 mg | ORAL_TABLET | Freq: Three times a day (TID) | ORAL | 0 refills | Status: DC

## 2019-03-07 NOTE — Telephone Encounter (Signed)
Medication has been sent.  

## 2019-03-12 NOTE — Telephone Encounter (Signed)
CMA spoke to patient and verified she got medication that PCP sent for her anxiety.

## 2019-04-11 ENCOUNTER — Telehealth: Payer: No Typology Code available for payment source

## 2019-04-11 NOTE — Progress Notes (Deleted)
Virtual Visit via Video Note  I connected with [name] on 04/11/19 at  3:30 PM EDT by a video enabled telemedicine application and verified that I am speaking with the correct person using two identifiers.   I discussed the limitations of evaluation and management by telemedicine and the availability of in person appointments. The patient expressed understanding and agreed to proceed.  Patient location: My Location:  [place] office Persons on the visit:   History of Present Illness:    Observations/Objective:   Assessment and Plan:   Follow Up Instructions:    I discussed the assessment and treatment plan with the patient. The patient was provided an opportunity to ask questions and all were answered. The patient agreed with the plan and demonstrated an understanding of the instructions.   The patient was advised to call back or seek an in-person evaluation if the symptoms worsen or if the condition fails to improve as anticipated.  I provided *** minutes of non-face-to-face time during this encounter.   Freeman Caldron, PA-C  Patient ID: [name], female   DOB: 07-18-1961, 58 y.o.   MRN: [id number]

## 2019-04-12 ENCOUNTER — Telehealth (HOSPITAL_BASED_OUTPATIENT_CLINIC_OR_DEPARTMENT_OTHER): Payer: No Typology Code available for payment source | Admitting: Physician Assistant

## 2019-04-12 ENCOUNTER — Telehealth: Payer: Self-pay | Admitting: Internal Medicine

## 2019-04-12 DIAGNOSIS — H9202 Otalgia, left ear: Secondary | ICD-10-CM

## 2019-04-12 MED ORDER — AMOXICILLIN 500 MG PO CAPS: 1000.0000 mg | ORAL_CAPSULE | Freq: Two times a day (BID) | ORAL | 0 refills | Status: DC

## 2019-04-12 MED ORDER — FLUCONAZOLE 150 MG PO TABS: 150.0000 mg | ORAL_TABLET | Freq: Once | ORAL | 0 refills | Status: DC

## 2019-04-12 MED ORDER — FLUCONAZOLE 150 MG PO TABS: 150.0000 mg | ORAL_TABLET | Freq: Once | ORAL | 0 refills | Status: AC

## 2019-04-12 NOTE — Telephone Encounter (Signed)
New Message    Patient suppose to have CT angio and wants to have it done before August while she still has the orange card form [facility].  Please scheduled patient or let me know of the dates and I'll call the patient to let her know.
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
## 2019-04-12 NOTE — Addendum Note (Signed)
Addended by: Argentina Donovan on: 04/12/2019 02:49 PM   Modules accepted: Orders

## 2019-04-18 ENCOUNTER — Ambulatory Visit: Payer: Self-pay | Admitting: Specialist

## 2019-04-28 ENCOUNTER — Other Ambulatory Visit: Payer: Self-pay | Admitting: Nurse Practitioner

## 2019-05-16 ENCOUNTER — Other Ambulatory Visit: Payer: Self-pay

## 2019-05-16 ENCOUNTER — Ambulatory Visit: Payer: Self-pay | Attending: Nurse Practitioner | Admitting: Physician Assistant

## 2019-05-16 DIAGNOSIS — M545 Low back pain, unspecified: Secondary | ICD-10-CM

## 2019-05-16 DIAGNOSIS — K219 Gastro-esophageal reflux disease without esophagitis: Secondary | ICD-10-CM

## 2019-05-16 DIAGNOSIS — Z20822 Contact with and (suspected) exposure to covid-19: Secondary | ICD-10-CM

## 2019-05-16 MED ORDER — NAPROXEN 500 MG PO TABS: 500.0000 mg | ORAL_TABLET | Freq: Two times a day (BID) | ORAL | 0 refills | Status: DC

## 2019-05-16 MED ORDER — METHOCARBAMOL 500 MG PO TABS: 500.0000 mg | ORAL_TABLET | Freq: Four times a day (QID) | ORAL | 0 refills | Status: DC | PRN

## 2019-05-16 MED FILL — NAPROXEN 500 MG TABLET: 500 | 30 days supply | Qty: 60 | Fill #0

## 2019-05-16 MED FILL — METHOCARBAMOL 500 MG TABS: 500 | 22 days supply | Qty: 90 | Fill #0

## 2019-05-16 NOTE — Progress Notes (Signed)
Patient ID: [name], female   DOB: [birth year], 58 y.o.   MRN: [id number] Virtual Visit via Telephone Note  I connected with [name] on 05/16/19 at  8:30 AM EDT by telephone and verified that I am speaking with the correct person using two identifiers.   I discussed the limitations, risks, security and privacy concerns of performing an evaluation and management service by telephone and the availability of in person appointments. I also discussed with the patient that there may be a patient responsible charge related to this service. The patient expressed understanding and agreed to proceed.  Patient location:  home My Location:  [facility] office Persons on the call: me and the patient.     History of Present Illness:  Pain for 1-2 weeks.  Has seen Dr Louanne Skye before for L shoulder pain, neck pain.  2 weeks ago was lifting heavy items and has had middle back, lower back, top of shoulder L side, and B hips.  Pain is intermittent and comes and goes.  Sharp when it occurs.  Worse with movement.  No weakness.  No dizziness.  No radiating pain or paresthesias.  Has angio CT scheduled 05/28/2019 to assess dilated aortic root and is being assessed by Dr Imagene Riches.  No SOB.    Takes Tums for GERD.  FH esophageal strictures and diverticulosis.  Occasionally has reflux.  Wonders if she needs esophageal studies.  Denies melena/hematochezia.  Had colonoscopy at age 89.  Mild diverticula was found.  No diarrhea/constipation.      Observations/Objective: A&Ox3   Assessment and Plan: 1. Gastroesophageal reflux disease without esophagitis - Ambulatory referral to Gastroenterology  2. Acute left-sided low back pain without sciatica also sent Rx naprosyn and methocarbamol.  She can try these for her all over aches and pains.  I discussed with her at length that if any pain becomes localized, she develops CP/SOB/dizziness/weakness to go to ED/call 911.  Patient agrees.   - Ambulatory referral to  Orthopedic Surgery    Follow Up Instructions: See Zelda in ~8month   I discussed the assessment and treatment plan with the patient. The patient was provided an opportunity to ask questions and all were answered. The patient agreed with the plan and demonstrated an understanding of the instructions.   The patient was advised to call back or seek an in-person evaluation if the symptoms worsen or if the condition fails to improve as anticipated.  I provided 23 minutes of non-face-to-face time during this encounter.   Freeman Caldron, PA-C

## 2019-05-16 NOTE — Progress Notes (Signed)
Pt states she doesn't know where her pain is coming from  Pt states she has had this pain for a wekk to two weeks

## 2019-05-17 ENCOUNTER — Telehealth: Payer: Self-pay | Admitting: Nurse Practitioner

## 2019-05-17 NOTE — Telephone Encounter (Addendum)
Pt would like to follow up on the referral that was suppose to be sent to gastroenterologist..please follow up

## 2019-05-18 ENCOUNTER — Telehealth: Payer: Self-pay | Admitting: Nurse Practitioner

## 2019-05-18 NOTE — Telephone Encounter (Signed)
Pt name and DOB verified. Pt state she GI specialist has called and informed her that her appointment is Aug. 13th.

## 2019-05-18 NOTE — Telephone Encounter (Signed)
Patient called stating she is having a heavy chest and back pain and is unsure if she can get a chest x-ray done. Please follow up.

## 2019-05-18 NOTE — Telephone Encounter (Signed)
Advised patient to go to the ED or UC if symptoms worsen and needs immediate attention

## 2019-05-18 NOTE — Telephone Encounter (Signed)
Spoke to patient about concerns. She states that the discomfort she was experiencing happens to be the same of which a friend experienced and was diagnosed with PNA. Advised patient that she should go to UC or ED if she is having pain in chest.

## 2019-05-20 LAB — NOVEL CORONAVIRUS, NAA: SARS-CoV-2, NAA: NOT DETECTED

## 2019-05-21 ENCOUNTER — Telehealth: Payer: Self-pay | Admitting: Nurse Practitioner

## 2019-05-21 NOTE — Telephone Encounter (Signed)
Patient can call GI to see if they can get her in sooner. She is welcome to come in for updated labs.

## 2019-05-21 NOTE — Telephone Encounter (Signed)
Moderate abdominal cramping, pain after eating, no fever. Persistent back pain increased. Pt wants to know if provider can get her into GI sooner than scheduled 06/07/19. Pt wants to know if provider wants to do any blood work to check symptoms. Pt [phone].

## 2019-05-22 ENCOUNTER — Emergency Department (HOSPITAL_COMMUNITY): Payer: No Typology Code available for payment source

## 2019-05-22 ENCOUNTER — Other Ambulatory Visit: Payer: Self-pay

## 2019-05-22 ENCOUNTER — Ambulatory Visit: Payer: Self-pay | Attending: Family Medicine

## 2019-05-22 ENCOUNTER — Emergency Department (HOSPITAL_COMMUNITY)
Admission: EM | Admit: 2019-05-22 | Discharge: 2019-05-22 | Disposition: A | Discharge status: Home / Self Care | Payer: No Typology Code available for payment source | Attending: Emergency Medicine | Admitting: Emergency Medicine

## 2019-05-22 ENCOUNTER — Encounter (HOSPITAL_COMMUNITY): Payer: Self-pay | Admitting: Emergency Medicine

## 2019-05-22 DIAGNOSIS — Z7982 Long term (current) use of aspirin: Secondary | ICD-10-CM | POA: Insufficient documentation

## 2019-05-22 DIAGNOSIS — K824 Cholesterolosis of gallbladder: Secondary | ICD-10-CM

## 2019-05-22 DIAGNOSIS — Z0000 Encounter for general adult medical examination without abnormal findings: Secondary | ICD-10-CM

## 2019-05-22 DIAGNOSIS — R1084 Generalized abdominal pain: Secondary | ICD-10-CM | POA: Insufficient documentation

## 2019-05-22 LAB — BASIC METABOLIC PANEL
Anion gap: 9 (ref 5–15)
BUN: 15 mg/dL (ref 6–20)
CO2: 25 mmol/L (ref 22–32)
Calcium: 9.3 mg/dL (ref 8.9–10.3)
Chloride: 103 mmol/L (ref 98–111)
Creatinine, Ser: 0.69 mg/dL (ref 0.44–1.00)
GFR calc Af Amer: >60 mL/min (ref >60)
GFR calc non Af Amer: >60 mL/min (ref >60)
Glucose, Bld: 94 mg/dL (ref 70–99)
Potassium: 3.8 mmol/L (ref 3.5–5.1)
Sodium: 137 mmol/L (ref 135–145)

## 2019-05-22 LAB — CBC
HCT: 36.3 % (ref 36.0–46.0)
Hemoglobin: 11.7 g/dL — ABNORMAL LOW (ref 12.0–15.0)
MCH: 30.1 pg (ref 26.0–34.0)
MCHC: 32.2 g/dL (ref 30.0–36.0)
MCV: 93.3 fL (ref 80.0–100.0)
Platelets: 333 10*3/uL (ref 150–400)
RBC: 3.89 MIL/uL (ref 3.87–5.11)
RDW: 13 % (ref 11.5–15.5)
WBC: 6.5 10*3/uL (ref 4.0–10.5)
nRBC: 0 % (ref 0.0–0.2)

## 2019-05-22 LAB — TROPONIN I (HIGH SENSITIVITY)
Troponin I (High Sensitivity): 3 ng/L (ref <18)
Troponin I (High Sensitivity): 4 ng/L (ref <18)

## 2019-05-22 LAB — HEPATIC FUNCTION PANEL
ALT: 19 U/L (ref 0–44)
AST: 23 U/L (ref 15–41)
Albumin: 3.8 g/dL (ref 3.5–5.0)
Alkaline Phosphatase: 54 U/L (ref 38–126)
Bilirubin, Direct: <0.1 mg/dL (ref 0.0–0.2)
Total Bilirubin: 0.6 mg/dL (ref 0.3–1.2)
Total Protein: 7.5 g/dL (ref 6.5–8.1)

## 2019-05-22 LAB — LIPASE, BLOOD: Lipase: 29 U/L (ref 11–51)

## 2019-05-22 MED ORDER — SODIUM CHLORIDE 0.9% FLUSH: 3.0000 mL | Freq: Once | INTRAVENOUS | Status: DC

## 2019-05-22 MED ORDER — PANTOPRAZOLE SODIUM 40 MG IV SOLR
40.0000 mg | Freq: Once | INTRAVENOUS | Status: DC
  Filled 2019-05-22: qty 40

## 2019-05-22 MED ORDER — ALUM & MAG HYDROXIDE-SIMETH 200-200-20 MG/5ML PO SUSP
30.0000 mL | Freq: Once | ORAL | Status: AC
  Administered 2019-05-22: 30 mL via ORAL
  Filled 2019-05-22: qty 30

## 2019-05-22 MED ORDER — PANTOPRAZOLE SODIUM 40 MG PO TBEC
40.0000 mg | DELAYED_RELEASE_TABLET | Freq: Once | ORAL | Status: AC
  Administered 2019-05-22: 21:00:00 40 mg via ORAL
  Filled 2019-05-22: qty 1

## 2019-05-22 MED ORDER — PANTOPRAZOLE SODIUM 20 MG PO TBEC: 20.0000 mg | DELAYED_RELEASE_TABLET | Freq: Every day | ORAL | 0 refills | Status: DC

## 2019-05-22 MED ORDER — LIDOCAINE VISCOUS HCL 2 % MT SOLN
15.0000 mL | Freq: Once | OROMUCOSAL | Status: AC
  Administered 2019-05-22: 15 mL via ORAL
  Filled 2019-05-22: qty 15

## 2019-05-22 NOTE — Discharge Instructions (Addendum)
Your labs are normal here A gallbladder polyp is noted and will need op follow up Please take protonix as prescribed Return if worsening symptoms, especially worsening pain, unable to tolerate fluids

## 2019-05-22 NOTE — ED Triage Notes (Signed)
Pt reports having lower abd pain and cramping for 10 days- that has recently been radiating into left side of chest.pt is tearful in triage due to having to find a new place to live. Pr denies any n/v/d. Pt states she has had some constipation but was able to have BM today.
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
## 2019-05-22 NOTE — ED Notes (Signed)
Patient Alert and oriented to baseline. Stable and ambulatory to baseline. Patient verbalized understanding of the discharge instructions.  Patient belongings were taken by the patient.   

## 2019-05-23 ENCOUNTER — Ambulatory Visit: Payer: Self-pay

## 2019-05-23 ENCOUNTER — Telehealth: Payer: Self-pay | Admitting: Specialist

## 2019-05-23 ENCOUNTER — Telehealth: Payer: Self-pay | Admitting: Gastroenterology

## 2019-05-23 ENCOUNTER — Encounter: Payer: Self-pay | Admitting: Specialist

## 2019-05-23 ENCOUNTER — Ambulatory Visit (INDEPENDENT_AMBULATORY_CARE_PROVIDER_SITE_OTHER): Payer: Self-pay | Admitting: Specialist

## 2019-05-23 ENCOUNTER — Telehealth: Payer: Self-pay | Admitting: Nurse Practitioner

## 2019-05-23 VITALS — BP 99/70 | HR 65 | Ht 68.0 in | Wt 171.0 lb

## 2019-05-23 DIAGNOSIS — M47814 Spondylosis without myelopathy or radiculopathy, thoracic region: Secondary | ICD-10-CM

## 2019-05-23 DIAGNOSIS — M545 Low back pain, unspecified: Secondary | ICD-10-CM

## 2019-05-23 DIAGNOSIS — M5134 Other intervertebral disc degeneration, thoracic region: Secondary | ICD-10-CM

## 2019-05-23 LAB — CMP14+EGFR
ALT: 15 IU/L (ref 0–32)
AST: 20 IU/L (ref 0–40)
Albumin/Globulin Ratio: 1.6 (ref 1.2–2.2)
Albumin: 4.3 g/dL (ref 3.8–4.9)
Alkaline Phosphatase: 60 IU/L (ref 39–117)
BUN/Creatinine Ratio: 24 — ABNORMAL HIGH (ref 9–23)
BUN: 16 mg/dL (ref 6–24)
Bilirubin Total: 0.4 mg/dL (ref 0.0–1.2)
CO2: 24 mmol/L (ref 20–29)
Calcium: 9.1 mg/dL (ref 8.7–10.2)
Chloride: 101 mmol/L (ref 96–106)
Creatinine, Ser: 0.67 mg/dL (ref 0.57–1.00)
GFR calc Af Amer: 113 mL/min/{1.73_m2} (ref >59)
GFR calc non Af Amer: 98 mL/min/{1.73_m2} (ref >59)
Globulin, Total: 2.7 g/dL (ref 1.5–4.5)
Glucose: 85 mg/dL (ref 65–99)
Potassium: 4.6 mmol/L (ref 3.5–5.2)
Sodium: 140 mmol/L (ref 134–144)
Total Protein: 7 g/dL (ref 6.0–8.5)

## 2019-05-23 LAB — LIPID PANEL
Chol/HDL Ratio: 2.9 ratio (ref 0.0–4.4)
Cholesterol, Total: 192 mg/dL (ref 100–199)
HDL: 66 mg/dL (ref >39)
LDL Calculated: 111 mg/dL — ABNORMAL HIGH (ref 0–99)
Triglycerides: 74 mg/dL (ref 0–149)
VLDL Cholesterol Cal: 15 mg/dL (ref 5–40)

## 2019-05-23 LAB — CBC
Hematocrit: 37.2 % (ref 34.0–46.6)
Hemoglobin: 11.5 g/dL (ref 11.1–15.9)
MCH: 29.6 pg (ref 26.6–33.0)
MCHC: 30.9 g/dL — ABNORMAL LOW (ref 31.5–35.7)
MCV: 96 fL (ref 79–97)
Platelets: 311 10*3/uL (ref 150–450)
RBC: 3.89 x10E6/uL (ref 3.77–5.28)
RDW: 12.5 % (ref 11.7–15.4)
WBC: 5.4 10*3/uL (ref 3.4–10.8)

## 2019-05-23 MED ORDER — TRAMADOL-ACETAMINOPHEN 37.5-325 MG PO TABS: 1.0000 | ORAL_TABLET | Freq: Four times a day (QID) | ORAL | 0 refills | Status: DC | PRN

## 2019-05-23 MED FILL — PANTOPRAZOLE SOD DR 20 MG T: 20 | 30 days supply | Qty: 30 | Fill #0

## 2019-05-23 NOTE — Telephone Encounter (Signed)
Patient came in stating she was seen at her orthopedic office and was told she needs to see pcp for a possible stomach ulcer however we do not have a sooner appointment than august 14th at the time of attempting to schedule. Patient was advised to go to the ED or UC if needing to get seen sooner.Please follow up,.

## 2019-05-23 NOTE — Telephone Encounter (Signed)
Received call from [facility] with [facility] pharmacy advised received Rx for Tramadol with Tylenol-unable to fill at the pharmacy and is calling to see if the Rx can be switched to something else or send to another pharmacy. The number to contact Caryl Pina is [phone]

## 2019-05-23 NOTE — Telephone Encounter (Signed)
Patient was last seen by Dr. Ardis Hughs on 2013 and is scheduled to be seen on Aug 12  By Nevin Bloodgood and is not having a fever (but she just had her results for COVID and are negative). She was seen at the hospital this week and spoke to her Ortho today for a visit and was advised to see [place] for a possible ulcer and have a stool/urine test. Would like to know if she can be seen sooner.

## 2019-05-23 NOTE — Telephone Encounter (Signed)
The pt is scheduled for gerd, BRBPR would like to be seen sooner.  Has not been seen since 2013.  She was advised to be seen at an urgent care by her PCP.  She has an appt with Nevin Bloodgood on 8/12.  No further appt available pt will keep 8/12 appt and has been put on wait list

## 2019-05-23 NOTE — Telephone Encounter (Signed)
Noted  
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
## 2019-05-23 NOTE — Patient Instructions (Addendum)
Plan:Avoid frequent bending and stooping  No lifting greater than 10 lbs. May use ice or moist heat for pain. Weight loss is of benefit. Best medication for lumbar disc disease is arthritis medications like motrin, celebrex and naprosyn.I do not recommend that you take arthritis medications while you are having abdomenal symptoms that may be ulcer symptoms. Exercise is important to improve your indurance and does allow people to function better inspite of back pain. Tramadol.  She has Lower thoracic and upper lumbar pain with bilateral radiation to the epigastrum and also to the suprapubic area. Some qualities of the pain may be due to degenerative disc disease of the lumbar spine and other pain qualities  Seem to be suggestive of a viceral cause and possibly due to GERD or  Peptic ulcer disease. She has been going through stressful period in learning recently that she will need to move with very little notice. I recommend that she take antiacids, eat a bland food diet. She should  Consult her primary care MD concerning possible GI related symptoms and try to decrease her levels of stress. Concerning her back she is advise to see a physical therapist to work on core muscle strengthening and avoid bending and stooping and lifting heavier weights greater than  5-10 lbs.

## 2019-05-23 NOTE — Telephone Encounter (Signed)
Spoke to patient- Name and DOB verified. Patient c/o abdominal pain x 12 days. Was seen in the ED last night.   Patient was advised by her orthopedist to get testing for an ulcer. He felt this would be helpful d/t sxymtoms she presented to him.  Bright red blood per rectum when she wiped 10 days ago. Does have hemorrhoids.   She got a Rx for Protonix last night at the ED. Advised patient to take medication given from EDP last night and to stay away from acidic foods. Milk may be helpful.   Appointment for [place] visit with Provider Oletta Lamas, NP was scheduled at 1:50 pm.  Pt verbalized understanding and has been on MyChart looking at results.

## 2019-05-23 NOTE — Telephone Encounter (Signed)
Patient called back stating that [facility] does not carry the Tramadol with Acetaminophen, she would like for you to call the RX into the [facility] at the corner of [address].  She also wanted to let Dr. Louanne Skye know that she is running a low grade fever and that her GP can not get her in until August 14th.  She wanted to know if Dr. Louanne Skye would be okay to taking the stool/urine sample like they had discussed.  CB#[phone].  Thank you.

## 2019-05-23 NOTE — Telephone Encounter (Signed)
[facility] patient request. Please disregard previous message from [facility] and [employer].  I was going to discuss stool/urine sample with patient as we normally do not do that in this office.  Patient was on hold with PCP's office and could not talk.  Please advise is we need to do anything further.

## 2019-05-23 NOTE — Telephone Encounter (Signed)
Please advise 

## 2019-05-24 ENCOUNTER — Encounter (INDEPENDENT_AMBULATORY_CARE_PROVIDER_SITE_OTHER): Payer: Self-pay | Admitting: Primary Care

## 2019-05-24 ENCOUNTER — Other Ambulatory Visit: Payer: Self-pay

## 2019-05-24 ENCOUNTER — Telehealth (INDEPENDENT_AMBULATORY_CARE_PROVIDER_SITE_OTHER): Payer: Self-pay | Admitting: Primary Care

## 2019-05-24 DIAGNOSIS — F329 Major depressive disorder, single episode, unspecified: Secondary | ICD-10-CM

## 2019-05-24 DIAGNOSIS — K219 Gastro-esophageal reflux disease without esophagitis: Secondary | ICD-10-CM

## 2019-05-24 DIAGNOSIS — F419 Anxiety disorder, unspecified: Secondary | ICD-10-CM

## 2019-05-24 DIAGNOSIS — R1010 Upper abdominal pain, unspecified: Secondary | ICD-10-CM

## 2019-05-24 DIAGNOSIS — F32A Depression, unspecified: Secondary | ICD-10-CM

## 2019-05-24 NOTE — Progress Notes (Signed)
Virtual Visit via Telephone Note  I connected with Amber Millet on 05/24/19 at  1:50 PM EDT by telephone and verified that I am speaking with the correct person using two identifiers.   I discussed the limitations, risks, security and privacy concerns of performing an evaluation and management service by telephone and the availability of in person appointments. I also discussed with the patient that there may be a patient responsible charge related to this service. The patient expressed understanding and agreed to proceed.   History of Present Illness: [name] is being seen for gastro issues she presents with complaints of abdominal pain, cramping , burning and knalling , there is also blood in her stool and when she wipes-bright pink she does have a history of external hemorrhoids. She has an appoint with her GI doctor on August 12,2020 and feel like that is to far out with the symptoms she is having.  Bowls do not have pus or malodorous. She is also been under a lot of stress. Denies ethol, spicy foods or smokes. She dis admit to a 99 degree temperature and her normal is 96.8 or 97.  She is requesting for H.plyoric.   Observations/Objective: Review of Systems  Constitutional: Positive for malaise/fatigue.  Gastrointestinal: Positive for abdominal pain, blood in stool and diarrhea.  Psychiatric/Behavioral: The patient is nervous/anxious.    Assessment and Plan: Diagnoses and all orders for this visit:  Gastroesophageal reflux disease without esophagitis Up to Date reviewed and test should not be done for at least a week- 2 weeks off PPI  -     H. pylori Screen -     H. pylori breath test  Pain of upper abdomen -     H. pylori Screen -     H. pylori breath test  Anxiety and depression    Follow Up Instructions:    I discussed the assessment and treatment plan with the patient. The patient was provided an opportunity to ask questions and all were answered. The  patient agreed with the plan and demonstrated an understanding of the instructions.   The patient was advised to call back or seek an in-person evaluation if the symptoms worsen or if the condition fails to improve as anticipated.  I provided 24 minutes of non-face-to-face time  during this encounter.   Kerin Perna, NP

## 2019-05-28 ENCOUNTER — Ambulatory Visit (INDEPENDENT_AMBULATORY_CARE_PROVIDER_SITE_OTHER)
Admission: RE | Admit: 2019-05-28 | Discharge: 2019-05-28 | Disposition: A | Discharge status: Home / Self Care | Payer: Self-pay | Source: Ambulatory Visit | Attending: Internal Medicine | Admitting: Internal Medicine

## 2019-05-28 ENCOUNTER — Other Ambulatory Visit (INDEPENDENT_AMBULATORY_CARE_PROVIDER_SITE_OTHER): Payer: Self-pay

## 2019-05-28 ENCOUNTER — Encounter

## 2019-05-28 ENCOUNTER — Other Ambulatory Visit: Payer: Self-pay

## 2019-05-28 ENCOUNTER — Encounter: Payer: Self-pay | Admitting: Gastroenterology

## 2019-05-28 ENCOUNTER — Ambulatory Visit (INDEPENDENT_AMBULATORY_CARE_PROVIDER_SITE_OTHER): Payer: Self-pay | Admitting: Gastroenterology

## 2019-05-28 VITALS — BP 100/60 | HR 81 | Temp 97.8°F | Ht 68.0 in | Wt 173.2 lb

## 2019-05-28 DIAGNOSIS — R102 Pelvic and perineal pain: Secondary | ICD-10-CM

## 2019-05-28 DIAGNOSIS — R140 Abdominal distension (gaseous): Secondary | ICD-10-CM | POA: Insufficient documentation

## 2019-05-28 DIAGNOSIS — I77810 Thoracic aortic ectasia: Secondary | ICD-10-CM

## 2019-05-28 DIAGNOSIS — R1013 Epigastric pain: Secondary | ICD-10-CM

## 2019-05-28 DIAGNOSIS — K824 Cholesterolosis of gallbladder: Secondary | ICD-10-CM | POA: Insufficient documentation

## 2019-05-28 LAB — URINALYSIS, ROUTINE W REFLEX MICROSCOPIC
Bilirubin Urine: NEGATIVE
Ketones, ur: NEGATIVE
Leukocytes,Ua: NEGATIVE
Nitrite: NEGATIVE
Specific Gravity, Urine: 1.01 (ref 1.000–1.030)
Total Protein, Urine: NEGATIVE
Urine Glucose: NEGATIVE
Urobilinogen, UA: 0.2 (ref 0.0–1.0)
pH: 5 (ref 5.0–8.0)

## 2019-05-28 MED ORDER — IOHEXOL 350 MG/ML SOLN
100.0000 mL | Freq: Once | INTRAVENOUS | Status: AC | PRN
  Administered 2019-05-28: 100 mL via INTRAVENOUS
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
## 2019-05-28 NOTE — Progress Notes (Signed)
I agree with the above note, plan 

## 2019-05-28 NOTE — Patient Instructions (Addendum)
If you are age 57 or older, your body mass index should be between 23-30. Your Body mass index is 26.33 kg/m. If this is out of the aforementioned range listed, please consider follow up with your Primary Care Provider.  If you are age 67 or younger, your body mass index should be between 19-25. Your Body mass index is 26.33 kg/m. If this is out of the aformentioned range listed, please consider follow up with your Primary Care Provider.   To help prevent the possible spread of infection to our patients, communities, and staff; we will be implementing the following measures:  As of now we are not allowing any visitors/family members to accompany you to any upcoming appointments with [facility]. If you have any concerns about this please contact our office to discuss prior to the appointment.   You have been scheduled for an endoscopy. Please follow written instructions given to you at your visit today. If you use inhalers (even only as needed), please bring them with you on the day of your procedure. Your physician has requested that you go to www.startemmi.com and enter the access code given to you at your visit today. This web site gives a general overview about your procedure. However, you should still follow specific instructions given to you by our office regarding your preparation for the procedure.  You can use Pepcid, TUMS, etc as needed  Please go to the lab in the basement of our building to have Urinalysis done as you leave today. Hit "B" for basement when you get on the elevator.  When the doors open the lab is on your left.  We will call you with the results. Thank you.  Thank you for entrusting me with your care and for choosing [employer], Alonza Bogus, P.A. - C.

## 2019-05-29 ENCOUNTER — Telehealth: Payer: Self-pay | Admitting: Internal Medicine

## 2019-05-29 ENCOUNTER — Ambulatory Visit: Payer: Self-pay | Attending: Specialist

## 2019-05-29 ENCOUNTER — Telehealth: Payer: Self-pay | Admitting: Gastroenterology

## 2019-05-29 DIAGNOSIS — G8929 Other chronic pain: Secondary | ICD-10-CM | POA: Insufficient documentation

## 2019-05-29 DIAGNOSIS — R252 Cramp and spasm: Secondary | ICD-10-CM | POA: Insufficient documentation

## 2019-05-29 DIAGNOSIS — M546 Pain in thoracic spine: Secondary | ICD-10-CM | POA: Insufficient documentation

## 2019-05-29 DIAGNOSIS — M545 Low back pain: Secondary | ICD-10-CM | POA: Insufficient documentation

## 2019-05-29 NOTE — Telephone Encounter (Signed)
Called patient, advised that results not in yet- but would route to Dr.Hilty and advised he was on vacation but another doctor was covering for him, and we would see if they could take a look. Thanks!

## 2019-05-29 NOTE — Telephone Encounter (Signed)
Notes recorded by Sanda Klein, MD on 05/29/2019 at 3:33 PM EDT  Very mild dilation of the aortic root, probably unchanged since the 2018 study (this study is more precise for size measurements than the calcium score study).  She would like to know if root dilation increased in size and if there are other dilations. Read her verbatim Dr. Lurline Del note and explained that her 2018 calcium score, 2019 echo that also noted dilations are not the same test so the comparison is not "apples to apples" per se. She voiced understanding of this. She wants Dr. [name] to review as well

## 2019-05-29 NOTE — Telephone Encounter (Signed)
Follow Up:     Pt wants to know if her CT results are ready from yesterday please.

## 2019-05-29 NOTE — Telephone Encounter (Signed)
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
## 2019-05-29 NOTE — Patient Instructions (Signed)
Access Code: [email or web address]  URL: [email or web address]  Date: 05/29/2019  Prepared by: Sigurd Sos   Exercises Cat-Camel - 10 reps - 1 sets - 5 hold - 2x daily - 7x weekly Child's Pose Stretch - 3 reps - 2x daily - 7x weekly Child's Pose with Sidebending - 3 reps - 2x daily - 7x weekly Sidelying Open Book Thoracic Rotation with Knee on Foam Roll - 10 reps - 1 sets - 2x daily - 7x weekly

## 2019-05-30 ENCOUNTER — Other Ambulatory Visit: Payer: Self-pay

## 2019-05-30 ENCOUNTER — Ambulatory Visit (AMBULATORY_SURGERY_CENTER): Payer: Self-pay | Admitting: Gastroenterology

## 2019-05-30 ENCOUNTER — Encounter: Payer: Self-pay | Admitting: Gastroenterology

## 2019-05-30 ENCOUNTER — Telehealth: Payer: Self-pay | Admitting: Gastroenterology

## 2019-05-30 VITALS — BP 120/70 | HR 75 | Temp 98.5°F | Resp 13 | Ht 68.0 in | Wt 173.0 lb

## 2019-05-30 DIAGNOSIS — K2950 Unspecified chronic gastritis without bleeding: Secondary | ICD-10-CM

## 2019-05-30 DIAGNOSIS — K2970 Gastritis, unspecified, without bleeding: Secondary | ICD-10-CM

## 2019-05-30 DIAGNOSIS — R1013 Epigastric pain: Secondary | ICD-10-CM

## 2019-05-30 MED ORDER — SODIUM CHLORIDE 0.9 % IV SOLN: 500.0000 mL | Freq: Once | INTRAVENOUS | Status: DC

## 2019-05-30 MED ORDER — OMEPRAZOLE 40 MG PO CPDR: 40.0000 mg | DELAYED_RELEASE_CAPSULE | Freq: Every day | ORAL | 5 refills | Status: DC

## 2019-05-30 MED FILL — OMEPRAZOLE DR 40 MG CAPSULE: 40 | 30 days supply | Qty: 30 | Fill #0

## 2019-05-30 NOTE — Telephone Encounter (Signed)
The patient chart has been corrected.

## 2019-05-30 NOTE — Progress Notes (Signed)
PT taken to PACU. Monitors in place. VSS. Report given to RN. 

## 2019-05-30 NOTE — Op Note (Signed)
[facility] Patient Name: [name] Procedure Date: 05/30/2019 12:54 PM MRN: [id number] Endoscopist: Milus Banister , MD Age: 58 Referring MD:  Date of Birth: November 11, 1960 Gender: Female Account #: [id number] Procedure:                Upper GI endoscopy Indications:              Epigastric abdominal pain, Lower abdominal pain Medicines:                Monitored Anesthesia Care Procedure:                Pre-Anesthesia Assessment:                           - Prior to the procedure, a History and Physical                            was performed, and patient medications and                            allergies were reviewed. The patient's tolerance of                            previous anesthesia was also reviewed. The risks                            and benefits of the procedure and the sedation                            options and risks were discussed with the patient.                            All questions were answered, and informed consent                            was obtained. Prior Anticoagulants: The patient has                            taken no previous anticoagulant or antiplatelet                            agents. ASA Grade Assessment: II - A patient with                            mild systemic disease. After reviewing the risks                            and benefits, the patient was deemed in                            satisfactory condition to undergo the procedure.                           After obtaining informed consent, the endoscope was  passed under direct vision. Throughout the                            procedure, the patient's blood pressure, pulse, and                            oxygen saturations were monitored continuously. The                            Endoscope was introduced through the mouth, and                            advanced to the second part of duodenum. The upper                            GI  endoscopy was accomplished without difficulty.                            The patient tolerated the procedure well. Scope In: Scope Out: Findings:                 Mild inflammation characterized by erythema and                            friability was found in the gastric antrum.                            Biopsies were taken with a cold forceps for                            histology.                           The exam was otherwise without abnormality. Complications:            No immediate complications. Estimated blood loss:                            None. Estimated Blood Loss:     Estimated blood loss: none. Impression:               - Mild, non-specific gastritis. Biopsied to check                            for H. pylori.                           - The examination was otherwise normal. Recommendation:           - Patient has a contact number available for                            emergencies. The signs and symptoms of potential                            delayed complications were discussed with the  patient. Return to normal activities tomorrow.                            Written discharge instructions were provided to the                            patient.                           - Resume previous diet.                           - Stop pepcid and instead start taking omeprazole                            (new script written today 40mg  pills one pill 20-30                            min prior to breakfast meal every day, disp 30 wiht                            5 refills).                           - Await pathology results. Milus Banister, MD 05/30/2019 1:16:53 PM This report has been signed electronically.

## 2019-05-30 NOTE — Patient Instructions (Signed)
YOU HAD AN ENDOSCOPIC PROCEDURE TODAY AT [facility]:   Refer to the procedure report that was given to you for any specific questions about what was found during the examination.  If the procedure report does not answer your questions, please call your gastroenterologist to clarify.  If you requested that your care partner not be given the details of your procedure findings, then the procedure report has been included in a sealed envelope for you to review at your convenience later.  YOU SHOULD EXPECT: Some feelings of bloating in the abdomen. Passage of more gas than usual.  Walking can help get rid of the air that was put into your GI tract during the procedure and reduce the bloating. If you had a lower endoscopy (such as a colonoscopy or flexible sigmoidoscopy) you may notice spotting of blood in your stool or on the toilet paper. If you underwent a bowel prep for your procedure, you may not have a normal bowel movement for a few days.  Please Note:  You might notice some irritation and congestion in your nose or some drainage.  This is from the oxygen used during your procedure.  There is no need for concern and it should clear up in a day or so.  SYMPTOMS TO REPORT IMMEDIATELY:    Following upper endoscopy (EGD)  Vomiting of blood or coffee ground material  New chest pain or pain under the shoulder blades  Painful or persistently difficult swallowing  New shortness of breath  Fever of 100F or higher  Black, tarry-looking stools  Please see handouts given to you on Gastritis.  For urgent or emergent issues, a gastroenterologist can be reached at any hour by calling [phone].  Stop the Pepcid and start Omepra zole 40 mg 20 -30 min prior to breakfast a day.   DIET:  We do recommend a small meal at first, but then you may proceed to your regular diet.  Drink plenty of fluids but you should avoid alcoholic beverages for 24 hours.  ACTIVITY:  You should plan to  take it easy for the rest of today and you should NOT DRIVE or use heavy machinery until tomorrow (because of the sedation medicines used during the test).    FOLLOW UP: Our staff will call the number listed on your records 48-72 hours following your procedure to check on you and address any questions or concerns that you may have regarding the information given to you following your procedure. If we do not reach you, we will leave a message.  We will attempt to reach you two times.  During this call, we will ask if you have developed any symptoms of COVID 19. If you develop any symptoms (ie: fever, flu-like symptoms, shortness of breath, cough etc.) before then, please call [phone].  If you test positive for Covid 19 in the 2 weeks post procedure, please call and report this information to [place].    If any biopsies were taken you will be contacted by phone or by letter within the next 1-3 weeks.  Please call us at [phone] if you have not heard about the biopsies in 3 weeks.    SIGNATURES/CONFIDENTIALITY: You and/or your care partner have signed paperwork which will be entered into your electronic medical record.  These signatures attest to the fact that that the information above on your After Visit Summary has been reviewed and is understood.  Full responsibility of the confidentiality of this discharge information lies  with you and/or your care-partner.  Thank you for letting us take care of your healthcare needs today.

## 2019-05-30 NOTE — Telephone Encounter (Signed)
Pt requested to update her chart: she does not have a cousin with colon cancer; she has a cousin with prostate cancer.

## 2019-05-30 NOTE — Progress Notes (Signed)
Temp taken by WR VS taken by CW 

## 2019-05-30 NOTE — Telephone Encounter (Signed)
Received page to on-call GI regarding abdominal pain after EGD earlier today. EGD only n/f mild non-ulcer gastritis. Bxs pending for H pylori. Pain has actually been present x1 month and was indication for EGD. Slightly worse this evening, but improved with BM/flatus. Asking questions about ongoing w/uof abdominal pain. Plan for conservative management o/n given improvement in pain and will await pending bxs. If unrevealing, may need cross sectional imaging. Can schedule f/u appt in GI Clinic. All questions answered and appreciative of call.

## 2019-05-31 ENCOUNTER — Ambulatory Visit: Payer: Self-pay | Attending: Family Medicine

## 2019-05-31 ENCOUNTER — Telehealth: Payer: Self-pay | Admitting: Nurse Practitioner

## 2019-05-31 ENCOUNTER — Telehealth: Payer: Self-pay | Admitting: Gastroenterology

## 2019-05-31 NOTE — Telephone Encounter (Signed)
Yes, if biopsies show No h. Pylori then CT is the next step.  Thanks

## 2019-05-31 NOTE — Telephone Encounter (Signed)
The pt was called this morning and she tells me that the pain she had last night is now a dull ache on the lower left side.  She says she has the pain after eating and it can be anywhere in her GI tract.  She did eat this morning an hour ago and so far has not had any pain.  She says she has had this pain for several months.

## 2019-05-31 NOTE — Telephone Encounter (Signed)
Thank you for keeping me updated on her as well.  I had mentioned a CT scan if her symptoms continued and EGD was unremarkable.  Dr. Ardis Hughs are you okay with this?  She is a very anxious person.  Thank you,  Jess

## 2019-05-31 NOTE — Telephone Encounter (Signed)
Vito, thanks  [place], Can you check on her this morning?  Thanks

## 2019-05-31 NOTE — Telephone Encounter (Signed)
I called Pt since she had a TELE visit appt. She was inform that is too soon to reapply and I will be sending back all her documents she submit on 05/31/2019

## 2019-05-31 NOTE — Telephone Encounter (Signed)
The pt has been advised that a message was sent to Dr Ardis Hughs for review and as soon as I get recommendations I will call her back.  Pt agreed

## 2019-06-01 ENCOUNTER — Telehealth: Payer: Self-pay

## 2019-06-01 NOTE — Telephone Encounter (Signed)
  Follow up Call-  Call back number 05/30/2019  Post procedure Call Back phone  # [phone]  Permission to leave phone message Yes  Some recent data might be hidden     Patient questions:  Do you have a fever, pain , or abdominal swelling? No. Pain Score  0 *  Have you tolerated food without any problems? Yes.    Have you been able to return to your normal activities? Yes.    Do you have any questions about your discharge instructions: Diet   No. Medications  Yes.   Follow up visit  No.  Do you have questions or concerns about your Care? No.  Actions: * If pain score is 4 or above: No action needed, pain <4.  Pt. States that she was having abdominal pain after her procedure and called Dr. Eugenia Pancoast office and spoke to on call MD.  This morning she states that the pain in her abdomen has subsided and questioned the dose of omeprazole being high.  Advised to contact Dr. Ardis Hughs regarding medication dose and await pathology results to see if H. Pylori is positive.  Pt agreed to all.  1. Have you developed a fever since your procedure? no  2.   Have you had an respiratory symptoms (SOB or cough) since your procedure? no  3.   Have you tested positive for COVID 19 since your procedure no  4.   Have you had any family members/close contacts diagnosed with the COVID 19 since your procedure?  no   If yes to any of these questions please route to Joylene John, RN and Alphonsa Gin, Therapist, sports.

## 2019-06-01 NOTE — Telephone Encounter (Signed)
[name] not sure who called the pt but I see a note from Dr Ardis Hughs about setting up a CT scan.  Do you want me to go ahead with that?  He path was neg for H Pylor.

## 2019-06-01 NOTE — Telephone Encounter (Signed)
Thank is fine.  CT scan of the abdomen and pelvis with contrast.  Thank you,  Jess

## 2019-06-01 NOTE — Telephone Encounter (Signed)
Pt stated that she was "talking to someone about results and was disconnected."

## 2019-06-04 ENCOUNTER — Telehealth: Payer: Self-pay | Admitting: Gastroenterology

## 2019-06-04 NOTE — Telephone Encounter (Signed)
I have reviewed - aortic root is mildly dilated. Not overly concerning at this point. Will monitor.  Dr. Lemmie Evens

## 2019-06-04 NOTE — Telephone Encounter (Signed)
See alternate note  

## 2019-06-05 NOTE — Telephone Encounter (Signed)
The pt was called to set up CT scan and she advised that she feels better now and has no abd pain.  She does not wish to have CT scan at this time.  She will call if her symptoms return.

## 2019-06-05 NOTE — Telephone Encounter (Signed)
Patient called with MD test results interpretation

## 2019-06-05 NOTE — Telephone Encounter (Signed)
I am glad that she is feeling better.  Please save to chart.  Thank you,  Jess

## 2019-06-06 ENCOUNTER — Ambulatory Visit: Payer: No Typology Code available for payment source | Admitting: Nurse Practitioner

## 2019-06-07 NOTE — Telephone Encounter (Signed)
Pt changed her mind and would like to schedule CT.

## 2019-06-08 NOTE — Telephone Encounter (Signed)
I spoke with the pt and she needs to have the CT no later than Monday.  I have placed a call to [facility] and I am waiting a response.  [facility] has no appt for Monday. The pt can not go to [facility] d/t having the [facility].

## 2019-06-08 NOTE — Telephone Encounter (Signed)
Unable to get pt scheduled for Monday.  She will call back to schedule after she gets the [facility] health orange card renewed.

## 2019-06-14 MED FILL — METOPROLOL SUCCINATE ER 25: 25 | 30 days supply | Qty: 45 | Fill #3

## 2019-06-15 ENCOUNTER — Ambulatory Visit: Payer: Self-pay | Admitting: Specialist

## 2019-06-15 ENCOUNTER — Telehealth: Payer: Self-pay

## 2019-06-15 NOTE — Telephone Encounter (Signed)
-----   Message from Loralie Champagne, PA-C sent at 06/15/2019  1:33 PM EDT ----- Chong Sicilian,  Would you mind checking on this patient?  Just be sure she is still no longer having abdominal pain.  I had been keeping her on my radar since I saw her in the office.  Also, we did not really discuss this since this was not an issue that would have been causing her any abdominal pain, but her ultrasound showed a polyp in her gallbladder.  Polyps in the gallbladder are different than polyps in the colon, etc.  We usually just repeat ultrasound in 6 months to ensure stability of the size.  Larger gallbladder polyps can require gallbladder removal, etc.  Right now the plan would just be to repeat ultrasound in 6 months from her last ultrasound if she is willing.  Thank you,  Jess

## 2019-06-15 NOTE — Telephone Encounter (Signed)
I spoke with the pt and she states the pain has subsided and will call back if her symptoms return.  She will have repeat US in 6 months .

## 2019-06-15 NOTE — Telephone Encounter (Signed)
Staff message to myself to call pt in 6 months for [place]

## 2019-06-15 NOTE — Telephone Encounter (Signed)
Thank you.  Please put in orders or recall for this if you do not mind.  Thank you,  Jess

## 2019-06-18 ENCOUNTER — Telehealth: Payer: Self-pay | Admitting: Gastroenterology

## 2019-06-18 ENCOUNTER — Ambulatory Visit: Payer: No Typology Code available for payment source | Admitting: Nurse Practitioner

## 2019-06-18 DIAGNOSIS — R140 Abdominal distension (gaseous): Secondary | ICD-10-CM

## 2019-06-18 DIAGNOSIS — R1013 Epigastric pain: Secondary | ICD-10-CM

## 2019-06-18 DIAGNOSIS — R109 Unspecified abdominal pain: Secondary | ICD-10-CM

## 2019-06-18 NOTE — Telephone Encounter (Signed)
     The pt states that she would like to go ahead and set up CT scan at this time.    You have been scheduled for a CT scan of the abdomen and pelvis at [facility] ([address]---this is in the same building as Press [facility]).   You are scheduled on 06/25/19 at 3 pm. You should arrive 15 minutes prior to your appointment time for registration. Please follow the written instructions below on the day of your exam:  WARNING: IF YOU ARE ALLERGIC TO IODINE/X-RAY DYE, PLEASE NOTIFY RADIOLOGY IMMEDIATELY AT [phone]! YOU WILL BE GIVEN A 13 HOUR PREMEDICATION PREP.  1) Do not eat or drink anything after 11 am (4 hours prior to your test) 2) You have been given 2 bottles of oral contrast to drink. The solution may taste better if refrigerated, but do NOT add ice or any other liquid to this solution. Shake well before drinking.    Drink 1 bottle of contrast @ 1 pm (2 hours prior to your exam)  Drink 1 bottle of contrast @ 2 pm (1 hour prior to your exam)  You may take any medications as prescribed with a small amount of water, if necessary. If you take any of the following medications: METFORMIN, GLUCOPHAGE, GLUCOVANCE, AVANDAMET, RIOMET, FORTAMET, [place] MET, JANUMET, GLUMETZA or METAGLIP, you MAY be asked to HOLD this medication 48 hours AFTER the exam.  The purpose of you drinking the oral contrast is to aid in the visualization of your intestinal tract. The contrast solution may cause some diarrhea. Depending on your individual set of symptoms, you may also receive an intravenous injection of x-ray contrast/dye. Plan on being at [facility] for 30 minutes or longer, depending on the type of exam you are having performed.  This test typically takes 30-45 minutes to complete.  If you have any questions regarding your exam or if you need to reschedule, you may call the CT department at [phone] between the hours of 8:00 am and 5:00 pm,  Monday-Friday.  ___________________________________ The pt has been notified via My Chart per pt request

## 2019-06-22 ENCOUNTER — Other Ambulatory Visit: Payer: Self-pay

## 2019-06-22 ENCOUNTER — Ambulatory Visit: Payer: Self-pay | Attending: Family Medicine

## 2019-06-25 ENCOUNTER — Ambulatory Visit (INDEPENDENT_AMBULATORY_CARE_PROVIDER_SITE_OTHER)
Admission: RE | Admit: 2019-06-25 | Discharge: 2019-06-25 | Disposition: A | Discharge status: Home / Self Care | Payer: Self-pay | Source: Ambulatory Visit | Attending: Gastroenterology | Admitting: Gastroenterology

## 2019-06-25 ENCOUNTER — Other Ambulatory Visit: Payer: Self-pay

## 2019-06-25 DIAGNOSIS — R109 Unspecified abdominal pain: Secondary | ICD-10-CM

## 2019-06-25 DIAGNOSIS — R140 Abdominal distension (gaseous): Secondary | ICD-10-CM

## 2019-06-25 DIAGNOSIS — R1013 Epigastric pain: Secondary | ICD-10-CM

## 2019-06-25 MED ORDER — IOHEXOL 300 MG/ML  SOLN
100.0000 mL | Freq: Once | INTRAMUSCULAR | Status: AC | PRN
  Administered 2019-06-25: 100 mL via INTRAVENOUS

## 2019-06-27 ENCOUNTER — Telehealth: Payer: Self-pay | Admitting: Nurse Practitioner

## 2019-06-27 NOTE — Telephone Encounter (Signed)
Patient called to get her CT results please follow up.

## 2019-06-28 NOTE — Telephone Encounter (Signed)
Follow up  Pt called states she has two lesions that have change and the nurse that did the CT is recommending an MRI. Please f/u with patient is very concerned

## 2019-06-28 NOTE — Telephone Encounter (Signed)
Patient has been notified on her CT scan result.

## 2019-06-28 NOTE — Telephone Encounter (Signed)
Patient also stated that she is still having cramping and stomach pain. She stated the Gastroenterologist refer her to back PCP due to there is no finding for her stomach pain.

## 2019-06-28 NOTE — Telephone Encounter (Signed)
Will route to PCP regarding renal MRI.

## 2019-06-29 ENCOUNTER — Ambulatory Visit (INDEPENDENT_AMBULATORY_CARE_PROVIDER_SITE_OTHER): Payer: Self-pay

## 2019-06-29 ENCOUNTER — Telehealth: Payer: Self-pay | Admitting: Specialist

## 2019-06-29 ENCOUNTER — Telehealth: Payer: Self-pay | Admitting: Gastroenterology

## 2019-06-29 ENCOUNTER — Encounter: Payer: Self-pay | Admitting: Specialist

## 2019-06-29 ENCOUNTER — Ambulatory Visit (INDEPENDENT_AMBULATORY_CARE_PROVIDER_SITE_OTHER): Payer: Self-pay | Admitting: Specialist

## 2019-06-29 VITALS — BP 95/64 | HR 65 | Ht 68.0 in | Wt 171.0 lb

## 2019-06-29 DIAGNOSIS — M545 Low back pain, unspecified: Secondary | ICD-10-CM

## 2019-06-29 DIAGNOSIS — M25552 Pain in left hip: Secondary | ICD-10-CM

## 2019-06-29 DIAGNOSIS — M47814 Spondylosis without myelopathy or radiculopathy, thoracic region: Secondary | ICD-10-CM

## 2019-06-29 DIAGNOSIS — M5136 Other intervertebral disc degeneration, lumbar region: Secondary | ICD-10-CM

## 2019-06-29 NOTE — Telephone Encounter (Signed)
Forwarding to Dr. Wynetta Emery and Dr. Margarita Rana as I am not in the office today

## 2019-06-29 NOTE — Telephone Encounter (Signed)
Patient would like to speak with Alyse Low about her GI appointment. Her call back number is [phone]. Thanks.

## 2019-06-29 NOTE — Telephone Encounter (Signed)
Yes I did speak with him.  He does not feel that any of her abdominal discomforts are orthopedic, spinal related.  I am not sure what is causing them either.  Please offer her my first available return office visit to discuss the pains further, consider other testing.  Thanks

## 2019-06-29 NOTE — Telephone Encounter (Signed)
Dr Ardis Hughs did you speak with Dr Louanne Skye about this pt?  She is calling stating she was told to call and ask for me.  Not sure what she needs.

## 2019-06-29 NOTE — Telephone Encounter (Signed)
Patient called stated she would like to get an update in regards to her MRI states she has a bleeding kidney and this is an urgent matter. Please follow up.
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
## 2019-06-29 NOTE — Patient Instructions (Signed)
Avoid frequent bending and stooping  No lifting greater than 10 lbs. May use ice or moist heat for pain. Weight loss is of benefit. Best medication for lumbar disc disease is arthritis medications like motrin, celebrex and naprosyn.But with diverticular disease you should avoid use of the arthritis medications.  Exercise is important to improve your indurance and does allow people to function better inspite of back pain. PT for core strengthening and improving posture and strength in the lower extremities.

## 2019-06-29 NOTE — Telephone Encounter (Signed)
Pt calling regarding x-ray that she had done today with Dr. Louanne Skye. Pt states that something was found regarding her diverticulosis, Dr. Louanne Skye spoke with Dr. Ardis Hughs and she was instructed to call his nurse to speak about these findings.

## 2019-06-29 NOTE — Telephone Encounter (Signed)
The pt has been scheduled for 10/5 at 11 am with Dr Ardis Hughs to discuss.

## 2019-06-29 NOTE — Telephone Encounter (Signed)
Pt.notified

## 2019-06-30 ENCOUNTER — Telehealth: Payer: Self-pay | Admitting: Gastroenterology

## 2019-06-30 NOTE — Telephone Encounter (Signed)
Received page to on-call GI regarding ongoing abdominal pain. I was not in front of a computer at the time of her call. I reviewed her electronic health record after our discussion and it supports her timeline and reported symptoms.  Continues to have lower abdominal pain and bloating that has been under evaluation by Alonza Bogus and Dr. Ardis Hughs. No change in symptoms but concerned because she does not yet have any answers. No alarm features. Next appointment with Dr. Ardis Hughs is not until 07/30/19 and she doesn't feel that she can wait until then.  Recent evaluation included ultrasound, EGD, and CT scan. She has normal liver enzymes, lipase, and CBC 05/22/19.  Not experiencing any specific relief from Protonix and then omeprazole.  Worried because CT scan showed lesions in the kidney that need further evaluation and that an MRI has not yet been schedule because she thought the renal lesions may explain her pain.  Noted concerns that contrast was seen in her diverticulosis on recent x-rays that were obtained to evaluate lower back pain. She understand that Dr. Ardis Hughs spoke with  Dr. Louanne Skye but was confused by his interpretation/recommendations.  She is also specifically concerned that she may need a colonoscopy and that this has been overlooked on recent interactions with our practice.   She is primarily looking for additional guidance about further evaluation. I recommended continued conservative management over the weekend and told her that we would ask for Dr. Eugenia Pancoast input after the Labor Day holiday. She will call with any additional questions or concerns prior to that time.   She was appreciative of the call and hopeful that she would not have to wait until 07/30/19.

## 2019-07-02 NOTE — Telephone Encounter (Signed)
This was ordered by GI. She needs to follow up with them.

## 2019-07-03 ENCOUNTER — Other Ambulatory Visit: Payer: Self-pay

## 2019-07-03 DIAGNOSIS — N289 Disorder of kidney and ureter, unspecified: Secondary | ICD-10-CM

## 2019-07-03 NOTE — Telephone Encounter (Signed)
lmom for her to call me back.

## 2019-07-03 NOTE — Telephone Encounter (Signed)
You have been scheduled for an MRI at [facility] on 07/17/19. Your appointment time is 3 pm. Please arrive 15 minutes prior to your appointment time for registration purposes. Please make certain not to have anything to eat or drink 6 hours prior to your test. In addition, if you have any metal in your body, have a pacemaker or defibrillator, please be sure to let your ordering physician know. This test typically takes 45 minutes to 1 hour to complete. Should you need to reschedule, please call [phone] to do so.

## 2019-07-03 NOTE — Telephone Encounter (Signed)
Follow up   Pt states she no longer needs the MRI order

## 2019-07-03 NOTE — Telephone Encounter (Signed)
The pt has been advised of pre visit and colon appt as well as MRI appt.  She was instructed and all questions answered.

## 2019-07-03 NOTE — Telephone Encounter (Signed)
**  FYI** Could not get in to see Dr. Kern Alberta October 2020, however she did speak with the on-call provider for his office this past weekend due to her having increased pain, and then she was able to speak with Dr. Ardis Hughs over the phone and they are going to setup a Colonoscopy and an MRI of her Kidney's also. She just wanted to let Dr. Louanne Skye know this.

## 2019-07-03 NOTE — Telephone Encounter (Signed)
We spoke. Her lower abd pains are somewhat improved after liquid diet the past couple days.  I offered a colonoscopy to further investigate the pains and she would like to do that, sometime next week.  Also I offered to order the MRI, follow up of right renal lesions   Patty, She needs colonoscopy ([place], next week) and MRI of abd (follow up right renal lesion). Thanks

## 2019-07-05 ENCOUNTER — Ambulatory Visit (AMBULATORY_SURGERY_CENTER): Payer: Self-pay

## 2019-07-05 ENCOUNTER — Telehealth: Payer: Self-pay | Admitting: Nurse Practitioner

## 2019-07-05 ENCOUNTER — Other Ambulatory Visit: Payer: Self-pay

## 2019-07-05 VITALS — Ht 68.0 in | Wt 166.8 lb

## 2019-07-05 DIAGNOSIS — R109 Unspecified abdominal pain: Secondary | ICD-10-CM

## 2019-07-05 MED ORDER — PEG 3350-KCL-NA BICARB-NACL 420 G PO SOLR: 4000.0000 mL | Freq: Once | ORAL | 0 refills | Status: AC

## 2019-07-05 MED FILL — GOLYTELY SOLUTION: 236 | 1 days supply | Qty: 4000 | Fill #0

## 2019-07-05 NOTE — Telephone Encounter (Signed)
Pt call to find if she was approve for CAFA, she was informed that she is an the approval was for 75% discount, she was a little upset-, she was inform that here at [facility] does not made the decision of how much the Pt can qualified, Pt will be calling the customer service to see if the can over turn that decision

## 2019-07-05 NOTE — Progress Notes (Signed)
Denies allergies to eggs or soy products. Denies complication of anesthesia or sedation. Denies use of weight loss medication. Denies use of O2.   Emmi instructions given for colonoscopy.  

## 2019-07-09 ENCOUNTER — Telehealth: Payer: Self-pay | Admitting: Gastroenterology

## 2019-07-09 NOTE — Telephone Encounter (Signed)
Do you now or have you had a fever in the last 14 days?    No ° °  ° °Do you have any respiratory symptoms of shortness of breath or cough now or in the last 14 days?   No ° °  ° °Do you have any family members or close contacts with diagnosed or suspected Covid-19 in the past 14 days?   No  ° °  ° °Have you been tested for Covid-19 and found to be positive?   No ° °  ° °Pt made aware to check in on theth floor and that care partner may wait in the car or come up to the lobby during the procedure but will need to provide their own mask. °

## 2019-07-09 NOTE — Telephone Encounter (Signed)
Pt called about her prep- she wanted to know why it was not the bottles like before-- I explained to her this is his prep of choice - that she can drink the Golyt[place] thru a straw , add a little crystal light lemonade to each 8 oz to further flavor it - she verbalized understanding

## 2019-07-10 ENCOUNTER — Ambulatory Visit (AMBULATORY_SURGERY_CENTER): Payer: Self-pay | Admitting: Gastroenterology

## 2019-07-10 ENCOUNTER — Other Ambulatory Visit: Payer: Self-pay

## 2019-07-10 ENCOUNTER — Encounter: Payer: Self-pay | Admitting: Gastroenterology

## 2019-07-10 VITALS — BP 100/69 | HR 68 | Temp 97.9°F | Resp 11 | Ht 68.0 in | Wt 166.8 lb

## 2019-07-10 DIAGNOSIS — K5730 Diverticulosis of large intestine without perforation or abscess without bleeding: Secondary | ICD-10-CM

## 2019-07-10 DIAGNOSIS — R1084 Generalized abdominal pain: Secondary | ICD-10-CM

## 2019-07-10 DIAGNOSIS — K648 Other hemorrhoids: Secondary | ICD-10-CM

## 2019-07-10 MED ORDER — SODIUM CHLORIDE 0.9 % IV SOLN: 500.0000 mL | Freq: Once | INTRAVENOUS | Status: DC

## 2019-07-10 MED ORDER — HYOSCYAMINE SULFATE 0.125 MG SL SUBL: SUBLINGUAL_TABLET | SUBLINGUAL | 3 refills | Status: DC

## 2019-07-10 MED FILL — HYOSCYAMINE SULF 0.125 MG T: 0.125 | 6 days supply | Qty: 50 | Fill #0

## 2019-07-10 NOTE — Progress Notes (Signed)
Report given to PACU, vss 

## 2019-07-10 NOTE — Progress Notes (Signed)
Pt's states no medical or surgical changes since previsit or office visit. 

## 2019-07-10 NOTE — Patient Instructions (Signed)
Handouts given for diverticulosis and hemorrhoids.  Refer to Procedure report, (last page) for Dr. Eugenia Pancoast recommendations and follow up care.  Pick up your new prescription today.  YOU HAD AN ENDOSCOPIC PROCEDURE TODAY AT [facility]:   Refer to the procedure report that was given to you for any specific questions about what was found during the examination.  If the procedure report does not answer your questions, please call your gastroenterologist to clarify.  If you requested that your care partner not be given the details of your procedure findings, then the procedure report has been included in a sealed envelope for you to review at your convenience later.  YOU SHOULD EXPECT: Some feelings of bloating in the abdomen. Passage of more gas than usual.  Walking can help get rid of the air that was put into your GI tract during the procedure and reduce the bloating. If you had a lower endoscopy (such as a colonoscopy or flexible sigmoidoscopy) you may notice spotting of blood in your stool or on the toilet paper. If you underwent a bowel prep for your procedure, you may not have a normal bowel movement for a few days.  Please Note:  You might notice some irritation and congestion in your nose or some drainage.  This is from the oxygen used during your procedure.  There is no need for concern and it should clear up in a day or so.  SYMPTOMS TO REPORT IMMEDIATELY:   Following lower endoscopy (colonoscopy or flexible sigmoidoscopy):  Excessive amounts of blood in the stool  Significant tenderness or worsening of abdominal pains  Swelling of the abdomen that is new, acute  Fever of 100F or higher  For urgent or emergent issues, a gastroenterologist can be reached at any hour by calling [phone].   DIET:  We do recommend a small meal at first, but then you may proceed to your regular diet.  Drink plenty of fluids but you should avoid alcoholic beverages for 24  hours.  ACTIVITY:  You should plan to take it easy for the rest of today and you should NOT DRIVE or use heavy machinery until tomorrow (because of the sedation medicines used during the test).    FOLLOW UP: Our staff will call the number listed on your records 48-72 hours following your procedure to check on you and address any questions or concerns that you may have regarding the information given to you following your procedure. If we do not reach you, we will leave a message.  We will attempt to reach you two times.  During this call, we will ask if you have developed any symptoms of COVID 19. If you develop any symptoms (ie: fever, flu-like symptoms, shortness of breath, cough etc.) before then, please call [phone].  If you test positive for Covid 19 in the 2 weeks post procedure, please call and report this information to [place].    If any biopsies were taken you will be contacted by phone or by letter within the next 1-3 weeks.  Please call us at [phone] if you have not heard about the biopsies in 3 weeks.    SIGNATURES/CONFIDENTIALITY: You and/or your care partner have signed paperwork which will be entered into your electronic medical record.  These signatures attest to the fact that that the information above on your After Visit Summary has been reviewed and is understood.  Full responsibility of the confidentiality of this discharge information lies with you and/or your  care-partner.

## 2019-07-10 NOTE — Progress Notes (Signed)
Temperature taken by A.M, VS taken by C.W.

## 2019-07-10 NOTE — Op Note (Signed)
[facility] Patient Name: [name] Procedure Date: 07/10/2019 8:26 AM MRN: [id number] Endoscopist: Milus Banister , MD Age: 57 Referring MD:  Date of Birth: 07-25-1961 Gender: Female Account #: [id number] Procedure:                Colonoscopy Indications:              Epigastric abdominal pain, Generalized abdominal                            pain, Lower abdominal pain Medicines:                Monitored Anesthesia Care Procedure:                Pre-Anesthesia Assessment:                           - Prior to the procedure, a History and Physical                            was performed, and patient medications and                            allergies were reviewed. The patient's tolerance of                            previous anesthesia was also reviewed. The risks                            and benefits of the procedure and the sedation                            options and risks were discussed with the patient.                            All questions were answered, and informed consent                            was obtained. Prior Anticoagulants: The patient has                            taken no previous anticoagulant or antiplatelet                            agents. ASA Grade Assessment: II - A patient with                            mild systemic disease. After reviewing the risks                            and benefits, the patient was deemed in                            satisfactory condition to undergo the procedure.  After obtaining informed consent, the colonoscope                            was passed under direct vision. Throughout the                            procedure, the patient's blood pressure, pulse, and                            oxygen saturations were monitored continuously. The                            Colonoscope was introduced through the anus and                            advanced to the the terminal  ileum. The colonoscopy                            was performed without difficulty. The patient                            tolerated the procedure well. The quality of the                            bowel preparation was good. The terminal ileum,                            ileocecal valve, appendiceal orifice, and rectum                            were photographed. Scope In: 8:28:52 AM Scope Out: 8:42:35 AM Scope Withdrawal Time: 0 hours 8 minutes 50 seconds  Total Procedure Duration: 0 hours 13 minutes 43 seconds  Findings:                 Multiple small and large-mouthed diverticula were                            found in the left colon.                           The terminal ileum appeared normal.                           Internal hemorrhoids.                           The exam was otherwise without abnormality on                            direct and retroflexion views. Complications:            No immediate complications. Estimated blood loss:                            None. Estimated Blood Loss:  Estimated blood loss: none. Impression:               - Diverticulosis in the left colon.                           - The examined portion of the ileum was normal.                           - Internal hemorrhoids.                           - The examination was otherwise normal on direct                            and retroflexion views.                           - No polyps or cancers. Recommendation:           - Patient has a contact number available for                            emergencies. The signs and symptoms of potential                            delayed complications were discussed with the                            patient. Return to normal activities tomorrow.                            Written discharge instructions were provided to the                            patient.                           - Resume previous diet.                           - Continue  present medications.                           - Repeat colonoscopy in 10 years for screening                            purposes.                           - My office will arrange referral to [facility] to                            discuss your symptoms and GB polyp, consider                            cholecystectomy.                           -  Trial of levsin 0.125 SL tabs, one to two pills                            every 6-8 hours PRN abd pains (dispense 50 with 3                            refills). These antispasm meds can harden your                            stools a bit, if you notice that is happening and                            causing constipation please start also taking                            miralax powder one dose once daily (OTC).                           - Await results of next week MRI that is already                            scheduled. Milus Banister, MD 07/10/2019 8:51:08 AM This report has been signed electronically.

## 2019-07-12 ENCOUNTER — Telehealth: Payer: Self-pay

## 2019-07-12 NOTE — Telephone Encounter (Signed)
  Follow up Call-  Call back number 07/10/2019 05/30/2019  Post procedure Call Back phone  # [phone]  Permission to leave phone message Yes Yes  Some recent data might be hidden     Patient questions:  Do you have a fever, pain , or abdominal swelling? No. Pain Score  0 *  Have you tolerated food without any problems? Yes.    Have you been able to return to your normal activities? Yes.    Do you have any questions about your discharge instructions: Diet   No. Medications  No. Follow up visit  No.  Do you have questions or concerns about your Care? No.  Actions: * If pain score is 4 or above: No action needed, pain <4. 1. Have you developed a fever since your procedure? no  2.   Have you had an respiratory symptoms (SOB or cough) since your procedure? no  3.   Have you tested positive for COVID 19 since your procedure no  4.   Have you had any family members/close contacts diagnosed with the COVID 19 since your procedure?  no   If yes to any of these questions please route to Joylene John, RN and Alphonsa Gin, Therapist, sports.

## 2019-07-13 ENCOUNTER — Telehealth: Payer: Self-pay | Admitting: Gastroenterology

## 2019-07-13 ENCOUNTER — Telehealth: Payer: Self-pay | Admitting: Internal Medicine

## 2019-07-13 NOTE — Telephone Encounter (Signed)
Returned call to patient of Dr. [name] She had an episode of tachycardia but no AFib last night Before she went to bed, HR was 57bpm and BP was 105/69 She was "overly tired" and went to sleep earlier than normal and ate supper late She reports if she eats supper too late and goes to bed too early, the faster HR will wake her up Her brother, a nurse, told her to deep breath and she was able to slow HR down to normal She felt uneasy after this episode, like a "panic attack" and "shaky" but not dizzy or short of breath This has happened 3x in last 6 months  She has been under a lot of stress recently, had to move unexpectedly in a 30 day period Patient has also been experiencing unexplained abdominal pain and her tests thus far have not shown anything other than diverticulosis and kidney cyst/lesion - this is stressing her out   She does not take anti-anxiety medication but reports this has worked for her in the past. She states Dr. [name] did not want her to take anxiety medication and he does not Rx this  Will send to MD to review and advise

## 2019-07-13 NOTE — Telephone Encounter (Signed)
Patient c/o Palpitations:  High priority if patient c/o lightheadedness, shortness of breath, or chest pain  1) How long have you had palpitations/irregular HR/ Afib? Are you having the symptoms now?  Last night -fast heart beat that wakes her up- before she went to bed her heart rate was 57  2) Are you currently experiencing lightheadedness, SOB or CP? Felt like a panic attack  3) Do you have a history of afib (atrial fibrillation) or irregular heart rhythm? * yes, history of Afib  4) Have you checked your BP or HR? (document readings if available): blood pressure before episode was 105/69   5) Are you experiencing any other symptoms? no

## 2019-07-13 NOTE — Telephone Encounter (Signed)
[name] this is a cardiology note can you route this back to them and start a new note for [place]. Thank you

## 2019-07-16 ENCOUNTER — Ambulatory Visit: Payer: Self-pay

## 2019-07-16 ENCOUNTER — Telehealth (HOSPITAL_BASED_OUTPATIENT_CLINIC_OR_DEPARTMENT_OTHER): Payer: Self-pay | Admitting: Nurse Practitioner

## 2019-07-16 DIAGNOSIS — F411 Generalized anxiety disorder: Secondary | ICD-10-CM

## 2019-07-16 MED ORDER — HYDROXYZINE HCL 25 MG PO TABS: 25.0000 mg | ORAL_TABLET | Freq: Three times a day (TID) | ORAL | 0 refills | Status: DC | PRN

## 2019-07-16 NOTE — Telephone Encounter (Signed)
The patient has been notified that [facility] has received the referral and will call with an appt after reviewing the information
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
## 2019-07-16 NOTE — Telephone Encounter (Signed)
Thanks .. if there is an anxiety issue, I recommended she contact her PCP about this. I did not advise against anti-anxiety medications, if they are needed, rather, I told her that I do not prescribe benzodiazepines.  Dr Lemmie Evens

## 2019-07-16 NOTE — Telephone Encounter (Signed)
Patient aware of MD recommendations/advice. She reports her PCP does not Rx anxiolytics but may consult another provider.

## 2019-07-17 ENCOUNTER — Other Ambulatory Visit: Payer: Self-pay

## 2019-07-17 ENCOUNTER — Ambulatory Visit (HOSPITAL_COMMUNITY)
Admission: RE | Admit: 2019-07-17 | Discharge: 2019-07-17 | Disposition: A | Discharge status: Home / Self Care | Payer: Self-pay | Source: Ambulatory Visit | Attending: Gastroenterology | Admitting: Gastroenterology

## 2019-07-17 DIAGNOSIS — N289 Disorder of kidney and ureter, unspecified: Secondary | ICD-10-CM | POA: Insufficient documentation

## 2019-07-17 MED ORDER — GADOBUTROL 1 MMOL/ML IV SOLN
10.0000 mL | Freq: Once | INTRAVENOUS | Status: AC | PRN
  Administered 2019-07-17: 7.5 mL via INTRAVENOUS

## 2019-07-18 ENCOUNTER — Encounter: Payer: Self-pay | Admitting: Nurse Practitioner

## 2019-07-19 ENCOUNTER — Telehealth: Payer: Self-pay | Admitting: Gastroenterology

## 2019-07-19 NOTE — Telephone Encounter (Signed)
The pt has been advised that the MRI has not been reviewed by Dr Ardis Hughs and as soon as he has had a chance to review she will be called with results

## 2019-07-20 ENCOUNTER — Telehealth: Payer: Self-pay | Admitting: Specialist

## 2019-07-20 NOTE — Telephone Encounter (Signed)
Patient called requesting for you to call her back so that she can give Dr. Louanne Skye the results of her GI MRI.  She stated that Dr. Louanne Skye wanted to know the results.  CB#[phone].  Thank you.

## 2019-07-23 NOTE — Telephone Encounter (Signed)
Patient called requesting for you to call her back so that she can give Dr. Louanne Skye the results of her GI MRI.  She stated that Dr. Louanne Skye wanted to know the results.---MRI Results are in her chart

## 2019-07-30 ENCOUNTER — Encounter: Payer: Self-pay | Admitting: Gastroenterology

## 2019-07-30 ENCOUNTER — Ambulatory Visit (INDEPENDENT_AMBULATORY_CARE_PROVIDER_SITE_OTHER): Payer: Self-pay | Admitting: Gastroenterology

## 2019-07-30 ENCOUNTER — Other Ambulatory Visit: Payer: Self-pay

## 2019-07-30 VITALS — BP 102/62 | HR 79 | Temp 97.4°F | Ht 68.0 in | Wt 168.3 lb

## 2019-07-30 DIAGNOSIS — R1084 Generalized abdominal pain: Secondary | ICD-10-CM

## 2019-07-30 NOTE — Patient Instructions (Addendum)
   If you are age 57 or younger, your body mass index should be between 19-25. Your Body mass index is 25.59 kg/m. If this is out of the aformentioned range listed, please consider follow up with your Primary Care Provider.     Follow -up as needed.   Thank you for choosing me and [facility].  Dr.Jacobs
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
## 2019-08-06 ENCOUNTER — Other Ambulatory Visit: Payer: Self-pay

## 2019-08-06 DIAGNOSIS — Z20822 Contact with and (suspected) exposure to covid-19: Secondary | ICD-10-CM

## 2019-08-07 LAB — NOVEL CORONAVIRUS, NAA: SARS-CoV-2, NAA: NOT DETECTED

## 2019-08-29 ENCOUNTER — Encounter: Payer: Self-pay | Admitting: Specialist

## 2019-08-29 ENCOUNTER — Ambulatory Visit (INDEPENDENT_AMBULATORY_CARE_PROVIDER_SITE_OTHER): Payer: Self-pay | Admitting: Specialist

## 2019-08-29 VITALS — BP 111/71 | HR 64 | Ht 68.0 in | Wt 170.0 lb

## 2019-08-29 DIAGNOSIS — M4316 Spondylolisthesis, lumbar region: Secondary | ICD-10-CM

## 2019-08-29 DIAGNOSIS — M48061 Spinal stenosis, lumbar region without neurogenic claudication: Secondary | ICD-10-CM
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
## 2019-08-29 NOTE — Patient Instructions (Addendum)
Avoid frequent bending and stooping  No lifting greater than 10 lbs. May use ice or moist heat for pain. Weight loss is of benefit. Best medication for lumbar disc disease is arthritis medications like extra strength tylenol. Exercise is important to improve your indurance and does allow people to function better inspite of back pain. I recommend PT with [facility] out pt Pt.   Hemp CBD capsules, [email or web address] 5,000-7,000 mg per bottle, 60 capsules per bottle, take one capsule twice a day.  Follow-Up Instructions: No follow-ups on file.

## 2019-09-11 ENCOUNTER — Other Ambulatory Visit: Payer: Self-pay

## 2019-09-11 DIAGNOSIS — Z20822 Contact with and (suspected) exposure to covid-19: Secondary | ICD-10-CM

## 2019-09-12 LAB — NOVEL CORONAVIRUS, NAA: SARS-CoV-2, NAA: NOT DETECTED

## 2019-10-01 ENCOUNTER — Telehealth: Payer: Self-pay | Admitting: Internal Medicine

## 2019-10-01 MED ORDER — METOPROLOL SUCCINATE ER 25 MG PO TB24: 12.5000 mg | ORAL_TABLET | Freq: Every day | ORAL | 1 refills | Status: DC

## 2019-10-01 MED FILL — ?METOPROLOL SUCC ER 25MG TA: 25 | 30 days supply | Qty: 15 | Fill #0

## 2019-10-01 NOTE — Addendum Note (Signed)
Addended by: Harrington Challenger on: 10/01/2019 03:48 PM   Modules accepted: Orders

## 2019-10-01 NOTE — Telephone Encounter (Signed)
New Message   Patient does not have any refills on metoprolol succinate (TOPROL-XL) 12.5 mg TB24 24 hr tablet, please send a new prescription to [place], [place]. [address]

## 2019-10-03 ENCOUNTER — Other Ambulatory Visit: Payer: Self-pay

## 2019-10-31 MED FILL — METOPROLOL SUCCINATE ER 25: 25 | 30 days supply | Qty: 15 | Fill #1

## 2019-11-07 ENCOUNTER — Encounter: Payer: Self-pay | Admitting: Internal Medicine

## 2019-11-07 ENCOUNTER — Ambulatory Visit (INDEPENDENT_AMBULATORY_CARE_PROVIDER_SITE_OTHER): Payer: Self-pay | Admitting: Internal Medicine

## 2019-11-07 ENCOUNTER — Other Ambulatory Visit: Payer: Self-pay

## 2019-11-07 VITALS — BP 125/77 | HR 66 | Ht 68.5 in | Wt 170.8 lb

## 2019-11-07 DIAGNOSIS — I77810 Thoracic aortic ectasia: Secondary | ICD-10-CM

## 2019-11-07 DIAGNOSIS — I341 Nonrheumatic mitral (valve) prolapse: Secondary | ICD-10-CM

## 2019-11-07 DIAGNOSIS — I480 Paroxysmal atrial fibrillation: Secondary | ICD-10-CM

## 2019-11-07 MED ORDER — METOPROLOL SUCCINATE ER 25 MG PO TB24: 12.5000 mg | ORAL_TABLET | Freq: Every day | ORAL | 3 refills | Status: DC

## 2019-11-07 NOTE — Patient Instructions (Signed)
Medication Instructions:  NO CHANGES *If you need a refill on your cardiac medications before your next appointment, please call your pharmacy*  Lab Work: NONE If you have labs (blood work) drawn today and your tests are completely normal, you will receive your results only by: . MyChart Message (if you have MyChart) OR . A paper copy in the mail If you have any lab test that is abnormal or we need to change your treatment, we will call you to review the results.    Follow-Up: At [facility], you and your health needs are our priority.  As part of our continuing mission to provide you with exceptional heart care, we have created designated Provider Care Teams.  These Care Teams include your primary Cardiologist (physician) and Advanced Practice Providers (APPs -  Physician Assistants and Nurse Practitioners) who all work together to provide you with the care you need, when you need it.  Your next appointment:   12 month(s)  The format for your next appointment:   Either In Person or Virtual  Provider:   K. Chad Hilty, MD     
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
## 2019-11-09 ENCOUNTER — Other Ambulatory Visit: Payer: Self-pay

## 2019-11-14 ENCOUNTER — Other Ambulatory Visit: Payer: Self-pay

## 2019-11-16 ENCOUNTER — Ambulatory Visit: Payer: HRSA Program | Attending: Internal Medicine

## 2019-11-16 DIAGNOSIS — Z20822 Contact with and (suspected) exposure to covid-19: Secondary | ICD-10-CM | POA: Insufficient documentation

## 2019-11-17 LAB — NOVEL CORONAVIRUS, NAA: SARS-CoV-2, NAA: NOT DETECTED

## 2019-12-05 MED FILL — METOPROLOL SUCCINATE ER 25: 25 | 30 days supply | Qty: 15 | Fill #2

## 2019-12-17 ENCOUNTER — Telehealth: Payer: Self-pay

## 2019-12-17 DIAGNOSIS — K824 Cholesterolosis of gallbladder: Secondary | ICD-10-CM

## 2019-12-17 NOTE — Telephone Encounter (Signed)
The pt has been advised that she is due for repeat US to look at the gallbladder to eval previous gallbladder polyp seen per Alonza Bogus.   You have been scheduled for an abdominal ultrasound at [facility] (1st floor of hospital) on 12/21/19 at 8 am. Please arrive 15 minutes prior to your appointment for registration. Make certain not to have anything to eat or drink 6 hours prior to your appointment. Should you need to reschedule your appointment, please contact radiology at [phone]. This test typically takes about 30 minutes to perform.  The pt has been advised via My Chart

## 2019-12-17 NOTE — Telephone Encounter (Signed)
-----   Message from Timothy Lasso, RN sent at 06/15/2019  2:36 PM EDT ----- Pt needs repeat US

## 2019-12-21 ENCOUNTER — Ambulatory Visit (HOSPITAL_COMMUNITY): Payer: Self-pay

## 2019-12-31 MED FILL — METOPROLOL SUCCINATE ER 25: 25 | 30 days supply | Qty: 15 | Fill #3

## 2020-01-17 ENCOUNTER — Other Ambulatory Visit: Payer: Self-pay

## 2020-01-17 ENCOUNTER — Ambulatory Visit: Payer: Self-pay

## 2020-01-18 ENCOUNTER — Encounter: Payer: Self-pay | Admitting: Nurse Practitioner

## 2020-01-18 ENCOUNTER — Ambulatory Visit: Payer: Self-pay | Attending: Nurse Practitioner | Admitting: Nurse Practitioner

## 2020-01-18 DIAGNOSIS — Z1321 Encounter for screening for nutritional disorder: Secondary | ICD-10-CM

## 2020-01-18 DIAGNOSIS — I480 Paroxysmal atrial fibrillation: Secondary | ICD-10-CM

## 2020-01-18 DIAGNOSIS — Z13220 Encounter for screening for lipoid disorders: Secondary | ICD-10-CM

## 2020-01-18 DIAGNOSIS — L243 Irritant contact dermatitis due to cosmetics: Secondary | ICD-10-CM

## 2020-01-18 DIAGNOSIS — Z130 Encounter for screening for diseases of the blood and blood-forming organs and certain disorders involving the immune mechanism: Secondary | ICD-10-CM

## 2020-01-18 DIAGNOSIS — Z13228 Encounter for screening for other metabolic disorders: Secondary | ICD-10-CM

## 2020-01-18 DIAGNOSIS — Z85828 Personal history of other malignant neoplasm of skin: Secondary | ICD-10-CM

## 2020-01-18 MED ORDER — TRIAMCINOLONE ACETONIDE 0.1 % EX CREA: 1.0000 "application " | TOPICAL_CREAM | Freq: Two times a day (BID) | CUTANEOUS | 1 refills | Status: DC

## 2020-01-18 MED ORDER — METOPROLOL SUCCINATE ER 25 MG PO TB24: 12.5000 mg | ORAL_TABLET | Freq: Every day | ORAL | 3 refills | Status: DC

## 2020-01-18 MED FILL — TRIAMCINOLONE ACETONIDE 0.1: 0.1 | 7 days supply | Qty: 60 | Fill #0
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
## 2020-01-21 ENCOUNTER — Other Ambulatory Visit: Payer: Self-pay

## 2020-01-21 ENCOUNTER — Ambulatory Visit: Payer: Self-pay | Attending: Nurse Practitioner

## 2020-01-21 DIAGNOSIS — Z1321 Encounter for screening for nutritional disorder: Secondary | ICD-10-CM

## 2020-01-21 DIAGNOSIS — Z13228 Encounter for screening for other metabolic disorders: Secondary | ICD-10-CM

## 2020-01-21 DIAGNOSIS — Z130 Encounter for screening for diseases of the blood and blood-forming organs and certain disorders involving the immune mechanism: Secondary | ICD-10-CM

## 2020-01-21 DIAGNOSIS — Z13220 Encounter for screening for lipoid disorders: Secondary | ICD-10-CM

## 2020-01-22 LAB — LIPID PANEL
Chol/HDL Ratio: 2.8 ratio (ref 0.0–4.4)
Cholesterol, Total: 203 mg/dL — ABNORMAL HIGH (ref 100–199)
HDL: 73 mg/dL (ref >39)
LDL Chol Calc (NIH): 117 mg/dL — ABNORMAL HIGH (ref 0–99)
Triglycerides: 70 mg/dL (ref 0–149)
VLDL Cholesterol Cal: 13 mg/dL (ref 5–40)

## 2020-01-22 LAB — CMP14+EGFR
ALT: 16 IU/L (ref 0–32)
AST: 22 IU/L (ref 0–40)
Albumin/Globulin Ratio: 1.4 (ref 1.2–2.2)
Albumin: 4.2 g/dL (ref 3.8–4.9)
Alkaline Phosphatase: 84 IU/L (ref 39–117)
BUN/Creatinine Ratio: 21 (ref 9–23)
BUN: 13 mg/dL (ref 6–24)
Bilirubin Total: 0.4 mg/dL (ref 0.0–1.2)
CO2: 26 mmol/L (ref 20–29)
Calcium: 9.5 mg/dL (ref 8.7–10.2)
Chloride: 102 mmol/L (ref 96–106)
Creatinine, Ser: 0.61 mg/dL (ref 0.57–1.00)
GFR calc Af Amer: 116 mL/min/{1.73_m2} (ref >59)
GFR calc non Af Amer: 101 mL/min/{1.73_m2} (ref >59)
Globulin, Total: 3 g/dL (ref 1.5–4.5)
Glucose: 86 mg/dL (ref 65–99)
Potassium: 4.6 mmol/L (ref 3.5–5.2)
Sodium: 140 mmol/L (ref 134–144)
Total Protein: 7.2 g/dL (ref 6.0–8.5)

## 2020-01-22 LAB — CBC
Hematocrit: 36.8 % (ref 34.0–46.6)
Hemoglobin: 12.4 g/dL (ref 11.1–15.9)
MCH: 30.4 pg (ref 26.6–33.0)
MCHC: 33.7 g/dL (ref 31.5–35.7)
MCV: 90 fL (ref 79–97)
Platelets: 305 10*3/uL (ref 150–450)
RBC: 4.08 x10E6/uL (ref 3.77–5.28)
RDW: 12.3 % (ref 11.7–15.4)
WBC: 5.7 10*3/uL (ref 3.4–10.8)

## 2020-01-22 LAB — VITAMIN D 25 HYDROXY (VIT D DEFICIENCY, FRACTURES): Vit D, 25-Hydroxy: 49 ng/mL (ref 30.0–100.0)

## 2020-01-29 MED FILL — METOPROLOL SUCCINATE ER 25: 25 | 90 days supply | Qty: 45 | Fill #0

## 2020-01-30 ENCOUNTER — Other Ambulatory Visit: Payer: Self-pay

## 2020-01-30 ENCOUNTER — Ambulatory Visit (HOSPITAL_COMMUNITY)
Admission: RE | Admit: 2020-01-30 | Discharge: 2020-01-30 | Disposition: A | Discharge status: Home / Self Care | Payer: Self-pay | Source: Ambulatory Visit | Attending: Gastroenterology | Admitting: Gastroenterology

## 2020-01-30 ENCOUNTER — Other Ambulatory Visit: Payer: Self-pay | Admitting: Gastroenterology

## 2020-01-30 DIAGNOSIS — K824 Cholesterolosis of gallbladder: Secondary | ICD-10-CM

## 2020-02-25 IMAGING — CR DG CERVICAL SPINE COMPLETE 4+V
6 series · 6 of 6 positions shown · non-contrast
Comparison: None.

CLINICAL DATA: Left neck and shoulder pain

EXAM:
CERVICAL SPINE - COMPLETE 4+ VIEW

[c-spine lat]
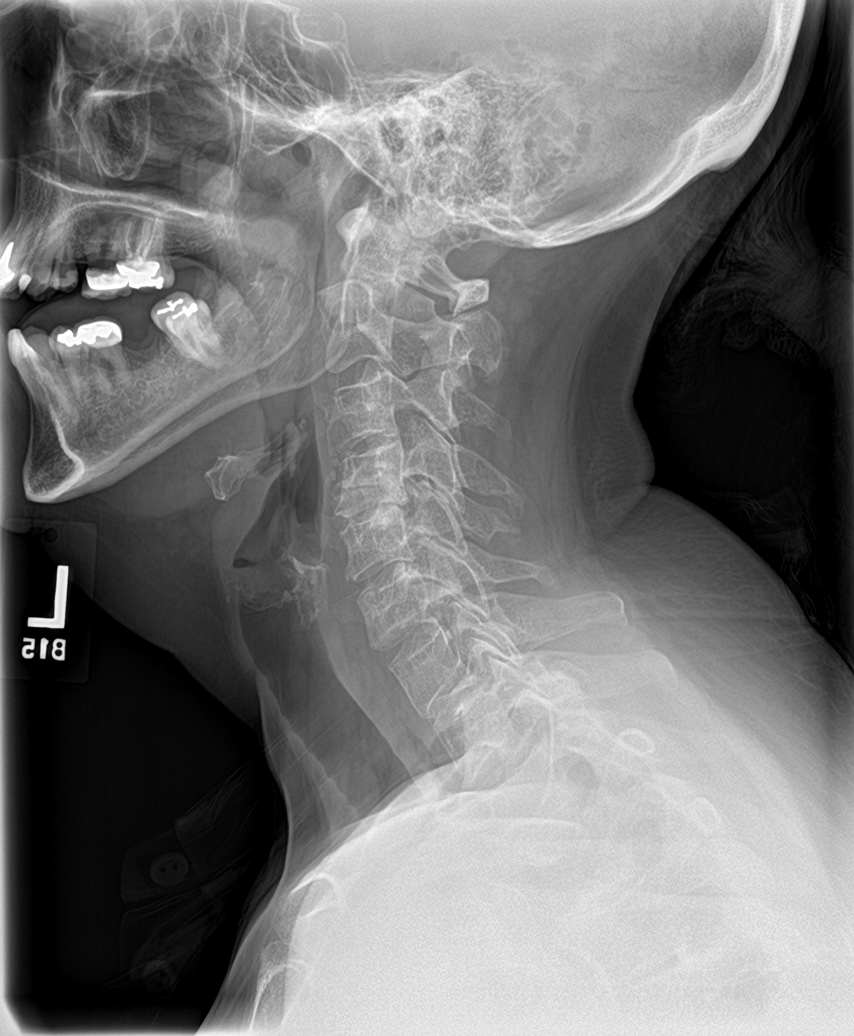

[c-spine obl (1 of 2)]
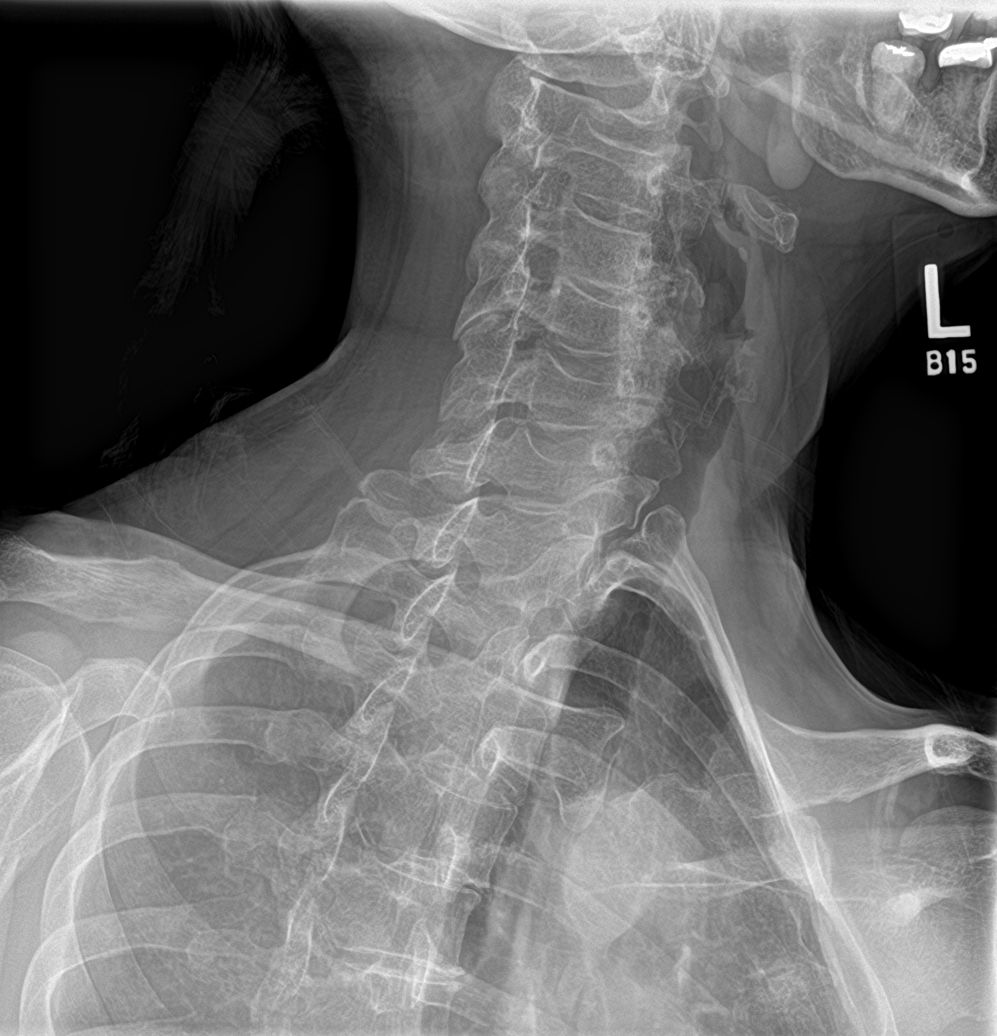

[c-spine ap]
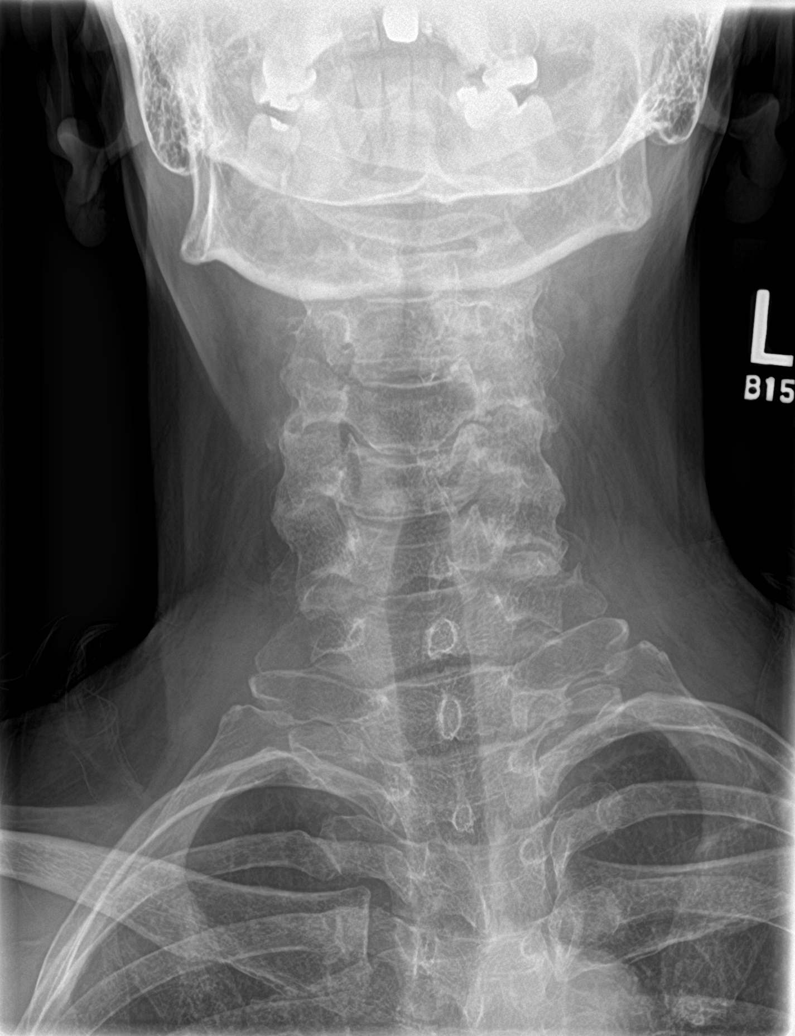

[c-spine open mouth]
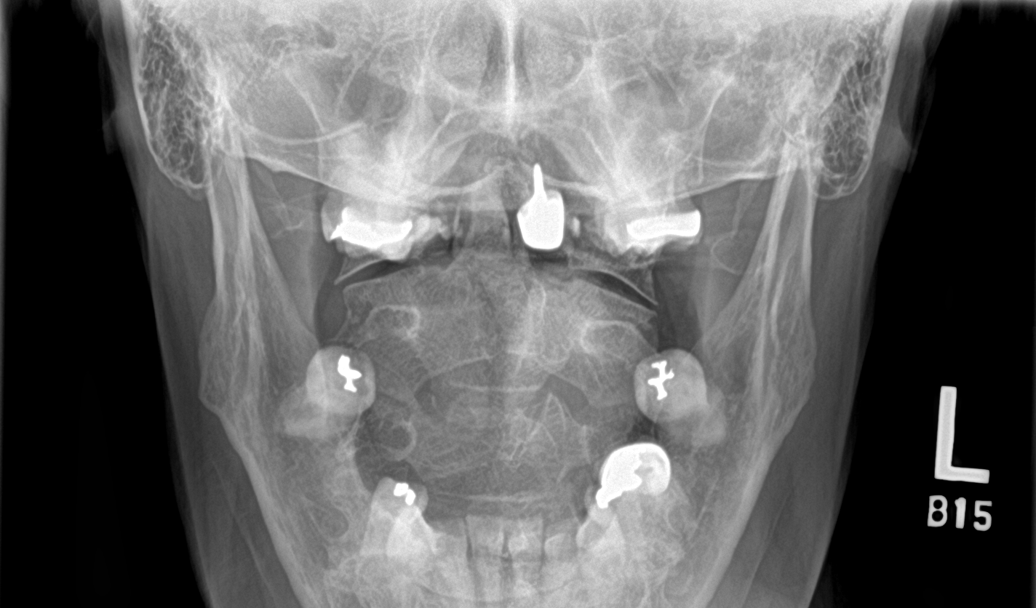

[[person_name]]
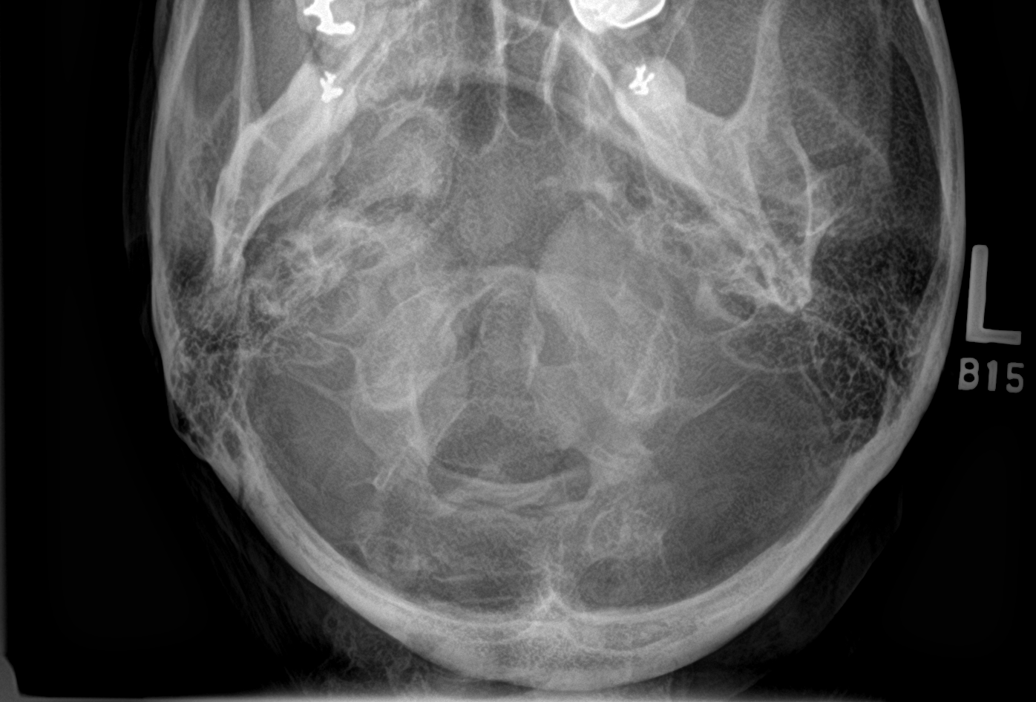

[c-spine obl (2 of 2)]
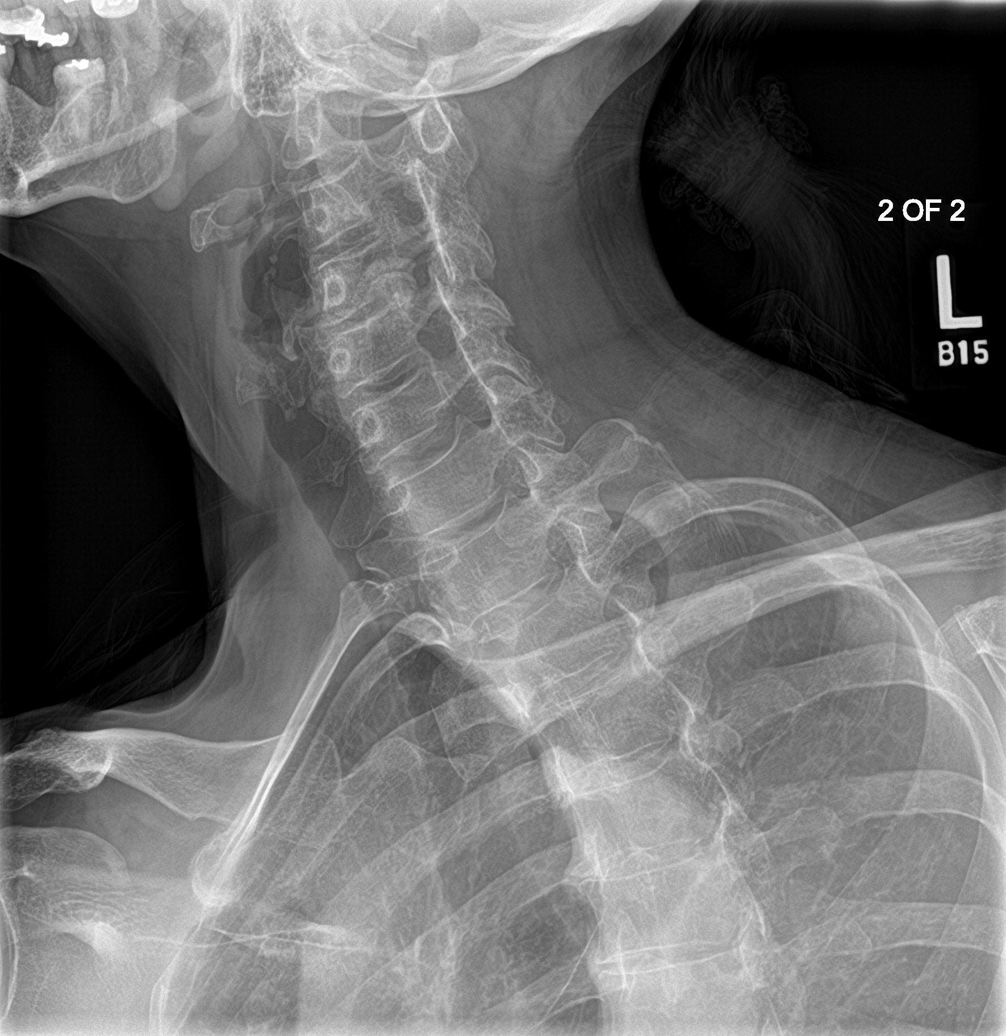

[6 of 6 positions shown; findings below may reference images not displayed]

FINDINGS: Trace retrolisthesis C4 on C5. Normal prevertebral soft tissue
thickness. Vertebral body heights are maintained. Moderate
degenerative changes C3-C4 and C4-C5. Dens and lateral masses are
within normal limits.
IMPRESSION: Moderate degenerative changes C3 through C5. No definite acute
osseous abnormality.

## 2020-02-25 IMAGING — CR DG SHOULDER 2+V*L*
3 series · 3 of 3 positions shown · non-contrast
Comparison: None.

CLINICAL DATA: Left shoulder pain

EXAM:
LEFT SHOULDER - 2+ VIEW

[shoulder grashey]
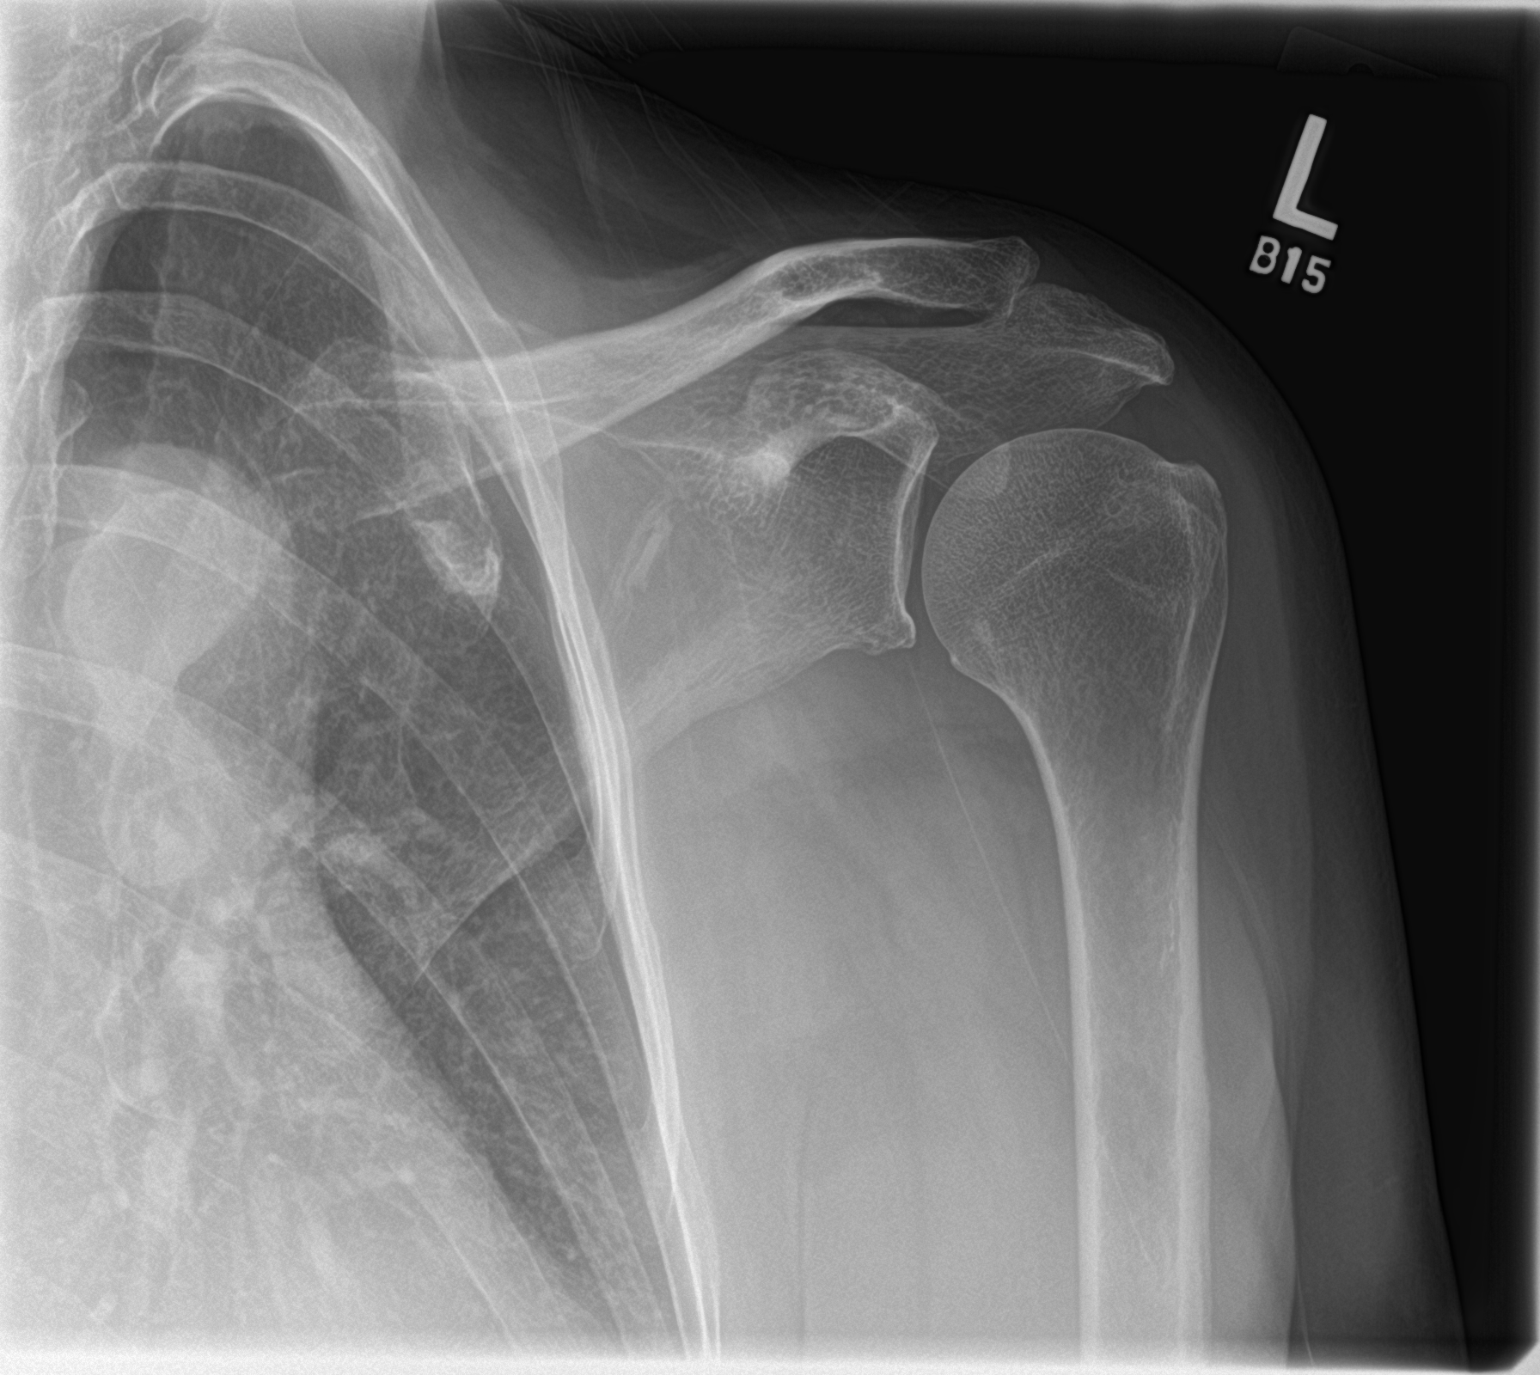

[shoulder y view]
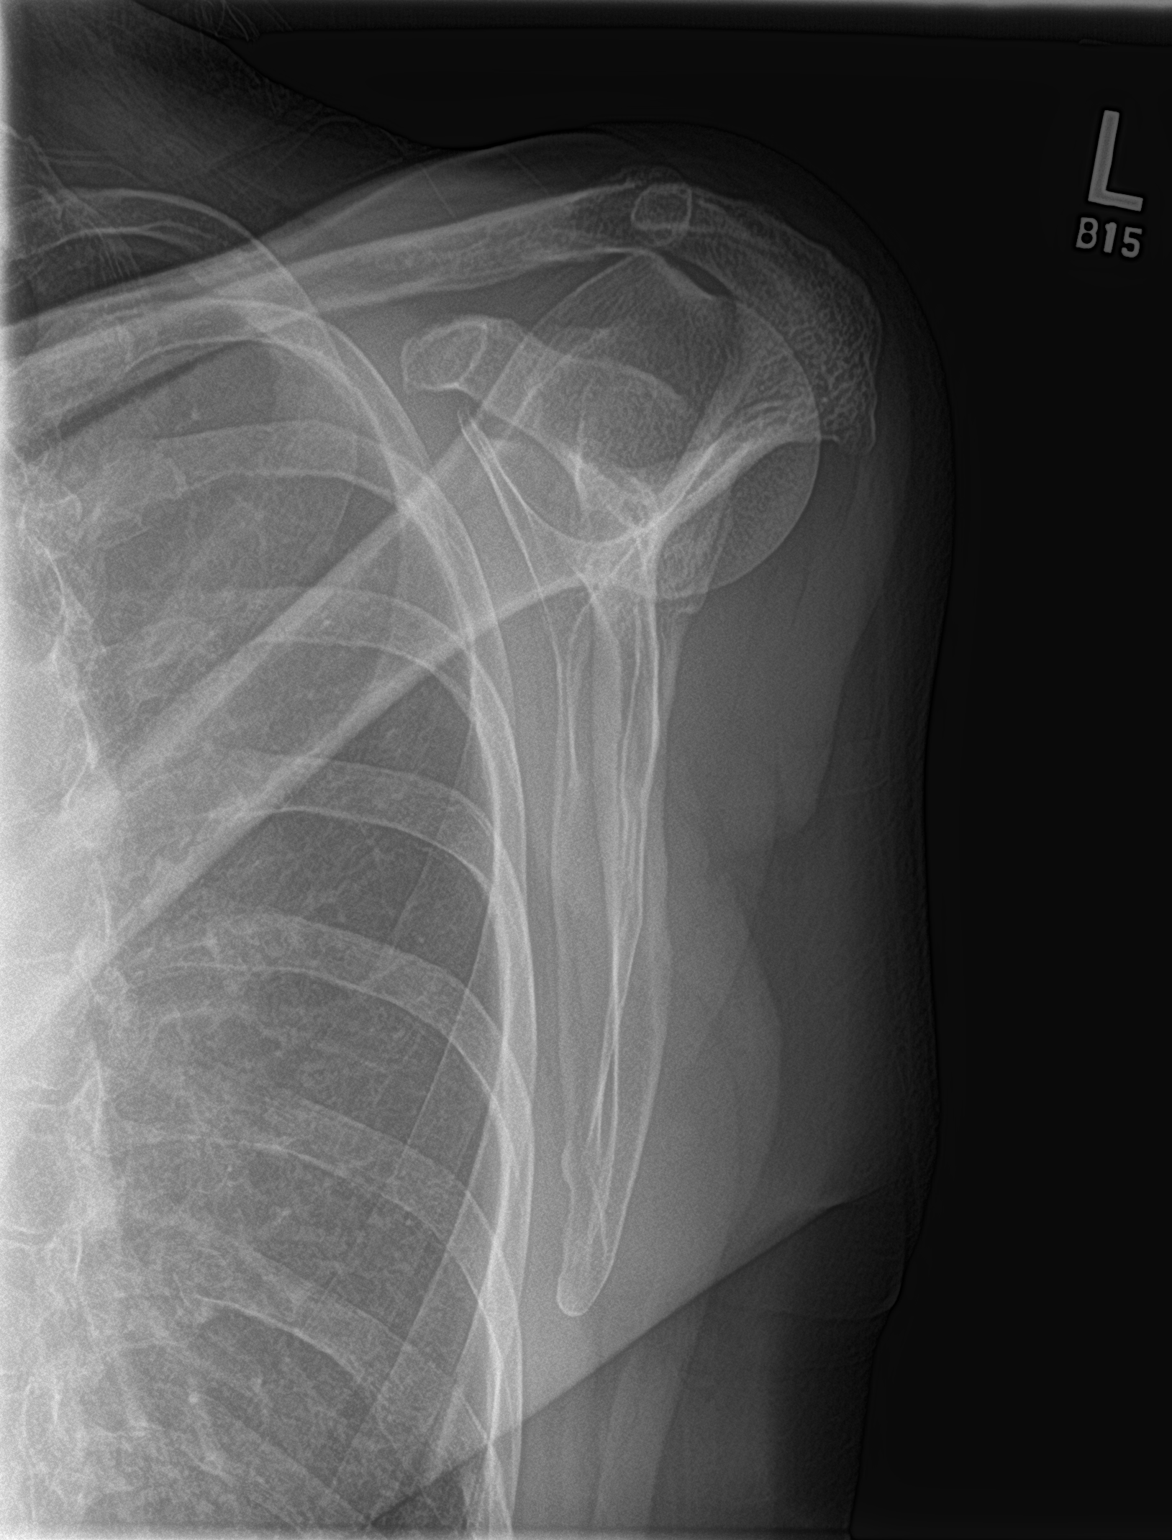

[shoulder axillary]
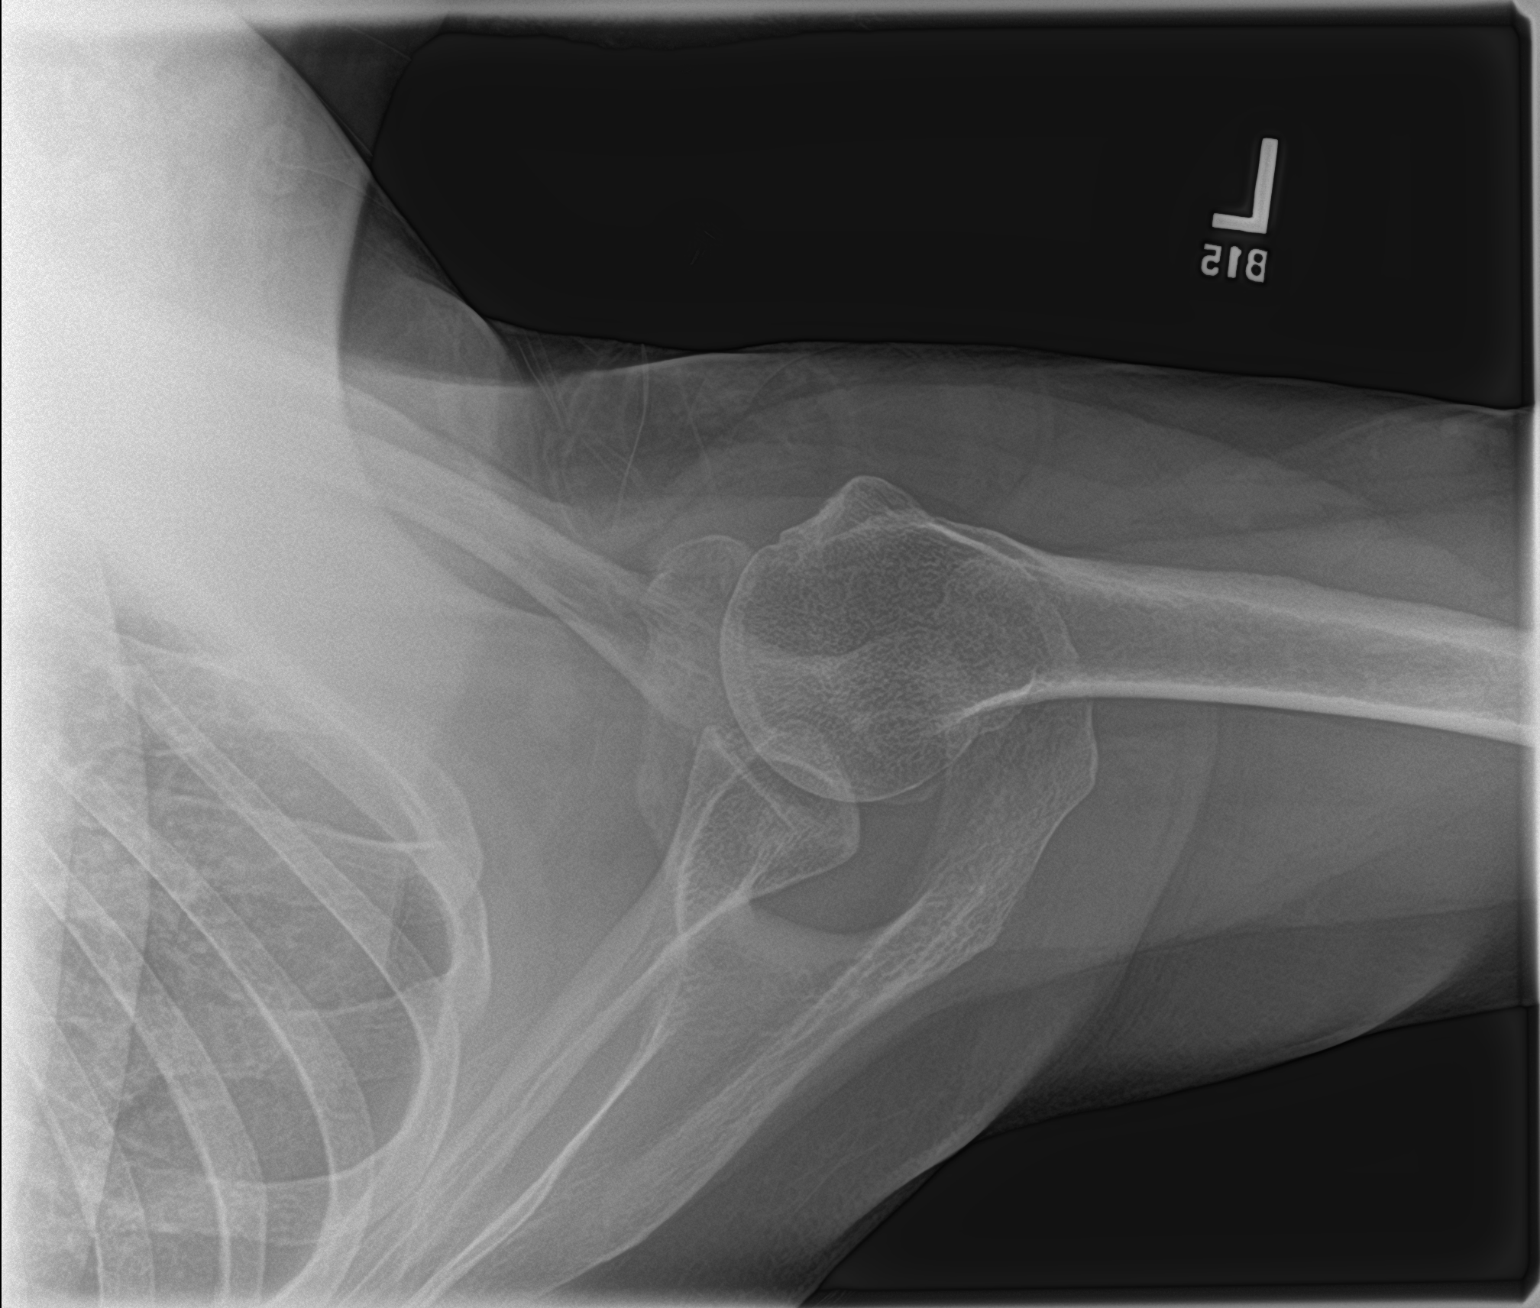

[3 of 3 positions shown; findings below may reference images not displayed]

FINDINGS: Mild AC joint degenerative change and glenohumeral degenerative
change. No fracture or malalignment.
IMPRESSION: No acute osseous abnormality.  Mild degenerative change.

## 2020-04-18 ENCOUNTER — Other Ambulatory Visit: Payer: Self-pay

## 2020-04-18 DIAGNOSIS — Z1231 Encounter for screening mammogram for malignant neoplasm of breast: Secondary | ICD-10-CM

## 2020-05-05 MED FILL — METOPROLOL SUCCINATE ER 25: 25 | 90 days supply | Qty: 45 | Fill #1

## 2020-05-13 ENCOUNTER — Other Ambulatory Visit: Payer: Self-pay

## 2020-05-13 ENCOUNTER — Ambulatory Visit
Admission: RE | Admit: 2020-05-13 | Discharge: 2020-05-13 | Disposition: A | Discharge status: Home / Self Care | Payer: No Typology Code available for payment source | Source: Ambulatory Visit | Attending: Nurse Practitioner | Admitting: Nurse Practitioner

## 2020-05-13 DIAGNOSIS — Z1231 Encounter for screening mammogram for malignant neoplasm of breast: Secondary | ICD-10-CM

## 2020-06-19 ENCOUNTER — Telehealth: Payer: Self-pay | Admitting: Nurse Practitioner

## 2020-06-19 NOTE — Telephone Encounter (Signed)
called back the Pt, LVM to call us back to schedule a financial appt after the program expire that is 07/19/20 she can not have an appt before that day

## 2020-06-19 NOTE — Telephone Encounter (Signed)
Please follow up Copied from [place] [id number]. Topic: General - Other >> Jun 18, 2020 10:02 AM Oneta Rack wrote: Reason for CRM: patient states her orange card expires 07/23/2020 unsure if appt is needed with carlos, please advise

## 2020-07-09 ENCOUNTER — Telehealth: Payer: Self-pay | Admitting: Specialist

## 2020-07-09 NOTE — Telephone Encounter (Signed)
Patient called.   She was told that she should not take ibuprofen but the tylenol is not helping. She wanted to know if would be okay to take it just for the time remaining until her upcoming appointment.   She also wanted to inquire about a referral to PT  Call back: [phone]

## 2020-07-10 ENCOUNTER — Other Ambulatory Visit: Payer: Self-pay | Admitting: Specialist

## 2020-07-10 DIAGNOSIS — M48062 Spinal stenosis, lumbar region with neurogenic claudication: Secondary | ICD-10-CM

## 2020-07-10 MED ORDER — MELOXICAM 15 MG PO TABS: 15.0000 mg | ORAL_TABLET | Freq: Every day | ORAL | 2 refills | Status: DC

## 2020-07-10 NOTE — Telephone Encounter (Signed)
Start mobic, sent to her pharmacy once per day.

## 2020-07-10 NOTE — Telephone Encounter (Signed)
She was told that she should not take ibuprofen but the tylenol is not helping. She wanted to know if would be okay to take it just for the time remaining until her upcoming appointment on 08/16/20.   She also wanted to inquire about a referral to PT  --Please advise

## 2020-07-11 NOTE — Telephone Encounter (Signed)
I called and advised patient last night

## 2020-07-15 ENCOUNTER — Ambulatory Visit: Payer: No Typology Code available for payment source

## 2020-07-21 ENCOUNTER — Ambulatory Visit: Payer: No Typology Code available for payment source

## 2020-07-22 ENCOUNTER — Ambulatory Visit: Payer: No Typology Code available for payment source

## 2020-07-28 MED FILL — METOPROLOL SUCCINATE ER 25: 25 | 90 days supply | Qty: 45 | Fill #2

## 2020-07-29 ENCOUNTER — Ambulatory Visit: Payer: Self-pay | Attending: Nurse Practitioner

## 2020-07-29 ENCOUNTER — Ambulatory Visit: Payer: Self-pay | Admitting: Physical Therapy

## 2020-07-29 ENCOUNTER — Other Ambulatory Visit: Payer: Self-pay

## 2020-08-12 ENCOUNTER — Ambulatory Visit: Payer: No Typology Code available for payment source | Admitting: Nurse Practitioner

## 2020-08-14 ENCOUNTER — Ambulatory Visit: Payer: Self-pay

## 2020-08-21 ENCOUNTER — Ambulatory Visit: Payer: No Typology Code available for payment source | Admitting: Specialist

## 2020-08-28 ENCOUNTER — Ambulatory Visit: Payer: No Typology Code available for payment source

## 2020-09-01 ENCOUNTER — Encounter (HOSPITAL_COMMUNITY): Payer: Self-pay | Admitting: Family Medicine

## 2020-09-01 ENCOUNTER — Ambulatory Visit (HOSPITAL_COMMUNITY)
Admission: EM | Admit: 2020-09-01 | Discharge: 2020-09-01 | Disposition: A | Discharge status: Home / Self Care | Payer: No Typology Code available for payment source | Attending: Family Medicine | Admitting: Family Medicine

## 2020-09-01 ENCOUNTER — Other Ambulatory Visit: Payer: Self-pay

## 2020-09-01 DIAGNOSIS — L03119 Cellulitis of unspecified part of limb: Secondary | ICD-10-CM

## 2020-09-01 DIAGNOSIS — R21 Rash and other nonspecific skin eruption: Secondary | ICD-10-CM

## 2020-09-01 MED ORDER — DOXYCYCLINE HYCLATE 100 MG PO CAPS: 100.0000 mg | ORAL_CAPSULE | Freq: Two times a day (BID) | ORAL | 0 refills | Status: AC

## 2020-09-01 NOTE — Discharge Instructions (Addendum)
Medication as prescribed Continue the triamcinolone cream and Eucerin.  Follow up with orthopedics for the ganglion cyst.

## 2020-09-01 NOTE — ED Triage Notes (Signed)
Pt reports having a insect bite to LT  Popliteal  Space which is now red and swollen . Pt now has redness to Rt Popliteal space . 

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
## 2020-09-22 MED FILL — TRIAMCINOLONE 0.1% CREAM: 0.1 | 7 days supply | Qty: 60 | Fill #1

## 2020-09-23 ENCOUNTER — Ambulatory Visit: Payer: No Typology Code available for payment source | Admitting: Nurse Practitioner

## 2020-09-30 ENCOUNTER — Encounter: Payer: No Typology Code available for payment source | Admitting: Nurse Practitioner

## 2020-10-02 ENCOUNTER — Ambulatory Visit (INDEPENDENT_AMBULATORY_CARE_PROVIDER_SITE_OTHER): Payer: No Typology Code available for payment source | Admitting: Specialist

## 2020-10-02 ENCOUNTER — Ambulatory Visit: Payer: Self-pay

## 2020-10-02 ENCOUNTER — Other Ambulatory Visit: Payer: Self-pay

## 2020-10-02 ENCOUNTER — Encounter: Payer: Self-pay | Admitting: Specialist

## 2020-10-02 DIAGNOSIS — M4316 Spondylolisthesis, lumbar region: Secondary | ICD-10-CM

## 2020-10-02 DIAGNOSIS — M47814 Spondylosis without myelopathy or radiculopathy, thoracic region: Secondary | ICD-10-CM

## 2020-10-02 DIAGNOSIS — M4814 Ankylosing hyperostosis [Forestier], thoracic region: Secondary | ICD-10-CM

## 2020-10-02 DIAGNOSIS — M25531 Pain in right wrist: Secondary | ICD-10-CM

## 2020-10-02 DIAGNOSIS — M6740 Ganglion, unspecified site: Secondary | ICD-10-CM

## 2020-10-02 DIAGNOSIS — M25561 Pain in right knee: Secondary | ICD-10-CM

## 2020-10-02 MED ORDER — METHYLPREDNISOLONE ACETATE 40 MG/ML IJ SUSP
13.3300 mg | INTRAMUSCULAR | Status: AC | PRN
  Administered 2020-10-02: 13.33 mg

## 2020-10-02 MED ORDER — BUPIVACAINE HCL 0.5 % IJ SOLN
1.0000 mL | INTRAMUSCULAR | Status: AC | PRN
  Administered 2020-10-02: 1 mL

## 2020-10-02 NOTE — Patient Instructions (Addendum)
Plan: ice locally 30 min on and 30 min off for 2-3 hoours decreases swelling and pain.  Motrin or tylenol for pain. Use a splint right wrist for 1 week then discontinue. Risk of recurrence is 50%.Hemp CBD capsules, amazon.com 5,000-7,000 mg per bottle, 60 capsules per bottle, take one capsule twice a day. Best exercises for thoracic spine are extension exercise. You may find some relief of pain with Salon pas or with a longline brassiere or a champion work belt as can be obtained at [employer] or [employer] for support intermittantly of the upper back do not use the brace continuously, 1/2 days at the most or muscle weakness and increase stress on the spine structure results.  Follow-Up Instructions: No follow-ups on file.
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
## 2020-10-07 ENCOUNTER — Other Ambulatory Visit: Payer: Self-pay

## 2020-10-07 ENCOUNTER — Telehealth: Payer: Self-pay | Admitting: Specialist

## 2020-10-07 ENCOUNTER — Ambulatory Visit: Payer: No Typology Code available for payment source | Attending: Nurse Practitioner | Admitting: Nurse Practitioner

## 2020-10-07 ENCOUNTER — Telehealth: Payer: Self-pay | Admitting: Internal Medicine

## 2020-10-07 ENCOUNTER — Other Ambulatory Visit: Payer: Self-pay | Admitting: Nurse Practitioner

## 2020-10-07 ENCOUNTER — Encounter: Payer: Self-pay | Admitting: Nurse Practitioner

## 2020-10-07 VITALS — BP 119/80 | HR 65 | Temp 98.3°F | Ht 68.0 in | Wt 181.0 lb

## 2020-10-07 DIAGNOSIS — R0981 Nasal congestion: Secondary | ICD-10-CM

## 2020-10-07 DIAGNOSIS — Z1231 Encounter for screening mammogram for malignant neoplasm of breast: Secondary | ICD-10-CM

## 2020-10-07 DIAGNOSIS — Z124 Encounter for screening for malignant neoplasm of cervix: Secondary | ICD-10-CM

## 2020-10-07 DIAGNOSIS — I2510 Atherosclerotic heart disease of native coronary artery without angina pectoris: Secondary | ICD-10-CM

## 2020-10-07 DIAGNOSIS — Z0000 Encounter for general adult medical examination without abnormal findings: Secondary | ICD-10-CM

## 2020-10-07 MED ORDER — AZELASTINE HCL 0.1 % NA SOLN: 1.0000 | Freq: Two times a day (BID) | NASAL | 3 refills | Status: DC

## 2020-10-07 NOTE — Telephone Encounter (Signed)
Spoke to patient-she reports she had her yearly physical with her PCP today and on the AVS she noticed the dx of arteriosclerotic cardiovascular disease and she was not aware she had this.   She states from her understanding of past test, she did not have plaque in her carotids or any coronary disease that she was aware of.  Advised per chart review-I see no evidence of this, calcium score 0, carotid normal, no mention of dx in previous note other than family hx.  She will discuss with PCP further   Patient also states she had a ganglion cyst on her wrist last week and had to have it drained and a steroid was injected into her wrist.  She states she had allergic reaction to this medication which caused her BP to elevate.   She states she took an extra metoprolol and BP/Hr returned to normal after a few hours.  No issues since.   She states pcp thought the elevation in BP was due to the reaction.  Advised if has continued to be okay to continue to monitor and notify with any significant changes.     She has visit with Dr. [name] in January, will call sooner if needed.

## 2020-10-07 NOTE — Telephone Encounter (Signed)
New message:     Patient calling to ask some questions. Patient also having some medications reactions. Please call patient back.

## 2020-10-07 NOTE — Telephone Encounter (Signed)
Patient called advised the AVS shows she was treated for her right knee. Patient said she was treated for her wrist. Patient asked if this can be corrected. The number to contact patient is [phone]
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
## 2020-10-08 ENCOUNTER — Encounter: Payer: Self-pay | Admitting: Nurse Practitioner

## 2020-10-08 LAB — CMP14+EGFR
ALT: 17 IU/L (ref 0–32)
AST: 18 IU/L (ref 0–40)
Albumin/Globulin Ratio: 1.3 (ref 1.2–2.2)
Albumin: 4.3 g/dL (ref 3.8–4.9)
Alkaline Phosphatase: 89 IU/L (ref 44–121)
BUN/Creatinine Ratio: 19 (ref 9–23)
BUN: 14 mg/dL (ref 6–24)
Bilirubin Total: 0.4 mg/dL (ref 0.0–1.2)
CO2: 26 mmol/L (ref 20–29)
Calcium: 9.4 mg/dL (ref 8.7–10.2)
Chloride: 98 mmol/L (ref 96–106)
Creatinine, Ser: 0.72 mg/dL (ref 0.57–1.00)
GFR calc Af Amer: 107 mL/min/{1.73_m2} (ref >59)
GFR calc non Af Amer: 93 mL/min/{1.73_m2} (ref >59)
Globulin, Total: 3.2 g/dL (ref 1.5–4.5)
Glucose: 76 mg/dL (ref 65–99)
Potassium: 4.4 mmol/L (ref 3.5–5.2)
Sodium: 138 mmol/L (ref 134–144)
Total Protein: 7.5 g/dL (ref 6.0–8.5)

## 2020-10-08 LAB — CERVICOVAGINAL ANCILLARY ONLY
Bacterial Vaginitis (gardnerella): NEGATIVE
Candida Glabrata: NEGATIVE
Candida Vaginitis: NEGATIVE
Chlamydia: NEGATIVE
Comment: NEGATIVE
Comment: NEGATIVE
Comment: NEGATIVE
Comment: NEGATIVE
Comment: NEGATIVE
Comment: NORMAL
Neisseria Gonorrhea: NEGATIVE
Trichomonas: NEGATIVE

## 2020-10-08 LAB — LIPID PANEL
Chol/HDL Ratio: 2.6 ratio (ref 0.0–4.4)
Cholesterol, Total: 196 mg/dL (ref 100–199)
HDL: 76 mg/dL (ref >39)
LDL Chol Calc (NIH): 107 mg/dL — ABNORMAL HIGH (ref 0–99)
Triglycerides: 74 mg/dL (ref 0–149)
VLDL Cholesterol Cal: 13 mg/dL (ref 5–40)

## 2020-10-08 NOTE — Telephone Encounter (Signed)
Corrected and new AVS printed and mailed to patient

## 2020-10-10 LAB — CYTOLOGY - PAP
Comment: NEGATIVE
Diagnosis: NEGATIVE
High risk HPV: NEGATIVE

## 2020-10-19 ENCOUNTER — Encounter: Payer: Self-pay | Admitting: Nurse Practitioner

## 2020-10-22 MED FILL — METOPROLOL SUCCINATE ER 25: 25 | 90 days supply | Qty: 45 | Fill #3

## 2020-11-06 ENCOUNTER — Telehealth (INDEPENDENT_AMBULATORY_CARE_PROVIDER_SITE_OTHER): Payer: Self-pay | Admitting: Internal Medicine

## 2020-11-06 ENCOUNTER — Encounter: Payer: Self-pay | Admitting: Internal Medicine

## 2020-11-06 VITALS — BP 108/60 | HR 76 | Wt 177.0 lb

## 2020-11-06 DIAGNOSIS — I480 Paroxysmal atrial fibrillation: Secondary | ICD-10-CM

## 2020-11-06 DIAGNOSIS — I77810 Thoracic aortic ectasia: Secondary | ICD-10-CM
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
## 2020-11-06 NOTE — Patient Instructions (Signed)
Medication Instructions:  Your physician recommends that you continue on your current medications as directed. Please refer to the Current Medication list given to you today.  *If you need a refill on your cardiac medications before your next appointment, please call your pharmacy*   Lab Work: NONE If you have labs (blood work) drawn today and your tests are completely normal, you will receive your results only by: Marland Kitchen MyChart Message (if you have MyChart) OR . A paper copy in the mail If you have any lab test that is abnormal or we need to change your treatment, we will call you to review the results.   Testing/Procedures: Your physician has requested that you have an echocardiogram. Echocardiography is a painless test that uses sound waves to create images of your heart. It provides your doctor with information about the size and shape of your heart and how well your heart's chambers and valves are working. This procedure takes approximately one hour. There are no restrictions for this procedure. -- [address] - [address] Floor   Follow-Up: At [facility], you and your health needs are our priority.  As part of our continuing mission to provide you with exceptional heart care, we have created designated Provider Care Teams.  These Care Teams include your primary Cardiologist (physician) and Advanced Practice Providers (APPs -  Physician Assistants and Nurse Practitioners) who all work together to provide you with the care you need, when you need it.  We recommend signing up for the patient portal called "MyChart".  Sign up information is provided on this After Visit Summary.  MyChart is used to connect with patients for Virtual Visits (Telemedicine).  Patients are able to view lab/test results, encounter notes, upcoming appointments, etc.  Non-urgent messages can be sent to your provider as well.   To learn more about what you can do with MyChart, go to [email or web address].     Your next appointment:   12 month(s)  The format for your next appointment:   In Person  Provider:   You may see Pixie Casino, MD or one of the following Advanced Practice Providers on your designated Care Team:    Almyra Deforest, PA-C  Fabian Sharp, PA-C or   Roby Lofts, [place]    Other Instructions

## 2020-11-13 ENCOUNTER — Ambulatory Visit (INDEPENDENT_AMBULATORY_CARE_PROVIDER_SITE_OTHER): Payer: No Typology Code available for payment source | Admitting: Specialist

## 2020-11-13 ENCOUNTER — Other Ambulatory Visit: Payer: Self-pay

## 2020-11-13 ENCOUNTER — Encounter: Payer: Self-pay | Admitting: Specialist

## 2020-11-13 VITALS — BP 113/75 | HR 65 | Ht 68.0 in | Wt 181.0 lb

## 2020-11-13 DIAGNOSIS — R519 Headache, unspecified: Secondary | ICD-10-CM

## 2020-11-13 DIAGNOSIS — M47814 Spondylosis without myelopathy or radiculopathy, thoracic region: Secondary | ICD-10-CM

## 2020-11-13 DIAGNOSIS — M4814 Ankylosing hyperostosis [Forestier], thoracic region: Secondary | ICD-10-CM

## 2020-11-13 DIAGNOSIS — M6740 Ganglion, unspecified site: Secondary | ICD-10-CM

## 2020-11-13 NOTE — Patient Instructions (Signed)
Plan: ice locally 30 min on and 30 min off for 2-3 hoours decreases swelling and pain.  Motrin or tylenol for pain. Use a splint right wrist for 1 week then discontinue. Risk of recurrence is 50%.Hemp CBD capsules, amazon.com 5,000-7,000 mg per bottle, 60 capsules per bottle, take one capsule twice a day. Best exercises for thoracic spine are extension exercise. You may find some relief of pain with Salon pas or with a longline brassiere or a champion work belt as can be obtained at [employer] or Corinna Capra' for support intermittantly of the upper back do not use the brace continuously, 1/2 days at the most or muscle weakness and increase stress on the spine structure results.  Follow-Up Instructions: No follow-ups on file.    Follow-Up Instructions: No follow-ups on file.
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
## 2020-11-25 ENCOUNTER — Encounter: Payer: Self-pay | Admitting: Orthopaedic Surgery

## 2020-11-25 ENCOUNTER — Other Ambulatory Visit: Payer: Self-pay

## 2020-11-25 ENCOUNTER — Ambulatory Visit (INDEPENDENT_AMBULATORY_CARE_PROVIDER_SITE_OTHER): Payer: Self-pay | Admitting: Orthopaedic Surgery

## 2020-11-25 DIAGNOSIS — M6740 Ganglion, unspecified site: Secondary | ICD-10-CM
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
## 2020-11-26 ENCOUNTER — Telehealth: Payer: Self-pay | Admitting: Specialist

## 2020-11-26 NOTE — Telephone Encounter (Signed)
We referred her to Dr. Radene Journey, but his office doesn't take the patients insurance.  Please advise. She has the [facility] discount.

## 2020-11-26 NOTE — Telephone Encounter (Signed)
I called and spoke with patient and advised her of Dr. Otho Ket message below, she states that she would rather see ENT for for her ear than Neurology. She states that she will see her PCP and see if they can facilitate her to get somewhere, and if not she will call back to get [place] to do a referral to Neurology.

## 2020-11-26 NOTE — Telephone Encounter (Signed)
I can make Dr.Newman's office see her. I think she will need to see her primary care MD for her ear ache and they may be able to facilitate Having an ENT see her. 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

## 2020-11-26 NOTE — Telephone Encounter (Signed)
Pt called stating she was supposed to have a referral for a ear noe and throat Dr. But she hasn't heard anything and she currently has an ear ache; pt would like to have this checked on and the be CB to update her on the status.   [phone]

## 2020-11-27 ENCOUNTER — Ambulatory Visit (HOSPITAL_COMMUNITY): Payer: No Typology Code available for payment source | Attending: Cardiovascular Disease

## 2020-11-27 ENCOUNTER — Other Ambulatory Visit: Payer: Self-pay

## 2020-11-27 DIAGNOSIS — I77810 Thoracic aortic ectasia: Secondary | ICD-10-CM

## 2020-11-27 LAB — ECHOCARDIOGRAM COMPLETE
Area-P 1/2: 2.26 cm2
MV M vel: 5.16 m/s
MV Peak grad: 106.5 mmHg
P 1/2 time: 536 msec
Radius: 0.45 cm
S' Lateral: 2.8 cm

## 2021-01-06 ENCOUNTER — Other Ambulatory Visit: Payer: Self-pay

## 2021-01-06 ENCOUNTER — Encounter: Payer: Self-pay | Admitting: Nurse Practitioner

## 2021-01-06 ENCOUNTER — Ambulatory Visit: Payer: Self-pay | Attending: Nurse Practitioner | Admitting: Nurse Practitioner

## 2021-01-06 ENCOUNTER — Other Ambulatory Visit: Payer: Self-pay | Admitting: Nurse Practitioner

## 2021-01-06 DIAGNOSIS — M79645 Pain in left finger(s): Secondary | ICD-10-CM

## 2021-01-06 DIAGNOSIS — Z8679 Personal history of other diseases of the circulatory system: Secondary | ICD-10-CM

## 2021-01-06 MED ORDER — DICLOFENAC SODIUM 1 % EX GEL: 2.0000 g | Freq: Four times a day (QID) | CUTANEOUS | 1 refills | Status: DC

## 2021-01-06 MED ORDER — METOPROLOL SUCCINATE ER 25 MG PO TB24: 12.5000 mg | ORAL_TABLET | Freq: Every day | ORAL | 3 refills | Status: DC
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
## 2021-01-07 ENCOUNTER — Ambulatory Visit: Payer: Self-pay | Attending: Nurse Practitioner

## 2021-01-07 ENCOUNTER — Other Ambulatory Visit: Payer: Self-pay

## 2021-01-07 DIAGNOSIS — Z8679 Personal history of other diseases of the circulatory system: Secondary | ICD-10-CM

## 2021-01-08 LAB — TSH: TSH: 1.67 u[IU]/mL (ref 0.450–4.500)

## 2021-01-20 ENCOUNTER — Telehealth: Payer: Self-pay | Admitting: Nurse Practitioner

## 2021-01-20 NOTE — Telephone Encounter (Signed)
Will forward to provider  

## 2021-01-20 NOTE — Telephone Encounter (Signed)
Copied from [place] [id number]. Topic: Referral - Request for Referral >> Jan 19, 2021  3:08 PM Greggory Keen D wrote: Has patient seen PCP for this complaint? Yes.    Referral for which specialty: orthopedic  [facility]  ([facility])  Preferred provider/office: Dr Erlinda Hong  Reason for referral: Finger finger

## 2021-01-21 NOTE — Telephone Encounter (Signed)
She is already seeing him. She should not need another referral

## 2021-01-21 NOTE — Telephone Encounter (Signed)
Returned pt call and made aware of provider response

## 2021-01-23 MED FILL — METOPROLOL SUCCINATE ER 25: 25 | 90 days supply | Qty: 45 | Fill #0

## 2021-01-29 ENCOUNTER — Ambulatory Visit: Payer: No Typology Code available for payment source

## 2021-02-17 ENCOUNTER — Other Ambulatory Visit: Payer: Self-pay

## 2021-02-17 ENCOUNTER — Ambulatory Visit (INDEPENDENT_AMBULATORY_CARE_PROVIDER_SITE_OTHER): Payer: Self-pay

## 2021-02-17 ENCOUNTER — Ambulatory Visit (INDEPENDENT_AMBULATORY_CARE_PROVIDER_SITE_OTHER): Payer: Self-pay | Admitting: Orthopaedic Surgery

## 2021-02-17 ENCOUNTER — Encounter: Payer: Self-pay | Admitting: Orthopaedic Surgery

## 2021-02-17 DIAGNOSIS — M79645 Pain in left finger(s): Secondary | ICD-10-CM

## 2021-02-17 MED ORDER — PREDNISONE 5 MG PO TABS
ORAL_TABLET | ORAL | 0 refills | Status: DC
  Filled 2021-02-17: qty 21, 6d supply, fill #0
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
## 2021-02-19 ENCOUNTER — Other Ambulatory Visit: Payer: Self-pay

## 2021-03-24 ENCOUNTER — Ambulatory Visit: Payer: No Typology Code available for payment source

## 2021-03-26 ENCOUNTER — Ambulatory Visit: Payer: Self-pay | Attending: Nurse Practitioner

## 2021-03-26 ENCOUNTER — Other Ambulatory Visit: Payer: Self-pay

## 2021-04-14 ENCOUNTER — Ambulatory Visit: Payer: No Typology Code available for payment source | Admitting: Nurse Practitioner

## 2021-04-20 ENCOUNTER — Other Ambulatory Visit: Payer: Self-pay

## 2021-04-20 MED FILL — Metoprolol Succinate Tab ER 24HR 25 MG (Tartrate Equiv): ORAL | 30 days supply | Qty: 15 | Fill #0 | Status: CN

## 2021-04-22 ENCOUNTER — Other Ambulatory Visit: Payer: Self-pay

## 2021-04-22 MED FILL — Metoprolol Succinate Tab ER 24HR 25 MG (Tartrate Equiv): ORAL | 90 days supply | Qty: 45 | Fill #0 | Status: AC

## 2021-06-03 ENCOUNTER — Ambulatory Visit: Payer: Self-pay | Admitting: Nurse Practitioner

## 2021-07-02 ENCOUNTER — Other Ambulatory Visit: Payer: Self-pay

## 2021-07-02 ENCOUNTER — Encounter (HOSPITAL_COMMUNITY): Payer: Self-pay | Admitting: Emergency Medicine

## 2021-07-02 ENCOUNTER — Ambulatory Visit (HOSPITAL_COMMUNITY)
Admission: EM | Admit: 2021-07-02 | Discharge: 2021-07-02 | Disposition: A | Discharge status: Home / Self Care | Payer: Self-pay | Attending: Internal Medicine | Admitting: Internal Medicine

## 2021-07-02 DIAGNOSIS — F439 Reaction to severe stress, unspecified: Secondary | ICD-10-CM

## 2021-07-02 DIAGNOSIS — R300 Dysuria: Secondary | ICD-10-CM

## 2021-07-02 DIAGNOSIS — F349 Persistent mood [affective] disorder, unspecified: Secondary | ICD-10-CM

## 2021-07-02 LAB — POCT URINALYSIS DIPSTICK, ED / UC
Bilirubin Urine: NEGATIVE
Glucose, UA: NEGATIVE mg/dL
Hgb urine dipstick: NEGATIVE
Ketones, ur: NEGATIVE mg/dL
Leukocytes,Ua: NEGATIVE
Nitrite: NEGATIVE
Protein, ur: NEGATIVE mg/dL
Specific Gravity, Urine: 1.015 (ref 1.005–1.030)
Urobilinogen, UA: 0.2 mg/dL (ref 0.0–1.0)
pH: 5.5 (ref 5.0–8.0)

## 2021-07-02 NOTE — ED Triage Notes (Signed)
Pt reports urinary urgency and frequency for 3 days
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
## 2021-07-02 NOTE — Discharge Instructions (Signed)
You can take Tylenol and/or Ibuprofen as needed for pain relief and fever reduction.   Make sure you are drinking plenty of fluids, especially water.  You can drink cranberry juice to help with symptom relief, but make sure it is cranberry juice and not cranberry cocktail.  You can also try AZO, cranberry pills, or pyridium as needed.    You can try meditation and relaxation exercises to help with stress.  Return or go to the Emergency Department if symptoms worsen or do not improve in the next few days.

## 2021-07-06 ENCOUNTER — Other Ambulatory Visit: Payer: Self-pay

## 2021-07-06 ENCOUNTER — Other Ambulatory Visit: Payer: Self-pay | Admitting: Nurse Practitioner

## 2021-07-06 ENCOUNTER — Ambulatory Visit: Payer: Self-pay | Attending: Nurse Practitioner | Admitting: Nurse Practitioner

## 2021-07-06 ENCOUNTER — Encounter: Payer: Self-pay | Admitting: Nurse Practitioner

## 2021-07-06 VITALS — BP 124/81 | HR 68 | Ht 68.0 in | Wt 179.0 lb

## 2021-07-06 DIAGNOSIS — R1084 Generalized abdominal pain: Secondary | ICD-10-CM

## 2021-07-06 DIAGNOSIS — R109 Unspecified abdominal pain: Secondary | ICD-10-CM

## 2021-07-06 DIAGNOSIS — R3129 Other microscopic hematuria: Secondary | ICD-10-CM

## 2021-07-06 LAB — POCT URINALYSIS DIP (CLINITEK)
Bilirubin, UA: NEGATIVE
Glucose, UA: NEGATIVE mg/dL
Ketones, POC UA: NEGATIVE mg/dL
Leukocytes, UA: NEGATIVE
Nitrite, UA: NEGATIVE
POC PROTEIN,UA: NEGATIVE
Spec Grav, UA: 1.01 (ref 1.010–1.025)
Urobilinogen, UA: 0.2 E.U./dL
pH, UA: 5.5 (ref 5.0–8.0)

## 2021-07-06 MED ORDER — OMEPRAZOLE 40 MG PO CPDR
40.0000 mg | DELAYED_RELEASE_CAPSULE | Freq: Every day | ORAL | 0 refills | Status: DC
  Filled 2021-07-06: qty 30, 30d supply, fill #0
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
## 2021-07-07 LAB — CMP14+EGFR
ALT: 16 IU/L (ref 0–32)
AST: 23 IU/L (ref 0–40)
Albumin/Globulin Ratio: 1.6 (ref 1.2–2.2)
Albumin: 4.9 g/dL (ref 3.8–4.9)
Alkaline Phosphatase: 88 IU/L (ref 44–121)
BUN/Creatinine Ratio: 13 (ref 9–23)
BUN: 9 mg/dL (ref 6–24)
Bilirubin Total: 0.4 mg/dL (ref 0.0–1.2)
CO2: 26 mmol/L (ref 20–29)
Calcium: 9.7 mg/dL (ref 8.7–10.2)
Chloride: 99 mmol/L (ref 96–106)
Creatinine, Ser: 0.7 mg/dL (ref 0.57–1.00)
Globulin, Total: 3 g/dL (ref 1.5–4.5)
Glucose: 81 mg/dL (ref 65–99)
Potassium: 4.3 mmol/L (ref 3.5–5.2)
Sodium: 139 mmol/L (ref 134–144)
Total Protein: 7.9 g/dL (ref 6.0–8.5)
eGFR: 100 mL/min/{1.73_m2} (ref >59)

## 2021-07-07 LAB — URINALYSIS, COMPLETE
Bilirubin, UA: NEGATIVE
Glucose, UA: NEGATIVE
Ketones, UA: NEGATIVE
Leukocytes,UA: NEGATIVE
Nitrite, UA: NEGATIVE
Protein,UA: NEGATIVE
RBC, UA: NEGATIVE
Specific Gravity, UA: 1.007 (ref 1.005–1.030)
Urobilinogen, Ur: 0.2 mg/dL (ref 0.2–1.0)
pH, UA: 5.5 (ref 5.0–7.5)

## 2021-07-07 LAB — CBC WITH DIFFERENTIAL/PLATELET
Basophils Absolute: 0.1 10*3/uL (ref 0.0–0.2)
Basos: 1 %
EOS (ABSOLUTE): 0.3 10*3/uL (ref 0.0–0.4)
Eos: 4 %
Hematocrit: 38.2 % (ref 34.0–46.6)
Hemoglobin: 12.3 g/dL (ref 11.1–15.9)
Immature Grans (Abs): 0 10*3/uL (ref 0.0–0.1)
Immature Granulocytes: 0 %
Lymphocytes Absolute: 2.1 10*3/uL (ref 0.7–3.1)
Lymphs: 28 %
MCH: 29.2 pg (ref 26.6–33.0)
MCHC: 32.2 g/dL (ref 31.5–35.7)
MCV: 91 fL (ref 79–97)
Monocytes Absolute: 0.7 10*3/uL (ref 0.1–0.9)
Monocytes: 10 %
Neutrophils Absolute: 4.3 10*3/uL (ref 1.4–7.0)
Neutrophils: 57 %
Platelets: 323 10*3/uL (ref 150–450)
RBC: 4.21 x10E6/uL (ref 3.77–5.28)
RDW: 12.5 % (ref 11.7–15.4)
WBC: 7.5 10*3/uL (ref 3.4–10.8)

## 2021-07-07 LAB — MICROSCOPIC EXAMINATION
Bacteria, UA: NONE SEEN
Casts: NONE SEEN /lpf
Epithelial Cells (non renal): NONE SEEN /hpf (ref 0–10)
RBC, Urine: NONE SEEN /hpf (ref 0–2)
WBC, UA: NONE SEEN /hpf (ref 0–5)

## 2021-07-08 ENCOUNTER — Encounter: Payer: Self-pay | Admitting: Nurse Practitioner

## 2021-07-09 ENCOUNTER — Other Ambulatory Visit: Payer: Self-pay | Admitting: Nurse Practitioner

## 2021-07-09 DIAGNOSIS — G8929 Other chronic pain: Secondary | ICD-10-CM

## 2021-07-09 DIAGNOSIS — R109 Unspecified abdominal pain: Secondary | ICD-10-CM

## 2021-07-09 DIAGNOSIS — M545 Low back pain, unspecified: Secondary | ICD-10-CM

## 2021-07-09 DIAGNOSIS — K824 Cholesterolosis of gallbladder: Secondary | ICD-10-CM

## 2021-07-10 LAB — SPECIMEN STATUS REPORT

## 2021-07-10 LAB — H PYLORI, IGM, IGG, IGA AB
H pylori, IgM Abs: <9 units (ref 0.0–8.9)
H. pylori, IgA Abs: <9 units (ref 0.0–8.9)
H. pylori, IgG AbS: 0.38 Index Value (ref 0.00–0.79)

## 2021-07-10 MED FILL — Metoprolol Succinate Tab ER 24HR 25 MG (Tartrate Equiv): ORAL | 90 days supply | Qty: 45 | Fill #1 | Status: AC

## 2021-07-13 ENCOUNTER — Ambulatory Visit: Payer: Self-pay

## 2021-07-13 ENCOUNTER — Telehealth: Payer: Self-pay | Admitting: Nurse Practitioner

## 2021-07-13 ENCOUNTER — Other Ambulatory Visit: Payer: Self-pay

## 2021-07-13 NOTE — Telephone Encounter (Signed)
Pt. Reports she is still having abdominal pain. Pain is more constant. Has not heard back on an appointment for abdominal ultrasound.Given scheduling's phone number . Please advise.  Answer Assessment - Initial Assessment Questions 1. LOCATION: "Where does it hurt?"      All over 2. RADIATION: "Does the pain shoot anywhere else?" (e.g., chest, back)     Back 3. ONSET: "When did the pain begin?" (e.g., minutes, hours or days ago)      Last week 4. SUDDEN: "Gradual or sudden onset?"     Gradual 5. PATTERN "Does the pain come and go, or is it constant?"    - If constant: "Is it getting better, staying the same, or worsening?"      (Note: Constant means the pain never goes away completely; most serious pain is constant and it progresses)     - If intermittent: "How long does it last?" "Do you have pain now?"     (Note: Intermittent means the pain goes away completely between bouts)     Constant now 6. SEVERITY: "How bad is the pain?"  (e.g., Scale 1-10; mild, moderate, or severe)   - MILD (1-3): doesn't interfere with normal activities, abdomen soft and not tender to touch    - MODERATE (4-7): interferes with normal activities or awakens from sleep, abdomen tender to touch    - SEVERE (8-10): excruciating pain, doubled over, unable to do any normal activities      Now - 6 7. RECURRENT SYMPTOM: "Have you ever had this type of stomach pain before?" If Yes, ask: "When was the last time?" and "What happened that time?"      Yes 8. CAUSE: "What do you think is causing the stomach pain?"     Unsure 9. RELIEVING/AGGRAVATING FACTORS: "What makes it better or worse?" (e.g., movement, antacids, bowel movement)     No 10. OTHER SYMPTOMS: "Do you have any other symptoms?" (e.g., back pain, diarrhea, fever, urination pain, vomiting)       Diarrhea 11. PREGNANCY: "Is there any chance you are pregnant?" "When was your last menstrual period?"       No  Protocols used: Abdominal Pain - [facility]

## 2021-07-13 NOTE — Telephone Encounter (Signed)
Called and made pt aware of her ultrasound appt tomorrow.

## 2021-07-13 NOTE — Progress Notes (Signed)
Called to schedule an ultrasound appointment and the scheduler told me that she already had an appointment.

## 2021-07-13 NOTE — Telephone Encounter (Signed)
Called pt and let me know the new stool kit would be in the front for pick up.

## 2021-07-13 NOTE — Telephone Encounter (Signed)
Copied from [place] [id number]. Topic: General - Other >> Jul 13, 2021  9:59 AM Leward Quan A wrote: Reason for CRM: Patient called in to inform Geryl Rankins that she will need a new stool sample kit since she disposed of the last one because she did not have the orders as stated on the test to sen to the lab. Would like a call back when she can pick up sample kit. Please call Ph# [phone]

## 2021-07-14 ENCOUNTER — Other Ambulatory Visit: Payer: Self-pay

## 2021-07-14 ENCOUNTER — Ambulatory Visit (HOSPITAL_BASED_OUTPATIENT_CLINIC_OR_DEPARTMENT_OTHER)
Admission: RE | Admit: 2021-07-14 | Discharge: 2021-07-14 | Disposition: A | Discharge status: Home / Self Care | Payer: Self-pay | Source: Ambulatory Visit | Attending: Nurse Practitioner | Admitting: Nurse Practitioner

## 2021-07-14 DIAGNOSIS — K824 Cholesterolosis of gallbladder: Secondary | ICD-10-CM | POA: Insufficient documentation

## 2021-07-14 DIAGNOSIS — G8929 Other chronic pain: Secondary | ICD-10-CM | POA: Insufficient documentation

## 2021-07-14 DIAGNOSIS — M545 Low back pain, unspecified: Secondary | ICD-10-CM | POA: Insufficient documentation

## 2021-07-14 DIAGNOSIS — R109 Unspecified abdominal pain: Secondary | ICD-10-CM | POA: Insufficient documentation

## 2021-07-15 ENCOUNTER — Telehealth: Payer: Self-pay | Admitting: Nurse Practitioner

## 2021-07-15 NOTE — Telephone Encounter (Signed)
Copied from [place] [id number]. Topic: General - Other >> Jul 15, 2021 11:38 AM Holley Dexter N wrote: Reason for CRM: Pt called in about the pain she is having that she believes they are in her intestine, although she did have a [place] tat should everything was okay, she is still having the pain and it has moved lower and wants to see about getting another look and wanted PCP to give her a call back to talk about some options.

## 2021-07-15 NOTE — Telephone Encounter (Signed)
Called pt and made an appt 07/17/2021 at 8:10 tele visit for her to talk to [facility] about out other options for pain.

## 2021-07-16 ENCOUNTER — Telehealth: Payer: Self-pay | Admitting: Gastroenterology

## 2021-07-16 NOTE — Telephone Encounter (Signed)
The pt states she has generalized abd pain for the past 3 weeks. She saw her PCP who ordered an abd [place].  She was told it was normal.  She also had recent blood work and was told that was normal as well.  She was put on prilosec 40 mg daily.  She has been scheduled to see Dr Ardis Hughs on 10/28. She will follow the recommendations per her PCP in the meantime.

## 2021-07-16 NOTE — Telephone Encounter (Signed)
Inbound call from pt requesting a call back stating that she is having a change in her bm. She is also experiencing some abd pain. Please advise. Thank you.

## 2021-07-17 ENCOUNTER — Other Ambulatory Visit: Payer: Self-pay

## 2021-07-17 ENCOUNTER — Ambulatory Visit: Payer: Self-pay | Attending: Nurse Practitioner | Admitting: Nurse Practitioner

## 2021-07-17 ENCOUNTER — Encounter: Payer: Self-pay | Admitting: Nurse Practitioner

## 2021-07-17 DIAGNOSIS — R1084 Generalized abdominal pain: Secondary | ICD-10-CM

## 2021-07-17 DIAGNOSIS — R195 Other fecal abnormalities: Secondary | ICD-10-CM
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
## 2021-07-24 ENCOUNTER — Telehealth: Payer: Self-pay | Admitting: Nurse Practitioner

## 2021-07-24 ENCOUNTER — Ambulatory Visit: Payer: Self-pay | Admitting: *Deleted

## 2021-07-24 NOTE — Telephone Encounter (Signed)
Patient is calling with questions: can she get further testing before GI appointment, can she come to get stool kit and does she need to continue high dose omeprazole or can she decrease to lower dose.  Reason for Disposition  Abdominal pain is a chronic symptom (recurrent or ongoing AND present > 4 weeks)  Answer Assessment - Initial Assessment Questions 1. LOCATION: "Where does it hurt?"      Lower abdomen and back pain 2. RADIATION: "Does the pain shoot anywhere else?" (e.g., chest, back)     Into back 3. ONSET: "When did the pain begin?" (e.g., minutes, hours or days ago)      Over 1 month 4. SUDDEN: "Gradual or sudden onset?"     N/a 5. PATTERN "Does the pain come and go, or is it constant?"    - If constant: "Is it getting better, staying the same, or worsening?"      (Note: Constant means the pain never goes away completely; most serious pain is constant and it progresses)     - If intermittent: "How long does it last?" "Do you have pain now?"     (Note: Intermittent means the pain goes away completely between bouts)     Comes and goes- maybe 2 hours- still feels bloating,gas pressure, hip pain 6. SEVERITY: "How bad is the pain?"  (e.g., Scale 1-10; mild, moderate, or severe)   - MILD (1-3): doesn't interfere with normal activities, abdomen soft and not tender to touch    - MODERATE (4-7): interferes with normal activities or awakens from sleep, abdomen tender to touch    - SEVERE (8-10): excruciating pain, doubled over, unable to do any normal activities      Same- comes in waves- 7-8 then eases down 7. RECURRENT SYMPTOM: "Have you ever had this type of stomach pain before?" If Yes, ask: "When was the last time?" and "What happened that time?"      2 years ago- diagnosed as gastritis 8. CAUSE: "What do you think is causing the stomach pain?"     GI cause 9. RELIEVING/AGGRAVATING FACTORS: "What makes it better or worse?" (e.g., movement, antacids, bowel movement)     Bowels are  more normal now 10. OTHER SYMPTOMS: "Do you have any other symptoms?" (e.g., back pain, diarrhea, fever, urination pain, vomiting)       Patient feels pressure as food moves through the intestines 11. PREGNANCY: "Is there any chance you are pregnant?" "When was your last menstrual period?"       N/a  Protocols used: Abdominal Pain - [facility]

## 2021-07-24 NOTE — Telephone Encounter (Signed)
Second attempt to call patient- left message to call office. 

## 2021-07-24 NOTE — Telephone Encounter (Signed)
Pt is calling and she still have abd pain. Pt seen zelda on 07-17-2021 and has an appt with GI specialist on 08-21-2021. Pt is requesting ct scan of intestines and phone notes has been sent to zelda. Please advise   Attempted to call patient- left message to return call to office.

## 2021-07-24 NOTE — Telephone Encounter (Signed)
Per provider if imaging is needed GI will order them.

## 2021-07-24 NOTE — Telephone Encounter (Addendum)
Pt seen zelda on 07-17-2021 and does have an appt with GI specialist on 08-21-2021 and would like to know if zelda would order CTscan of her intestines. Pt is no better

## 2021-07-28 ENCOUNTER — Telehealth: Payer: Self-pay | Admitting: Gastroenterology

## 2021-07-28 NOTE — Telephone Encounter (Signed)
She is welcome to decrease to 20 mg per day if no symptoms of worsening GERD. No additional testing until seen by GI. Thank you. If she is having any acute or emergent symptoms needs to be seen in urgent care or ED

## 2021-07-28 NOTE — Telephone Encounter (Signed)
The pt PCP prescribed omeprazole 40 mg and the pt is not sure how long she should be on it.  I have advised the pt to call the PCP who prescribed the omeprazole to discuss how long she should stay on the med.  The pt has been advised of the information and verbalized understanding.  She also confirms that she will keep the appt with Dr Ardis Hughs on 10/28 as scheduled.

## 2021-07-28 NOTE — Telephone Encounter (Signed)
Inbound call from patient with questions about omeprazole 40mg  that patient is currently taking.

## 2021-07-29 ENCOUNTER — Other Ambulatory Visit: Payer: Self-pay | Admitting: Nurse Practitioner

## 2021-07-29 MED ORDER — OMEPRAZOLE 20 MG PO CPDR: 20.0000 mg | DELAYED_RELEASE_CAPSULE | Freq: Every day | ORAL | 3 refills | Status: DC

## 2021-07-29 MED ORDER — OMEPRAZOLE 20 MG PO CPDR
20.0000 mg | DELAYED_RELEASE_CAPSULE | Freq: Every day | ORAL | 3 refills | Status: DC
  Filled 2021-07-29: qty 30, 30d supply, fill #0

## 2021-07-29 NOTE — Telephone Encounter (Signed)
It's her choice. Prescription has been sent or she can pick up over the counter. Thanks

## 2021-07-30 ENCOUNTER — Encounter: Payer: Self-pay | Admitting: Nurse Practitioner

## 2021-07-30 ENCOUNTER — Other Ambulatory Visit: Payer: Self-pay

## 2021-07-31 NOTE — Telephone Encounter (Signed)
Pt stated she picked up prescription today

## 2021-08-03 ENCOUNTER — Encounter: Payer: Self-pay | Admitting: Nurse Practitioner

## 2021-08-03 ENCOUNTER — Other Ambulatory Visit: Payer: Self-pay | Admitting: Nurse Practitioner

## 2021-08-03 DIAGNOSIS — Z8742 Personal history of other diseases of the female genital tract: Secondary | ICD-10-CM

## 2021-08-03 DIAGNOSIS — R140 Abdominal distension (gaseous): Secondary | ICD-10-CM

## 2021-08-04 ENCOUNTER — Encounter: Payer: Self-pay | Admitting: Nurse Practitioner

## 2021-08-04 ENCOUNTER — Other Ambulatory Visit: Payer: Self-pay | Admitting: Nurse Practitioner

## 2021-08-05 ENCOUNTER — Telehealth: Payer: Self-pay | Admitting: *Deleted

## 2021-08-05 NOTE — Telephone Encounter (Signed)
Copied from [place] [id number]. Topic: General - Other >> Jul 28, 2021  8:49 AM Valere Dross wrote: Reason for CRM: Pt called in wanting to speak with [name] asap about multiple issues she's been having, please advise.

## 2021-08-05 NOTE — Telephone Encounter (Signed)
Copied from [place] [id number]. Topic: General - Other >> Aug 05, 2021 10:14 AM Yvette Rack wrote: Reason for CRM: Pt requests results from stool sample. Cb# [phone]

## 2021-08-06 NOTE — Telephone Encounter (Signed)
Do we have the results in yet?

## 2021-08-06 NOTE — Telephone Encounter (Signed)
Please refer to lab results. They are not back. Please let her know she does not have to call every day. We will let her know when the lab lets [place] know

## 2021-08-06 NOTE — Telephone Encounter (Signed)
Call placed to patient.  She had been speaking with Valli Glance, RN and her issues have been addressed. She is waiting for results of stool sample and she said that Ms Raul Del, NP has ordered a pelvic ultrasound for her.   No other questions/concerns reported at this time

## 2021-08-07 NOTE — Telephone Encounter (Signed)
Called to inform the pt that the results are not in yet, and we will call her when we receive them.

## 2021-08-11 ENCOUNTER — Other Ambulatory Visit: Payer: Self-pay

## 2021-08-11 ENCOUNTER — Ambulatory Visit (HOSPITAL_BASED_OUTPATIENT_CLINIC_OR_DEPARTMENT_OTHER)
Admission: RE | Admit: 2021-08-11 | Discharge: 2021-08-11 | Disposition: A | Discharge status: Home / Self Care | Payer: Self-pay | Source: Ambulatory Visit | Attending: Nurse Practitioner | Admitting: Nurse Practitioner

## 2021-08-11 DIAGNOSIS — Z8742 Personal history of other diseases of the female genital tract: Secondary | ICD-10-CM | POA: Insufficient documentation

## 2021-08-11 DIAGNOSIS — R140 Abdominal distension (gaseous): Secondary | ICD-10-CM | POA: Insufficient documentation

## 2021-08-17 ENCOUNTER — Ambulatory Visit: Payer: Self-pay

## 2021-08-17 NOTE — Telephone Encounter (Signed)
Spoke with Ms. Caponigro. All questions answered at this time

## 2021-08-17 NOTE — Telephone Encounter (Signed)
Pt was tested and diagnosed with [facility] at [facility] yesterday. Pt was prescribed Paxlovid and  Mucinex by [facility] provider.  Pt is hesitant to take antiviral. Pt has had gastric issues, and had an office visit scheduled for later this week for same. Appointment had to be cancelled d/t COVID diagnosis.  Pt also has Afib and is worried about an interactions with medications prescribed.   Please advise.       Reason for Disposition  [1] COVID-19 diagnosed by positive lab test (e.g., PCR, rapid self-test kit) AND [2] mild symptoms (e.g., cough, fever, others) AND [2] no complications or SOB  Answer Assessment - Initial Assessment Questions 1. COVID-19 DIAGNOSIS: "Who made your COVID-19 diagnosis?" "Was it confirmed by a positive lab test or self-test?" If not diagnosed by a doctor (or NP/PA), ask "Are there lots of cases (community spread) where you live?" Note: See public health department website, if unsure.     [facility] test [facility] 2. COVID-19 EXPOSURE: "Was there any known exposure to COVID before the symptoms began?" CDC Definition of close contact: within 6 feet (2 meters) for a total of 15 minutes or more over a 24-hour period.      na 3. ONSET: "When did the COVID-19 symptoms start?"      2 days ago 4. WORST SYMPTOM: "What is your worst symptom?" (e.g., cough, fever, shortness of breath, muscle aches)     Fever, chills, HA, fatigue,  5. COUGH: "Do you have a cough?" If Yes, ask: "How bad is the cough?"       yes 6.Do you have a fever?" If Yes, ask: "What is your temperature, how was it measured, and when did it start?"     Yesterday - resolved 7. RESPIRATORY STATUS: "Describe your breathing?" (e.g., shortness of breath, wheezing, unable to speak)      ok 8. BETTER-SAME-WORSE: "Are you getting better, staying the same or getting worse compared to yesterday?"  If getting worse, ask, "In what way?"     better 9. HIGH RISK DISEASE: "Do you have any chronic medical problems?" (e.g., asthma,  heart or lung disease, weak immune system, obesity, etc.)     Afib 10. VACCINE: "Have you had the COVID-19 vaccine?" If Yes, ask: "Which one, how many shots, when did you get it?"        11. BOOSTER: "Have you received your COVID-19 booster?" If Yes, ask: "Which one and when did you get it?"        12. PREGNANCY: "Is there any chance you are pregnant?" "When was your last menstrual period?"        13. OTHER SYMPTOMS: "Do you have any other symptoms?"  (e.g., chills, fatigue, headache, loss of smell or taste, muscle pain, sore throat)        14. O2 SATURATION MONITOR:  "Do you use an oxygen saturation monitor (pulse oximeter) at home?" If Yes, ask "What is your reading (oxygen level) today?" "What is your usual oxygen saturation reading?" (e.g., 95%)  Protocols used: Coronavirus (COVID-19) Diagnosed or Suspected-A-AH

## 2021-08-18 ENCOUNTER — Telehealth: Payer: Self-pay | Admitting: *Deleted

## 2021-08-18 NOTE — Telephone Encounter (Signed)
Copied from [place] [id number]. Topic: General - Other >> Aug 17, 2021  1:03 PM Leward Quan A wrote: Reason for CRM: Patient called in to inform Geryl Rankins that she tested positive for Covid and was prescribed Paxlovid and Mucinex. Symptoms started on 08/16/21 with slight headache, low grade fever, cough and sensitivity to touch. But feel better today except for the cough. Please advise Ph# [phone]

## 2021-08-19 NOTE — Telephone Encounter (Signed)
Noted  

## 2021-08-21 ENCOUNTER — Ambulatory Visit: Payer: Self-pay | Admitting: Gastroenterology

## 2021-08-27 ENCOUNTER — Telehealth: Payer: Self-pay | Admitting: Physician Assistant

## 2021-08-27 ENCOUNTER — Ambulatory Visit: Payer: Self-pay

## 2021-08-27 DIAGNOSIS — R058 Other specified cough: Secondary | ICD-10-CM

## 2021-08-27 MED ORDER — PREDNISONE 20 MG PO TABS: 20.0000 mg | ORAL_TABLET | Freq: Every day | ORAL | 0 refills | Status: DC

## 2021-08-27 NOTE — Telephone Encounter (Signed)
Pt called, stated that her cough isn't getting better and has been going on for 5 days. Little to none mucus coming but, she feels really dry in her throat and chest but when she coughs she can hear the rhapsiness and is concerned whether these symptoms will continue to last for a while for does she have a secondary infection. Pt was diagnosed COVID + on 08/17/21 and has taken the prescribed anti-virals. She says she feels better but just doesn't understand the cough. She denies having and SOB or chest pain. Attempted to schedule pt virtually with PCP but no appt available with PCP or other providers. Advised pt she could do virtual UC visit or Mychart E-visit. Pt states she will do virtual UC visit. Care advice given and pt verbalized understanding.    Message from [place] sent at 08/27/2021  4:53 PM EDT  Patient called in says still has lingering covid symptoms for 4 days, also has yellow phlegm also  coughing dryness irritation in chest.  She wanats to know if she needs more medicine. Please call back    Reason for Disposition  Cough has been present for > 3 weeks  Answer Assessment - Initial Assessment Questions 1. ONSET: "When did the cough begin?"      5 days ago  2. SEVERITY: "How bad is the cough today?"      Not bad 3. SPUTUM: "Describe the color of your sputum" (none, dry cough; clear, white, yellow, green)     Yellow thin-thick 4. HEMOPTYSIS: "Are you coughing up any blood?" If so ask: "How much?" (flecks, streaks, tablespoons, etc.)     NO 5. DIFFICULTY BREATHING: "Are you having difficulty breathing?" If Yes, ask: "How bad is it?" (e.g., mild, moderate, severe)    - MILD: No SOB at rest, mild SOB with walking, speaks normally in sentences, can lie down, no retractions, pulse < 100.    - MODERATE: SOB at rest, SOB with minimal exertion and prefers to sit, cannot lie down flat, speaks in phrases, mild retractions, audible wheezing, pulse 100-120.    - SEVERE: Very SOB at  rest, speaks in single words, struggling to breathe, sitting hunched forward, retractions, pulse > 120      No 6. FEVER: "Do you have a fever?" If Yes, ask: "What is your temperature, how was it measured, and when did it start?"     No 7. CARDIAC HISTORY: "Do you have any history of heart disease?" (e.g., heart attack, congestive heart failure)      A-fib 8. LUNG HISTORY: "Do you have any history of lung disease?"  (e.g., pulmonary embolus, asthma, emphysema)     No 9. PE RISK FACTORS: "Do you have a history of blood clots?" (or: recent major surgery, recent prolonged travel, bedridden)     No 10. OTHER SYMPTOMS: "Do you have any other symptoms?" (e.g., runny nose, wheezing, chest pain)       COVID 1 week and 4 days, tickle in throat/chest  11. PREGNANCY: "Is there any chance you are pregnant?" "When was your last menstrual period?"       No 12. TRAVEL: "Have you traveled out of the country in the last month?" (e.g., travel history, exposures)       No  Protocols used: Cough - Acute Productive-A-AH, Cough - Acute Non-Productive-A-AH

## 2021-08-27 NOTE — Patient Instructions (Signed)
  [name], thank you for joining Mar Daring, PA-C for today's virtual visit.  While this provider is not your primary care provider (PCP), if your PCP is located in our provider database this encounter information will be shared with them immediately following your visit.  Consent: (Patient) [name] provided verbal consent for this virtual visit at the beginning of the encounter.  Current Medications:  Current Outpatient Medications:    predniSONE (DELTASONE) 20 MG tablet, Take 1 tablet (20 mg total) by mouth daily with breakfast., Disp: 7 tablet, Rfl: 0   Ascorbic Acid (VITAMIN C) 1000 MG tablet, Take 1,000 mg by mouth daily., Disp: , Rfl:    Cholecalciferol (D3-1000 PO), Take 1 tablet by mouth daily., Disp: , Rfl:    loratadine (CLARITIN) 10 MG tablet, Take 10 mg by mouth daily., Disp: , Rfl:    metoprolol succinate (TOPROL-XL) 25 MG 24 hr tablet, TAKE 1/2 TABLETS (12.5 MG TOTAL) BY MOUTH DAILY., Disp: 45 tablet, Rfl: 3   omeprazole (PRILOSEC) 20 MG capsule, Take 1 capsule (20 mg total) by mouth daily., Disp: 30 capsule, Rfl: 3   triamcinolone cream (KENALOG) 0.1 %, Apply 1 application topically 2 (two) times daily., Disp: 60 g, Rfl: 1   Medications ordered in this encounter:  Meds ordered this encounter  Medications   predniSONE (DELTASONE) 20 MG tablet    Sig: Take 1 tablet (20 mg total) by mouth daily with breakfast.    Dispense:  7 tablet    Refill:  0    Order Specific Question:   Supervising Provider    Answer:   Noemi Chapel [[id number]]     *If you need refills on other medications prior to your next appointment, please contact your pharmacy*  Follow-Up: Call back or seek an in-person evaluation if the symptoms worsen or if the condition fails to improve as anticipated.  Other Instructions It sounds like you may have post-viral cough syndrome. This is a chronic cough after a moderate upper respiratory infection that is caused from residual  inflammation in the airway from the previous infection.  If you have been instructed to have an in-person evaluation today at a local Urgent Care facility, please use the link below. It will take you to a list of all of our available [facility], including address, phone number and hours of operation. Please do not delay care.  [facility]  If you or a family member do not have a primary care provider, use the link below to schedule a visit and establish care. When you choose a [facility] primary care physician or advanced practice provider, you gain a long-term partner in health. Find a Primary Care Provider  Learn more about [facility]'s in-office and virtual care options: [facility]

## 2021-08-27 NOTE — Progress Notes (Signed)
Virtual Visit Consent   [name], you are scheduled for a virtual visit with a [facility] provider today.     Just as with appointments in the office, your consent must be obtained to participate.  Your consent will be active for this visit and any virtual visit you may have with one of our providers in the next 365 days.     If you have a MyChart account, a copy of this consent can be sent to you electronically.  All virtual visits are billed to your insurance company just like a traditional visit in the office.    As this is a virtual visit, video technology does not allow for your provider to perform a traditional examination.  This may limit your provider's ability to fully assess your condition.  If your provider identifies any concerns that need to be evaluated in person or the need to arrange testing (such as labs, EKG, etc.), we will make arrangements to do so.     Although advances in technology are sophisticated, we cannot ensure that it will always work on either your end or our end.  If the connection with a video visit is poor, the visit may have to be switched to a telephone visit.  With either a video or telephone visit, we are not always able to ensure that we have a secure connection.     I need to obtain your verbal consent now.   Are you willing to proceed with your visit today?    [name] has provided verbal consent on 08/27/2021 for a virtual visit (video or telephone).   Mar Daring, PA-C   Date: 08/27/2021 6:59 PM   Virtual Visit via Video Note   I, Mar Daring, connected with  [name]  ([id number], 12/02/1960) on 08/27/21 at  6:15 PM EDT by a video-enabled telemedicine application and verified that I am speaking with the correct person using two identifiers.  Location: Patient: Virtual Visit Location Patient: Home Provider: Virtual Visit Location Provider: Home Office   I discussed the limitations of evaluation  and management by telemedicine and the availability of in person appointments. The patient expressed understanding and agreed to proceed.   Interactive audio and video communications were attempted, although failed due to patient's inability to connect to video. Continued visit with audio only interaction with patient agreement.   History of Present Illness: [name] is a 60 y.o. who identifies as a female who was assigned female at birth, and is being seen today for cough. Was diagnosed with Covid 19 on 08/17/21. Symptoms started 08/16/21. Still having dry, hacking cough. 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 Reports all other symptoms have improved.    Problems:  Patient Active Problem List   Diagnosis Date Noted   Abdominal pain, epigastric 05/28/2019   Bloating 05/28/2019   Suprapubic abdominal pain 05/28/2019   Gallbladder polyp 05/28/2019   Family history of abdominal aortic aneurysm (AAA) 05/17/2017   Elevated C-reactive protein (CRP) 11/01/2016   Dilated aortic root ([place]) 11/01/2016   Pruritic rash 07/06/2016   Electrocution 08/26/2015   Left-sided low back pain without sciatica 08/26/2015   Post-nasal drip 08/26/2015   Pain, dental 07/15/2015   De Quervain's tenosynovitis, right 04/10/2015   Pain in joint, shoulder region 02/22/2014   Aortic insufficiency 01/29/2014   Atrial fibrillation ([place]) 01/09/2014   Chest pain 01/09/2014   OTHER AND UNSPECIFIED OVARIAN CYST 09/08/2010   GERD 08/10/2010  Anxiety state 02/02/2010    Allergies:  Allergies  Allergen Reactions   Azithromycin     Patient told not to take this med due to A-fib.   Sausage [Pickled Meat] Other (See Comments)    Causes migraines   Sulfamethoxazole Other (See Comments)    flushing   Medications:  Current Outpatient Medications:    predniSONE (DELTASONE) 20 MG tablet, Take 1 tablet (20 mg total) by mouth daily with breakfast., Disp: 7 tablet, Rfl: 0   Ascorbic Acid  (VITAMIN C) 1000 MG tablet, Take 1,000 mg by mouth daily., Disp: , Rfl:    Cholecalciferol (D3-1000 PO), Take 1 tablet by mouth daily., Disp: , Rfl:    loratadine (CLARITIN) 10 MG tablet, Take 10 mg by mouth daily., Disp: , Rfl:    metoprolol succinate (TOPROL-XL) 25 MG 24 hr tablet, TAKE 1/2 TABLETS (12.5 MG TOTAL) BY MOUTH DAILY., Disp: 45 tablet, Rfl: 3   omeprazole (PRILOSEC) 20 MG capsule, Take 1 capsule (20 mg total) by mouth daily., Disp: 30 capsule, Rfl: 3   triamcinolone cream (KENALOG) 0.1 %, Apply 1 application topically 2 (two) times daily., Disp: 60 g, Rfl: 1  Observations/Objective: Patient is well-developed, well-nourished in no acute distress.  Resting comfortably at home.  Head is normocephalic, atraumatic.  No labored breathing.  Speech is clear and coherent with logical content.  Patient is alert and oriented at baseline.    Assessment and Plan: 1. Post-viral cough syndrome - predniSONE (DELTASONE) 20 MG tablet; Take 1 tablet (20 mg total) by mouth daily with breakfast.  Dispense: 7 tablet; Refill: 0  - Suspect post viral cough syndrome following recent covid 19 infection - Prednisone added - Continue symptomatic OTC management of choice - If breathing or other symptoms return or worsen please seek in person evaluation   Follow Up Instructions: I discussed the assessment and treatment plan with the patient. The patient was provided an opportunity to ask questions and all were answered. The patient agreed with the plan and demonstrated an understanding of the instructions.  A copy of instructions were sent to the patient via MyChart unless otherwise noted below.    The patient was advised to call back or seek an in-person evaluation if the symptoms worsen or if the condition fails to improve as anticipated.  Time:  I spent 21 minutes with the patient via telehealth technology discussing the above problems/concerns.    Mar Daring, PA-C

## 2021-09-02 ENCOUNTER — Ambulatory Visit (INDEPENDENT_AMBULATORY_CARE_PROVIDER_SITE_OTHER): Payer: Self-pay | Admitting: Gastroenterology

## 2021-09-02 ENCOUNTER — Encounter: Payer: Self-pay | Admitting: Gastroenterology

## 2021-09-02 ENCOUNTER — Ambulatory Visit: Payer: Self-pay | Admitting: Specialist

## 2021-09-02 VITALS — BP 90/62 | HR 77 | Ht 68.0 in | Wt 169.4 lb

## 2021-09-02 DIAGNOSIS — K589 Irritable bowel syndrome without diarrhea: Secondary | ICD-10-CM

## 2021-09-02 NOTE — Patient Instructions (Signed)
If you are age 59 or younger, your body mass index should be between 19-25. Your Body mass index is 25.75 kg/m. If this is out of the aformentioned range listed, please consider follow up with your Primary Care Provider.  ________________________________________________________  The [facility] providers would like to encourage you to use [facility] to communicate with providers for non-urgent requests or questions.  Due to long hold times on the telephone, sending your provider a message by [facility] may be a faster and more efficient way to get a response.  Please allow 48 business hours for a response.  Please remember that this is for non-urgent requests.  _______________________________________________________  Dennis Bast will return to our office on an as needed basis.  Please contact our office if abdominal pains return and will order a CT scan.  Thank you for entrusting me with your care and choosing [facility].  Dr Ardis Hughs
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
## 2021-09-06 ENCOUNTER — Telehealth: Payer: Self-pay | Admitting: Physician Assistant

## 2021-09-06 ENCOUNTER — Encounter: Payer: Self-pay | Admitting: Physician Assistant

## 2021-09-06 DIAGNOSIS — R059 Cough, unspecified: Secondary | ICD-10-CM

## 2021-09-06 DIAGNOSIS — J018 Other acute sinusitis: Secondary | ICD-10-CM

## 2021-09-06 MED ORDER — BENZONATATE 100 MG PO CAPS: 100.0000 mg | ORAL_CAPSULE | Freq: Three times a day (TID) | ORAL | 0 refills | Status: DC | PRN

## 2021-09-06 MED ORDER — AMOXICILLIN-POT CLAVULANATE 875-125 MG PO TABS: 1.0000 | ORAL_TABLET | Freq: Two times a day (BID) | ORAL | 0 refills | Status: DC
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
## 2021-09-06 NOTE — Progress Notes (Signed)
Virtual Visit via Telephone Note  I connected with Amber Millet on 09/06/21 at  1:45 PM EST by telephone and verified that I am speaking with the correct person using two identifiers.  Location: Patient: patient's home Provider: provider's office   I discussed the limitations, risks, security and privacy concerns of performing an evaluation and management service by telephone and the availability of in person appointments. I also discussed with the patient that there may be a patient responsible charge related to this service. The patient expressed understanding and agreed to proceed.   History of Present Illness: 60 yo F connects via phone, in NAD, with PMH of covid 19 diagnosed 3 weeks ago, complains of  cough, nasal congestion, sneezing, runny nose, post nasal drainage x 2 weeks, then developed  L ear fullness and pain since today. Rhinorrhea is yellow/green and has associated sinus tenderness. cough is with yellow phlem. Did visit 1 week ago and prescribed Prednisone, which she states helped the cough but continues to have lingering cough.  Has taken throat lozenges and  tylenol.  Denies any fever, chills, dyspnea, cp, or any known re exposure to someone with Covid 19 or any other illness.   Observations/Objective: Talked to pt on phone.. some coughing while talking.  Assessment and Plan: Pt does not sound in any acute respiratory distress- speaks in full sentences.advised pt to palpate her sinuses- she admits to maxillary sinus pressure.  Follow Up Instructions: Will treat for sinus infection with Augmentin and cough with Benzonatate. Advised if sxs continue, new symptoms or worsening of symptoms, to have a face to face follow up with her doctor, in [place] or ER.   I discussed the assessment and treatment plan with the patient. The patient was provided an opportunity to ask questions and all were answered. The patient agreed with the plan and demonstrated an understanding of the  instructions.   The patient was advised to call back or seek an in-person evaluation if the symptoms worsen or if the condition fails to improve as anticipated.  I provided 15 minutes of non-face-to-face time during this encounter.   Waldon Merl, PA-C

## 2021-09-12 ENCOUNTER — Telehealth: Payer: Self-pay | Admitting: Nurse Practitioner

## 2021-09-12 DIAGNOSIS — R0602 Shortness of breath: Secondary | ICD-10-CM

## 2021-09-12 MED ORDER — FLUTICASONE PROPIONATE 50 MCG/ACT NA SUSP
2.0000 | Freq: Every day | NASAL | 6 refills | Status: DC
Start: 2021-09-12 — End: 2021-11-23

## 2021-09-12 MED ORDER — LEVALBUTEROL TARTRATE 45 MCG/ACT IN AERO: 2.0000 | INHALATION_SPRAY | RESPIRATORY_TRACT | 12 refills | Status: DC | PRN

## 2021-09-12 NOTE — Progress Notes (Signed)
Virtual Visit Consent   [name], you are scheduled for a virtual visit with Mary-Margaret Hassell Done, [place], a [facility] provider, today.     Just as with appointments in the office, your consent must be obtained to participate.  Your consent will be active for this visit and any virtual visit you may have with one of our providers in the next 365 days.     If you have a MyChart account, a copy of this consent can be sent to you electronically.  All virtual visits are billed to your insurance company just like a traditional visit in the office.    As this is a virtual visit, video technology does not allow for your provider to perform a traditional examination.  This may limit your provider's ability to fully assess your condition.  If your provider identifies any concerns that need to be evaluated in person or the need to arrange testing (such as labs, EKG, etc.), we will make arrangements to do so.     Although advances in technology are sophisticated, we cannot ensure that it will always work on either your end or our end.  If the connection with a video visit is poor, the visit may have to be switched to a telephone visit.  With either a video or telephone visit, we are not always able to ensure that we have a secure connection.     I need to obtain your verbal consent now.   Are you willing to proceed with your visit today? YES   MARGOT ORIORDAN has provided verbal consent on 09/12/2021 for a virtual visit (video or telephone).   Mary-Margaret Hassell Done, FNP   Date: 09/12/2021 11:23 AM   Virtual Visit via Video Note   I, Mary-Margaret Hassell Done, connected with [name] ([id number], [birth year]) on 09/12/21 at 12:00 PM EST by a video-enabled telemedicine application and verified that I am speaking with the correct person using two identifiers.  Location: Patient: Virtual Visit Location Patient: Home Provider: Virtual Visit Location Provider: Mobile   I discussed  the limitations of evaluation and management by telemedicine and the availability of in person appointments. The patient expressed understanding and agreed to proceed.    History of Present Illness: [name] is a 60 y.o. who identifies as a female who was assigned female at birth, and is being seen today for SOB.  HPI: Patient tested positive for covid on 08/17/21. She had video visit on 09/04/21 and was given prednisone 20mg  1 daily for 10 days. Was getting no better so did another video visit on 09/06/21 and was given Augmentin. Her problems now is that when she breathes she doe snot feel like she is getting a full breath.she had a nurse listen to her lungs and she was told they sound strong and clear. Was wondering is she should go get a chest xray, she has a history of paroxymal atrial fib and has not had an episode in 4 years. She is a little afraid that meds will send her into atrial fib. She denies her heart racing or chest pain. No fever   Problems:  Patient Active Problem List   Diagnosis Date Noted   Abdominal pain, epigastric 05/28/2019   Bloating 05/28/2019   Suprapubic abdominal pain 05/28/2019   Gallbladder polyp 05/28/2019   Family history of abdominal aortic aneurysm (AAA) 05/17/2017   Elevated C-reactive protein (CRP) 11/01/2016   Dilated aortic root ([place]) 11/01/2016   Pruritic rash 07/06/2016  Electrocution 08/26/2015   Left-sided low back pain without sciatica 08/26/2015   Post-nasal drip 08/26/2015   Pain, dental 07/15/2015   De Quervain's tenosynovitis, right 04/10/2015   Pain in joint, shoulder region 02/22/2014   Aortic insufficiency 01/29/2014   Atrial fibrillation ([place]) 01/09/2014   Chest pain 01/09/2014   OTHER AND UNSPECIFIED OVARIAN CYST 09/08/2010   GERD 08/10/2010   Anxiety state 02/02/2010    Allergies:  Allergies  Allergen Reactions   Azithromycin     Patient told not to take this med due to A-fib.   Sausage [Pickled Meat] Other (See  Comments)    Causes migraines   Sulfamethoxazole Other (See Comments)    flushing   Medications:  Current Outpatient Medications:    amoxicillin-clavulanate (AUGMENTIN) 875-125 MG tablet, Take 1 tablet by mouth 2 (two) times daily., Disp: 14 tablet, Rfl: 0   Ascorbic Acid (VITAMIN C) 1000 MG tablet, Take 1,000 mg by mouth daily., Disp: , Rfl:    benzonatate (TESSALON) 100 MG capsule, Take 1 capsule (100 mg total) by mouth 3 (three) times daily as needed for cough., Disp: 20 capsule, Rfl: 0   Cholecalciferol (D3-1000 PO), Take 1 tablet by mouth daily., Disp: , Rfl:    loratadine (CLARITIN) 10 MG tablet, Take 10 mg by mouth daily., Disp: , Rfl:    metoprolol succinate (TOPROL-XL) 25 MG 24 hr tablet, TAKE 1/2 TABLETS (12.5 MG TOTAL) BY MOUTH DAILY., Disp: 45 tablet, Rfl: 3   triamcinolone cream (KENALOG) 0.1 %, Apply 1 application topically 2 (two) times daily., Disp: 60 g, Rfl: 1  Observations/Objective: Patient is well-developed, well-nourished in no acute distress.  Resting comfortably  at home.  Head is normocephalic, atraumatic.  No labored breathing.  Speech is clear and coherent with logical content.  Patient is alert and oriented at baseline.  No cough during visit  Assessment and Plan:  [name] in today with chief complaint of No chief complaint on file.   1. SOB (shortness of breath) Force fluids Explained that xopenex wont increase heart rate. Meds ordered this encounter  Medications   levalbuterol (XOPENEX HFA) 45 MCG/ACT inhaler    Sig: Inhale 2 puffs into the lungs every 4 (four) hours as needed for wheezing.    Dispense:  1 each    Refill:  12    Patient has paroxymal atrial fib and is afraid to use albuterol due to possible in crease in heart rate    Order Specific Question:   Supervising Provider    Answer:   Sabra Heck, BRIAN [[id number]]   fluticasone (FLONASE) 50 MCG/ACT nasal spray    Sig: Place 2 sprays into both nostrils daily.    Dispense:  16 g     Refill:  6    Order Specific Question:   Supervising Provider    Answer:   Noemi Chapel [[id number]]      Follow Up Instructions: I discussed the assessment and treatment plan with the patient. The patient was provided an opportunity to ask questions and all were answered. The patient agreed with the plan and demonstrated an understanding of the instructions.  A copy of instructions were sent to the patient via MyChart.  The patient was advised to call back or seek an in-person evaluation if the symptoms worsen or if the condition fails to improve as anticipated.  Time:  I spent 12 minutes with the patient via telehealth technology discussing the above problems/concerns.    Mary-Margaret Hassell Done, FNP

## 2021-09-12 NOTE — Patient Instructions (Signed)
Levalbuterol Metered Dose Inhaler (MDI) What is this medication? LEVALBUTEROL (lev al BYOO ter ol) treats lung diseases, such as asthma, where the airways in the lungs narrow, causing breathing problems or wheezing (bronchospasm). It works by opening the airways of the lungs, making it easier to breathe. It is often called a rescue or quick-relief inhaler. This medicine may be used for other purposes; ask your health care provider or pharmacist if you have questions. COMMON BRAND NAME(S): Xopenex, Xopenex HFA What should I tell my care team before I take this medication? They need to know if you have any of the following conditions: Heart disease High blood pressure High blood sugar (diabetes) Irregular heartbeat or rhythm Kidney disease Pheochromocytoma Seizures Thyroid disease An unusual or allergic reaction to albuterol, levalbuterol, other medications, foods, dyes, or preservatives Pregnant or trying to get pregnant Breast-feeding How should I use this medication? This medication is inhaled through the mouth. Take it as directed on the prescription label. Do not use it more often than directed. Keep taking it unless your care team tells you to stop. This medication comes with INSTRUCTIONS FOR USE. Ask your pharmacist for directions on how to use this medication. Read the information carefully. Talk to your pharmacist or care team if you have questions. Talk to your care team regarding the use of this medication in children. While this medication may be prescribed for children as young as 4 years for selected conditions, precautions do apply. Overdosage: If you think you have taken too much of this medicine contact a poison control center or emergency room at once. NOTE: This medicine is only for you. Do not share this medicine with others. What if I miss a dose? If you take this medication on a regular basis, take it as soon as you can. If it is almost time for your next dose, take only  that dose. Do not take double or extra doses. What may interact with this medication? Anti-infectives like chloroquine and pentamidine Caffeine Cisapride Diuretics Medications for colds Medications for depression or for emotional or psychotic conditions Medications for weight loss including some herbal products Methadone Some antibiotics like clarithromycin, erythromycin, levofloxacin, and linezolid Some heart medications Steroid hormones like dexamethasone, cortisone, hydrocortisone Theophylline Thyroid hormones This list may not describe all possible interactions. Give your health care provider a list of all the medicines, herbs, non-prescription drugs, or dietary supplements you use. Also tell them if you smoke, drink alcohol, or use illegal drugs. Some items may interact with your medicine. What should I watch for while using this medication? Visit your care team for regular checks on your progress. Tell your care team if your symptoms do not start to get better or if they get worse. If your symptoms get worse or if you are using this medication more than normal, call your care team right away. Do not treat yourself for coughs, colds or allergies without asking your care team for advice. Some nonprescription medications can affect this one. You and your care team should develop an Asthma Action Plan that is just for you. Be sure to know what to do if you are in the yellow (asthma is getting worse) or red (medical alert) zones. Your mouth may get dry. Chewing sugarless gum or sucking hard candy and drinking plenty of water may help. Contact your care team if the problem does not go away or is severe. What side effects may I notice from receiving this medication? Side effects that you should report to  your care team as soon as possible: Allergic reactions--skin rash, itching, hives, swelling of the face, lips, tongue, or throat Heart rhythm changes--fast or irregular heartbeat, dizziness,  feeling faint or lightheaded, chest pain, trouble breathing Increase in blood pressure Low potassium level--muscle pain or cramps, unusual weakness or fatigue, fast or irregular heartbeat Wheezing or trouble breathing that is worse after use Side effects that usually do not require medical attention (report to your care team if they continue or are bothersome): Dizziness Headache Tremors or shaking Trouble sleeping This list may not describe all possible side effects. Call your doctor for medical advice about side effects. You may report side effects to FDA at 1-800-FDA-1088. Where should I keep my medication? Keep out of the reach of children and pets. Store canister upright with the mouthpiece down. Store at room temperature between 20 and 25 degrees C (68 and 77 degrees F). Keep inhaler away from extreme heat, cold or humidity. Throw away when the dose counter reads "0" or after the expiration date, whichever is first. NOTE: This sheet is a summary. It may not cover all possible information. If you have questions about this medicine, talk to your doctor, pharmacist, or health care provider.  2022 Elsevier/Gold Standard (2021-07-01 00:00:00)

## 2021-09-22 ENCOUNTER — Encounter: Payer: Self-pay | Admitting: Nurse Practitioner

## 2021-09-25 ENCOUNTER — Other Ambulatory Visit: Payer: Self-pay | Admitting: Nurse Practitioner

## 2021-09-25 DIAGNOSIS — Z1231 Encounter for screening mammogram for malignant neoplasm of breast: Secondary | ICD-10-CM

## 2021-09-28 ENCOUNTER — Other Ambulatory Visit: Payer: Self-pay

## 2021-09-28 ENCOUNTER — Ambulatory Visit: Payer: Self-pay | Attending: Nurse Practitioner

## 2021-10-08 ENCOUNTER — Other Ambulatory Visit: Payer: Self-pay

## 2021-10-08 ENCOUNTER — Encounter: Payer: Self-pay | Admitting: Specialist

## 2021-10-08 ENCOUNTER — Ambulatory Visit (INDEPENDENT_AMBULATORY_CARE_PROVIDER_SITE_OTHER): Payer: Self-pay | Admitting: Specialist

## 2021-10-08 VITALS — BP 107/73 | HR 67 | Ht 68.0 in | Wt 168.0 lb

## 2021-10-08 DIAGNOSIS — M4814 Ankylosing hyperostosis [Forestier], thoracic region: Secondary | ICD-10-CM

## 2021-10-08 DIAGNOSIS — R296 Repeated falls: Secondary | ICD-10-CM

## 2021-10-08 DIAGNOSIS — M5412 Radiculopathy, cervical region: Secondary | ICD-10-CM

## 2021-10-08 MED ORDER — ALPRAZOLAM 0.25 MG PO TABS: ORAL_TABLET | ORAL | 0 refills | Status: DC

## 2021-10-08 NOTE — Patient Instructions (Signed)
Plan: Avoid overhead lifting and overhead use of the arms. Do not lift greater than 5 lbs. Adjust head rest in vehicle to prevent hyperextension if rear ended. Take extra precautions to avoid falling. MRI of the cervical spine.
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
## 2021-10-09 ENCOUNTER — Encounter: Payer: Self-pay | Admitting: Nurse Practitioner

## 2021-10-09 MED FILL — Metoprolol Succinate Tab ER 24HR 25 MG (Tartrate Equiv): ORAL | 90 days supply | Qty: 45 | Fill #2 | Status: AC

## 2021-10-12 ENCOUNTER — Telehealth: Payer: Self-pay | Admitting: Specialist

## 2021-10-12 ENCOUNTER — Other Ambulatory Visit: Payer: Self-pay

## 2021-10-12 NOTE — Telephone Encounter (Signed)
I called and spoke with patient, she was asking if Dr. Louanne Skye ordered a NCS for her right leg, I advised that he did not place an order for one, states that it is doing better now and so is her Left UE, however she was asking if he could rx her a month of Xanax for her anxiety, I asked him and he said no that he can only do it for her MRI, that if she needs that she should get it from her PCP or see a Therapist to get it. Patient said ok.

## 2021-10-12 NOTE — Telephone Encounter (Signed)
Pt called about concerns with her medication.   CB [phone]

## 2021-10-13 ENCOUNTER — Other Ambulatory Visit: Payer: Self-pay

## 2021-10-29 ENCOUNTER — Other Ambulatory Visit: Payer: Self-pay

## 2021-10-29 ENCOUNTER — Ambulatory Visit
Admission: RE | Admit: 2021-10-29 | Discharge: 2021-10-29 | Disposition: A | Discharge status: Home / Self Care | Payer: No Typology Code available for payment source | Source: Ambulatory Visit | Attending: Nurse Practitioner | Admitting: Nurse Practitioner

## 2021-10-29 DIAGNOSIS — Z1231 Encounter for screening mammogram for malignant neoplasm of breast: Secondary | ICD-10-CM

## 2021-11-04 ENCOUNTER — Telehealth: Payer: Self-pay | Admitting: Specialist

## 2021-11-04 NOTE — Telephone Encounter (Signed)
Pt called requesting a call back from [facility]. Pt states she is no longer having pains in her neck now its her legs. Pt states she never received a call about her MRI then she said she did receive a call from 2 different facilities and she want to go to MRI facility in [place] then she stated she is asking to have MRI on her leg instead of her neck. Please call pt to verify which body part she is asking to have MRI. Pt phone number is [phone].

## 2021-11-05 ENCOUNTER — Encounter: Payer: Self-pay | Admitting: Specialist

## 2021-11-06 NOTE — Telephone Encounter (Signed)
Sent her a Therapist, music, per Dr. Louanne Skye we did not see her for the lower back and leg pain, we only saw her for the neck, and so we do not have any documentation for her back. Per JN she needs to be seen for it before we can order it. She needs an appointment

## 2021-11-08 ENCOUNTER — Encounter (HOSPITAL_BASED_OUTPATIENT_CLINIC_OR_DEPARTMENT_OTHER): Payer: Self-pay | Admitting: Emergency Medicine

## 2021-11-08 ENCOUNTER — Emergency Department (HOSPITAL_BASED_OUTPATIENT_CLINIC_OR_DEPARTMENT_OTHER)
Admission: EM | Admit: 2021-11-08 | Discharge: 2021-11-08 | Disposition: A | Discharge status: Home / Self Care | Payer: Self-pay | Attending: Emergency Medicine | Admitting: Emergency Medicine

## 2021-11-08 ENCOUNTER — Other Ambulatory Visit: Payer: Self-pay

## 2021-11-08 ENCOUNTER — Emergency Department (HOSPITAL_BASED_OUTPATIENT_CLINIC_OR_DEPARTMENT_OTHER): Payer: Self-pay

## 2021-11-08 DIAGNOSIS — M545 Low back pain, unspecified: Secondary | ICD-10-CM | POA: Insufficient documentation

## 2021-11-08 DIAGNOSIS — R202 Paresthesia of skin: Secondary | ICD-10-CM | POA: Insufficient documentation

## 2021-11-08 DIAGNOSIS — M79604 Pain in right leg: Secondary | ICD-10-CM | POA: Insufficient documentation

## 2021-11-08 DIAGNOSIS — R200 Anesthesia of skin: Secondary | ICD-10-CM

## 2021-11-08 DIAGNOSIS — Z8616 Personal history of COVID-19: Secondary | ICD-10-CM | POA: Insufficient documentation

## 2021-11-08 LAB — CBG MONITORING, ED: Glucose-Capillary: 77 mg/dL (ref 70–99)

## 2021-11-08 NOTE — ED Notes (Signed)
Pt is worried she has DVT. Had covid in oct-December and worried as she knows that can occur with covid. No swelling noted

## 2021-11-08 NOTE — ED Triage Notes (Signed)
Pt presents with right lower leg tenderness,right foot feels "like its asleep". This  has been going on for 2 weeks. Pt has ortho appointment on Thursday. Pt states in past 3 days her thigh and left leg has been intermittenly painful.

## 2021-11-08 NOTE — ED Notes (Signed)
Ultrasound in room

## 2021-11-08 NOTE — Discharge Instructions (Signed)
Follow-up with your primary care doctor and your orthopedic specialist.  Come back to ER if you develop worsening numbness, weakness, uncontrolled pain or other new concerning symptom.

## 2021-11-08 NOTE — ED Provider Notes (Signed)
[facility] EMERGENCY DEPT Provider Note   CSN: [id number] Arrival date & time: 11/08/21  0845     History  Chief Complaint  Patient presents with   Leg Pain    [name] is a 61 y.o. female.  Presenting to the ER with concern for intermittent pain in her right leg as well as tingling sensation in her right foot.  Denies frank numbness but does have slight pins/needles sensation in her foot intermittently.  Also had some intermittent pain in her right leg.  Currently does not have any pain in the leg or pain in her back though she has also had intermittent pain in her low back.  Had seen orthopedic doctor couple weeks ago about neck pain but the neck pain has since resolved.  Has been able to walk without any difficulty.  Does endorse having COVID couple months ago and is worried about possibility of blood clot in her leg.  Has not noted any skin color changes or significant swelling in her leg.  HPI     Home Medications Prior to Admission medications   Medication Sig Start Date End Date Taking? Authorizing Provider  ALPRAZolam Duanne Moron) 0.25 MG tablet Take one at the MRI after signing , may repeat after 30 min if necessary 10/08/21   Jessy Oto, MD  amoxicillin-clavulanate (AUGMENTIN) 875-125 MG tablet Take 1 tablet by mouth 2 (two) times daily. 09/06/21   Waldon Merl, PA-C  Ascorbic Acid (VITAMIN C) 1000 MG tablet Take 1,000 mg by mouth daily.    [provider]  benzonatate (TESSALON) 100 MG capsule Take 1 capsule (100 mg total) by mouth 3 (three) times daily as needed for cough. 09/06/21   Waldon Merl, PA-C  Cholecalciferol (D3-1000 PO) Take 1 tablet by mouth daily.    [provider]  fluticasone (FLONASE) 50 MCG/ACT nasal spray Place 2 sprays into both nostrils daily. 09/12/21   Hassell Done Mary-Margaret, FNP  levalbuterol [facility] HFA) 45 MCG/ACT inhaler Inhale 2 puffs into the lungs every 4 (four) hours as needed for wheezing.  09/12/21   Hassell Done, Mary-Margaret, FNP  loratadine (CLARITIN) 10 MG tablet Take 10 mg by mouth daily.    [provider]  metoprolol succinate (TOPROL-XL) 25 MG 24 hr tablet TAKE 1/2 TABLETS (12.5 MG TOTAL) BY MOUTH DAILY. 01/06/21 01/11/22  [name], NP  triamcinolone cream (KENALOG) 0.1 % Apply 1 application topically 2 (two) times daily. 01/18/20   [name], NP      Allergies    Azithromycin, Sausage [pickled meat], and Sulfamethoxazole    Review of Systems   Review of Systems  Musculoskeletal:  Positive for arthralgias.  Neurological:  Positive for numbness.  All other systems reviewed and are negative.  Physical Exam Updated Vital Signs BP 139/82    Pulse 68    Temp 98 F (36.7 C)    Resp 20    LMP 12/05/2013    SpO2 100%  Physical Exam Vitals and nursing note reviewed.  Constitutional:      General: She is not in acute distress.    Appearance: She is well-developed.  HENT:     Head: Normocephalic and atraumatic.  Eyes:     Conjunctiva/sclera: Conjunctivae normal.  Cardiovascular:     Rate and Rhythm: Normal rate.     Pulses: Normal pulses.  Pulmonary:     Effort: Pulmonary effort is normal. No respiratory distress.  Abdominal:     Palpations: Abdomen is soft.  Tenderness: There is no abdominal tenderness.  Musculoskeletal:        General: No swelling.     Cervical back: Neck supple.     Comments: RLE: No tenderness to palpation throughout, extremity feels warm and well-perfused, normal DP/PT pulses, normal sensation to light touch and pinprick throughout, 5 out of 5 strength throughout LLE: No tenderness to palpation throughout, extremity feels warm and well-perfused, normal DP/PT pulses, normal sensation to light touch and pinprick throughout, 5 out of 5 strength throughout Back: no TTP throughout  Skin:    General: Skin is warm and dry.     Capillary Refill: Capillary refill takes less than 2 seconds.  Neurological:     Mental Status: She  is alert.  Psychiatric:        Mood and Affect: Mood normal.    ED Results / Procedures / Treatments   Labs (all labs ordered are listed, but only abnormal results are displayed) Labs Reviewed  CBG MONITORING, ED    EKG None  Radiology US Venous Img Lower Unilateral Right  Result Date: 11/08/2021 CLINICAL DATA:  RIGHT leg pain EXAM: RIGHT LOWER EXTREMITY VENOUS DOPPLER ULTRASOUND TECHNIQUE: Gray-scale sonography with compression, as well as color and duplex ultrasound, were performed to evaluate the deep venous system(s) from the level of the common femoral vein through the popliteal and proximal calf veins. COMPARISON:  CT AP, 06/25/2019. FINDINGS: VENOUS Normal compressibility of the RIGHT common femoral, superficial femoral, and popliteal veins, as well as the visualized calf veins. Visualized portions of profunda femoral vein and great saphenous vein unremarkable. No filling defects to suggest DVT on grayscale or color Doppler imaging. Doppler waveforms show normal direction of venous flow, normal respiratory plasticity and response to augmentation. Limited views of the contralateral common femoral vein are unremarkable. OTHER No evidence of superficial thrombophlebitis or abnormal fluid collection. Limitations: none IMPRESSION: No evidence of femoropopliteal DVT within the RIGHT lower extremity. Electronically Signed   By: Michaelle Birks M.D.   On: 11/08/2021 10:07    Procedures Procedures    Medications Ordered in ED Medications - No data to display  ED Course/ Medical Decision Making/ A&P                           Medical Decision Making   61 year old lady presenting to ER with concern for tingling sensation in her right foot as well as intermittent pain in low back and right leg.  Currently patient does not have any pain in back or leg.  Is endorsing some intermittent ongoing tingling/numb sensation in foot.  Patient had normal strength and sensation to both light touch and  pinprick throughout both of her extremities including feet on my exam.  Therefore based on this exam believe stroke, spinal stenosis causing cord compression or cauda equina highly unlikely and do not feel patient needs any MRI imaging of brain or spine today.  DVT study was negative.  She has normal DP and PT pulses and foot feels warm and well-perfused therefore doubt PAD and do not feel vascular study of arterial system is required today.  Glucose normal, doubt patient is diabetic, does not have prior history.  Given her current exam and well appearance, believe that she can be discharged and managed in the outpatient setting, recommend she follow-up with her orthopedist as well as her primary care doctor, reviewed return precautions should her symptoms significantly worsen, will discharge home with friend at bedside.  Updated patient and friend throughout visit.  Additional history was obtained from chart review, review of recent Ortho notes and telephone notes.       Final Clinical Impression(s) / ED Diagnoses Final diagnoses:  Right leg pain  Numbness and tingling of foot    Rx / DC Orders ED Discharge Orders     None         Lucrezia Starch, MD 11/08/21 1058

## 2021-11-12 ENCOUNTER — Ambulatory Visit: Payer: Self-pay | Admitting: Surgery

## 2021-11-23 ENCOUNTER — Encounter: Payer: Self-pay | Admitting: Nurse Practitioner

## 2021-11-23 ENCOUNTER — Ambulatory Visit: Payer: Self-pay | Attending: Nurse Practitioner | Admitting: Nurse Practitioner

## 2021-11-23 ENCOUNTER — Other Ambulatory Visit: Payer: Self-pay

## 2021-11-23 VITALS — BP 113/70 | HR 64 | Ht 68.0 in | Wt 167.0 lb

## 2021-11-23 DIAGNOSIS — Z0000 Encounter for general adult medical examination without abnormal findings: Secondary | ICD-10-CM

## 2021-11-23 DIAGNOSIS — Z8679 Personal history of other diseases of the circulatory system: Secondary | ICD-10-CM

## 2021-11-23 DIAGNOSIS — Z780 Asymptomatic menopausal state: Secondary | ICD-10-CM

## 2021-11-23 DIAGNOSIS — E785 Hyperlipidemia, unspecified: Secondary | ICD-10-CM

## 2021-11-23 DIAGNOSIS — B001 Herpesviral vesicular dermatitis: Secondary | ICD-10-CM

## 2021-11-23 DIAGNOSIS — Z131 Encounter for screening for diabetes mellitus: Secondary | ICD-10-CM

## 2021-11-23 DIAGNOSIS — R0982 Postnasal drip: Secondary | ICD-10-CM

## 2021-11-23 MED ORDER — VALACYCLOVIR HCL 1 G PO TABS
2000.0000 mg | ORAL_TABLET | Freq: Two times a day (BID) | ORAL | 1 refills | Status: AC
  Filled 2021-11-23: qty 4, 1d supply, fill #0

## 2021-11-23 MED ORDER — FLUTICASONE PROPIONATE 50 MCG/ACT NA SUSP
2.0000 | Freq: Every day | NASAL | 1 refills | Status: DC
  Filled 2021-11-23: qty 16, 30d supply, fill #0
  Filled 2022-04-08: qty 16, 30d supply, fill #1

## 2021-11-23 MED ORDER — METOPROLOL SUCCINATE ER 25 MG PO TB24
12.5000 mg | ORAL_TABLET | Freq: Every day | ORAL | 6 refills | Status: DC
  Filled 2021-11-23: qty 45, 90d supply, fill #0
  Filled 2021-11-24: qty 15, 30d supply, fill #0
  Filled 2022-01-24: qty 45, 90d supply, fill #1
  Filled 2022-04-13 – 2022-04-22 (×2): qty 45, 90d supply, fill #2
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
## 2021-11-24 ENCOUNTER — Other Ambulatory Visit: Payer: Self-pay

## 2021-11-24 ENCOUNTER — Encounter: Payer: Self-pay | Admitting: Nurse Practitioner

## 2021-11-24 LAB — CMP14+EGFR
ALT: 21 IU/L (ref 0–32)
AST: 23 IU/L (ref 0–40)
Albumin/Globulin Ratio: 1.6 (ref 1.2–2.2)
Albumin: 4.5 g/dL (ref 3.8–4.9)
Alkaline Phosphatase: 85 IU/L (ref 44–121)
BUN/Creatinine Ratio: 20 (ref 9–23)
BUN: 14 mg/dL (ref 6–24)
Bilirubin Total: 0.5 mg/dL (ref 0.0–1.2)
CO2: 25 mmol/L (ref 20–29)
Calcium: 9.5 mg/dL (ref 8.7–10.2)
Chloride: 106 mmol/L (ref 96–106)
Creatinine, Ser: 0.7 mg/dL (ref 0.57–1.00)
Globulin, Total: 2.8 g/dL (ref 1.5–4.5)
Glucose: 84 mg/dL (ref 70–99)
Potassium: 4.5 mmol/L (ref 3.5–5.2)
Sodium: 145 mmol/L — ABNORMAL HIGH (ref 134–144)
Total Protein: 7.3 g/dL (ref 6.0–8.5)
eGFR: 100 mL/min/{1.73_m2} (ref >59)

## 2021-11-24 LAB — LIPID PANEL
Chol/HDL Ratio: 2.7 ratio (ref 0.0–4.4)
Cholesterol, Total: 202 mg/dL — ABNORMAL HIGH (ref 100–199)
HDL: 74 mg/dL (ref >39)
LDL Chol Calc (NIH): 115 mg/dL — ABNORMAL HIGH (ref 0–99)
Triglycerides: 73 mg/dL (ref 0–149)
VLDL Cholesterol Cal: 13 mg/dL (ref 5–40)

## 2021-11-24 LAB — HEMOGLOBIN A1C
Est. average glucose Bld gHb Est-mCnc: 111 mg/dL
Hgb A1c MFr Bld: 5.5 % (ref 4.8–5.6)

## 2021-12-01 ENCOUNTER — Telehealth: Payer: Self-pay | Admitting: Internal Medicine

## 2021-12-01 NOTE — Telephone Encounter (Signed)
Pt c/o BP issue: STAT if pt c/o blurred vision, one-sided weakness or slurred speech  1. What are your last 5 BP readings? 148/90; 78; 117/78; 59; 106/68; 63  2. Are you having any other symptoms (ex. Dizziness, headache, blurred vision, passed out)? Head pressure on left side, left ear is swollen and is red and hot, left cheek is red and warm to the touch like a sunburn  3. What is your BP issue? Higher than normal BP; wants to know if anyone could see her sooner
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
## 2021-12-02 NOTE — Telephone Encounter (Signed)
Returned call to pt she states that she is better and the redness is going away and she is "tingling" in the area as well. She will continue to "keep an eye" on this and her BP. She states that she is fine now but is still concerned about this redness and tingling behind her ear. She will continue Metoprolol and she will call back if she needs anything.

## 2021-12-02 NOTE — Telephone Encounter (Signed)
This morning bp was 129/81.Marland Kitchen its normally 106/65 left cheek and ear were red and warm again as well... please advise.

## 2021-12-04 ENCOUNTER — Other Ambulatory Visit: Payer: Self-pay

## 2021-12-04 ENCOUNTER — Ambulatory Visit: Payer: Self-pay | Admitting: Specialist

## 2021-12-07 ENCOUNTER — Telehealth: Payer: Self-pay | Admitting: Nurse Practitioner

## 2021-12-07 NOTE — Telephone Encounter (Signed)
Referral Request - Has patient seen PCP for this complaint? yes *If NO, is insurance requiring patient see PCP for this issue before PCP can refer them? Referral for which specialty:ENT Preferred provider/office:N/A Reason for referral: ear pain and swelling still after seeing Dr

## 2021-12-10 ENCOUNTER — Other Ambulatory Visit: Payer: Self-pay | Admitting: Nurse Practitioner

## 2021-12-10 DIAGNOSIS — H9203 Otalgia, bilateral: Secondary | ICD-10-CM

## 2021-12-10 NOTE — Telephone Encounter (Signed)
Please let her know from my understanding ENT is not covered by our inhouse insurance. She may have to pay out of pocket just fyi. Referral has been placed.

## 2021-12-14 NOTE — Telephone Encounter (Signed)
Called and informed pt of information.

## 2021-12-15 ENCOUNTER — Ambulatory Visit: Payer: Self-pay | Admitting: *Deleted
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
## 2021-12-17 ENCOUNTER — Other Ambulatory Visit: Payer: Self-pay

## 2021-12-17 ENCOUNTER — Encounter: Payer: Self-pay | Admitting: Physician Assistant

## 2021-12-17 ENCOUNTER — Ambulatory Visit (INDEPENDENT_AMBULATORY_CARE_PROVIDER_SITE_OTHER): Payer: Self-pay | Admitting: Physician Assistant

## 2021-12-17 VITALS — BP 114/76 | HR 75 | Temp 98.2°F | Resp 18 | Ht 68.0 in | Wt 164.0 lb

## 2021-12-17 DIAGNOSIS — F411 Generalized anxiety disorder: Secondary | ICD-10-CM

## 2021-12-17 DIAGNOSIS — R4589 Other symptoms and signs involving emotional state: Secondary | ICD-10-CM

## 2021-12-17 MED ORDER — HYDROXYZINE HCL 10 MG PO TABS
10.0000 mg | ORAL_TABLET | Freq: Three times a day (TID) | ORAL | 1 refills | Status: DC | PRN
  Filled 2021-12-17: qty 60, 20d supply, fill #0

## 2021-12-17 NOTE — Patient Instructions (Signed)
You are going to start taking hydroxyzine 10 mg every 8 hours as needed.  I do encourage you to take it 3 times a day for the next few days and then started as needed.  I have started a referral for you to be seen by our clinic counselor.  Please let us know if there is anything else we can do for you.  Kennieth Rad, PA-C Physician Assistant [facility] [email or web address]   Managing Anxiety, Adult After being diagnosed with anxiety, you may be relieved to know why you have felt or behaved a certain way. You may also feel overwhelmed about the treatment ahead and what it will mean for your life. With care and support, you can manage this condition. How to manage lifestyle changes Managing stress and anxiety Stress is your body's reaction to life changes and events, both good and bad. Most stress will last just a few hours, but stress can be ongoing and can lead to more than just stress. Although stress can play a major role in anxiety, it is not the same as anxiety. Stress is usually caused by something external, such as a deadline, test, or competition. Stress normally passes after the triggering event has ended.  Anxiety is caused by something internal, such as imagining a terrible outcome or worrying that something will go wrong that will devastate you. Anxiety often does not go away even after the triggering event is over, and it can become long-term (chronic) worry. It is important to understand the differences between stress and anxiety and to manage your stress effectively so that it does not lead to an anxious response. Talk with your health care provider or a counselor to learn more about reducing anxiety and stress. He or she may suggest tension reduction techniques, such as: Music therapy. Spend time creating or listening to music that you enjoy and that inspires you. Mindfulness-based meditation. Practice being aware of your  normal breaths while not trying to control your breathing. It can be done while sitting or walking. Centering prayer. This involves focusing on a word, phrase, or sacred image that means something to you and brings you peace. Deep breathing. To do this, expand your stomach and inhale slowly through your nose. Hold your breath for 3-5 seconds. Then exhale slowly, letting your stomach muscles relax. Self-talk. Learn to notice and identify thought patterns that lead to anxiety reactions and change those patterns to thoughts that feel peaceful. Muscle relaxation. Taking time to tense muscles and then relax them. Choose a tension reduction technique that fits your lifestyle and personality. These techniques take time and practice. Set aside 5-15 minutes a day to do them. Therapists can offer counseling and training in these techniques. The training to help with anxiety may be covered by some insurance plans. Other things you can do to manage stress and anxiety include: Keeping a stress diary. This can help you learn what triggers your reaction and then learn ways to manage your response. Thinking about how you react to certain situations. You may not be able to control everything, but you can control your response. Making time for activities that help you relax and not feeling guilty about spending your time in this way. Doing visual imagery. This involves imagining or creating mental pictures to help you relax. Practicing yoga. Through yoga poses, you can lower tension and promote relaxation.  Medicines Medicines can help ease symptoms. Medicines for anxiety include: Antidepressant medicines. These are usually prescribed for long-term daily  control. Anti-anxiety medicines. These may be added in severe cases, especially when panic attacks occur. Medicines will be prescribed by a health care provider. When used together, medicines, psychotherapy, and tension reduction techniques may be the most effective  treatment. Relationships Relationships can play a big part in helping you recover. Try to spend more time connecting with trusted friends and family members. Consider going to couples counseling if you have a partner, taking family education classes, or going to family therapy. Therapy can help you and others better understand your condition. How to recognize changes in your anxiety Everyone responds differently to treatment for anxiety. Recovery from anxiety happens when symptoms decrease and stop interfering with your daily activities at home or work. This may mean that you will start to: Have better concentration and focus. Worry will interfere less in your daily thinking. Sleep better. Be less irritable. Have more energy. Have improved memory. It is also important to recognize when your condition is getting worse. Contact your health care provider if your symptoms interfere with home or work and you feel like your condition is not improving. Follow these instructions at home: Activity Exercise. Adults should do the following: Exercise for at least 150 minutes each week. The exercise should increase your heart rate and make you sweat (moderate-intensity exercise). Strengthening exercises at least twice a week. Get the right amount and quality of sleep. Most adults need 7-9 hours of sleep each night. Lifestyle  Eat a healthy diet that includes plenty of vegetables, fruits, whole grains, low-fat dairy products, and lean protein. Do not eat a lot of foods that are high in fats, added sugars, or salt (sodium). Make choices that simplify your life. Do not use any products that contain nicotine or tobacco. These products include cigarettes, chewing tobacco, and vaping devices, such as e-cigarettes. If you need help quitting, ask your health care provider. Avoid caffeine, alcohol, and certain over-the-counter cold medicines. These may make you feel worse. Ask your pharmacist which medicines to  avoid. General instructions Take over-the-counter and prescription medicines only as told by your health care provider. Keep all follow-up visits. This is important. Where to find support You can get help and support from these sources: Self-help groups. Online and [employer]. A trusted spiritual leader. Couples counseling. Family education classes. Family therapy. Where to find more information You may find that joining a support group helps you deal with your anxiety. The following sources can help you locate counselors or support groups near you: [place]: www.mentalhealthamerica.net Anxiety and Depression Association of [place] (ADAA): https://www.clark.net/ National Alliance on Mental Illness (NAMI): www.nami.org Contact a health care provider if: You have a hard time staying focused or finishing daily tasks. You spend many hours a day feeling worried about everyday life. You become exhausted by worry. You start to have headaches or frequently feel tense. You develop chronic nausea or diarrhea. Get help right away if: You have a racing heart and shortness of breath. You have thoughts of hurting yourself or others. If you ever feel like you may hurt yourself or others, or have thoughts about taking your own life, get help right away. Go to your nearest emergency department or: Call your local emergency services (911 in the [place] Call a suicide crisis helpline, such as the [place] at 346-836-0731 or 988 in the [place]. This is open 24 hours a day in the [place] Text the Crisis Text Line at (737)842-0541 (in the [place].). Summary Taking steps to learn  and use tension reduction techniques can help calm you and help prevent triggering an anxiety reaction. When used together, medicines, psychotherapy, and tension reduction techniques may be the most effective treatment. Family, friends, and partners can play a big part in supporting you. This  information is not intended to replace advice given to you by your health care provider. Make sure you discuss any questions you have with your health care provider. Document Revised: 05/06/2021 Document Reviewed: 02/01/2021 Elsevier Patient Education  [place].
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
## 2021-12-22 ENCOUNTER — Telehealth: Payer: Self-pay | Admitting: Nurse Practitioner

## 2022-01-06 ENCOUNTER — Encounter: Payer: Self-pay | Admitting: Physician Assistant

## 2022-01-06 ENCOUNTER — Other Ambulatory Visit: Payer: Self-pay

## 2022-01-06 ENCOUNTER — Ambulatory Visit: Payer: Self-pay

## 2022-01-06 ENCOUNTER — Telehealth: Payer: Self-pay | Admitting: Nurse Practitioner

## 2022-01-06 ENCOUNTER — Ambulatory Visit: Payer: Self-pay | Attending: Physician Assistant | Admitting: Physician Assistant

## 2022-01-06 VITALS — BP 125/80 | HR 58 | Resp 16 | Wt 167.0 lb

## 2022-01-06 DIAGNOSIS — H6012 Cellulitis of left external ear: Secondary | ICD-10-CM

## 2022-01-06 MED ORDER — FLUCONAZOLE 150 MG PO TABS
150.0000 mg | ORAL_TABLET | Freq: Once | ORAL | 0 refills | Status: AC
  Filled 2022-01-06: qty 1, 1d supply, fill #0

## 2022-01-06 MED ORDER — CEPHALEXIN 500 MG PO CAPS
500.0000 mg | ORAL_CAPSULE | Freq: Three times a day (TID) | ORAL | 0 refills | Status: DC
  Filled 2022-01-06: qty 30, 10d supply, fill #0
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
## 2022-01-06 NOTE — Telephone Encounter (Signed)
Copied from [place] [phone]. Topic: Referral - Request for Referral >> Jan 06, 2022 12:38 PM Erick Blinks wrote: Has patient seen PCP for this complaint? Yes.   *If NO, is insurance requiring patient see PCP for this issue before PCP can refer them? Referral for which specialty: ENT  Preferred provider/office: "Whoever Alinda Sierras sent me to the first time is who I want to go to" Reason for referral: Ear pain and swelling
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
## 2022-01-06 NOTE — Patient Instructions (Signed)

## 2022-01-07 ENCOUNTER — Encounter: Payer: Self-pay | Admitting: Specialist

## 2022-01-07 ENCOUNTER — Other Ambulatory Visit: Payer: Self-pay

## 2022-01-07 ENCOUNTER — Ambulatory Visit (INDEPENDENT_AMBULATORY_CARE_PROVIDER_SITE_OTHER): Payer: Self-pay | Admitting: Specialist

## 2022-01-07 ENCOUNTER — Telehealth: Payer: Self-pay

## 2022-01-07 VITALS — BP 93/61 | HR 71 | Ht 68.0 in | Wt 164.0 lb

## 2022-01-07 DIAGNOSIS — M47814 Spondylosis without myelopathy or radiculopathy, thoracic region: Secondary | ICD-10-CM

## 2022-01-07 DIAGNOSIS — M7918 Myalgia, other site: Secondary | ICD-10-CM

## 2022-01-07 DIAGNOSIS — R519 Headache, unspecified: Secondary | ICD-10-CM

## 2022-01-07 NOTE — Progress Notes (Signed)
Left parascapular and SSA pain  ?Exam with trigger area and spasm. ?Epic changed at initiation of exam will complete later. ?

## 2022-01-07 NOTE — Patient Instructions (Signed)
Avoid overhead lifting and overhead use of the arms. ?Do not lift greater than 5 lbs. ?Adjust head rest in vehicle to prevent hyperextension if rear ended. ?Take extra precautions to avoid falling. ?Avoid overhead lifting and overhead use of the arms. ?Pillows to keep from sleeping directly on the shoulders ?Limited lifting to less than 10 lbs. ?Ice or heat for relief. ?Tylenol, be careful not to use in excess as they place burdens on the kidney. ?Stretching exercise help and strengthening is helpful to build endurance. ?[facility] for dry needling and cervical exercises.  ?

## 2022-01-07 NOTE — Telephone Encounter (Signed)
Called and gave pt the information where the referral was sent and she said she would give them a call. ?

## 2022-01-08 ENCOUNTER — Ambulatory Visit: Payer: Self-pay
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
## 2022-01-14 ENCOUNTER — Other Ambulatory Visit: Payer: Self-pay

## 2022-01-14 ENCOUNTER — Ambulatory Visit: Payer: Self-pay | Attending: Specialist

## 2022-01-14 DIAGNOSIS — M546 Pain in thoracic spine: Secondary | ICD-10-CM | POA: Insufficient documentation

## 2022-01-14 DIAGNOSIS — M7918 Myalgia, other site: Secondary | ICD-10-CM | POA: Insufficient documentation

## 2022-01-14 DIAGNOSIS — M542 Cervicalgia: Secondary | ICD-10-CM | POA: Insufficient documentation

## 2022-01-14 DIAGNOSIS — M47814 Spondylosis without myelopathy or radiculopathy, thoracic region: Secondary | ICD-10-CM | POA: Insufficient documentation

## 2022-01-14 DIAGNOSIS — R252 Cramp and spasm: Secondary | ICD-10-CM | POA: Insufficient documentation

## 2022-01-18 DIAGNOSIS — H6092 Unspecified otitis externa, left ear: Secondary | ICD-10-CM | POA: Insufficient documentation
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
## 2022-01-19 ENCOUNTER — Ambulatory Visit: Payer: Self-pay

## 2022-01-19 ENCOUNTER — Other Ambulatory Visit: Payer: Self-pay

## 2022-01-19 DIAGNOSIS — M542 Cervicalgia: Secondary | ICD-10-CM

## 2022-01-19 DIAGNOSIS — R252 Cramp and spasm: Secondary | ICD-10-CM

## 2022-01-19 DIAGNOSIS — M546 Pain in thoracic spine: Secondary | ICD-10-CM

## 2022-01-22 ENCOUNTER — Encounter: Payer: Self-pay | Admitting: Internal Medicine

## 2022-01-22 ENCOUNTER — Ambulatory Visit (INDEPENDENT_AMBULATORY_CARE_PROVIDER_SITE_OTHER): Payer: No Typology Code available for payment source | Admitting: Internal Medicine

## 2022-01-22 VITALS — BP 108/59 | HR 65 | Ht 68.0 in | Wt 169.2 lb

## 2022-01-22 DIAGNOSIS — I77810 Thoracic aortic ectasia: Secondary | ICD-10-CM

## 2022-01-22 DIAGNOSIS — I351 Nonrheumatic aortic (valve) insufficiency: Secondary | ICD-10-CM

## 2022-01-22 DIAGNOSIS — I480 Paroxysmal atrial fibrillation: Secondary | ICD-10-CM

## 2022-01-22 DIAGNOSIS — Z13220 Encounter for screening for lipoid disorders: Secondary | ICD-10-CM
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
## 2022-01-22 NOTE — Patient Instructions (Signed)
Medication Instructions:  ?Your physician recommends that you continue on your current medications as directed. Please refer to the Current Medication list given to you today. ? ?*If you need a refill on your cardiac medications before your next appointment, please call your pharmacy* ? ? ?Lab Work: ?FASTING lab work to check cholesterol  ? ?If you have labs (blood work) drawn today and your tests are completely normal, you will receive your results only by: ?MyChart Message (if you have MyChart) OR ?A paper copy in the mail ?If you have any lab test that is abnormal or we need to change your treatment, we will call you to review the results. ? ? ?Testing/Procedures: ?NONE ? ? ?Follow-Up: ?At [facility], you and your health needs are our priority.  As part of our continuing mission to provide you with exceptional heart care, we have created designated Provider Care Teams.  These Care Teams include your primary Cardiologist (physician) and Advanced Practice Providers (APPs -  Physician Assistants and Nurse Practitioners) who all work together to provide you with the care you need, when you need it. ? ?We recommend signing up for the patient portal called "MyChart".  Sign up information is provided on this After Visit Summary.  MyChart is used to connect with patients for Virtual Visits (Telemedicine).  Patients are able to view lab/test results, encounter notes, upcoming appointments, etc.  Non-urgent messages can be sent to your provider as well.   ?To learn more about what you can do with MyChart, go to [email or web address].   ? ?Your next appointment:   ?12 month(s) ? ?The format for your next appointment:   ?In Person ? ?Provider:   ?Pixie Casino, MD { ? ? ?

## 2022-01-25 ENCOUNTER — Ambulatory Visit: Payer: No Typology Code available for payment source | Attending: Specialist | Admitting: Physical Therapy

## 2022-01-25 ENCOUNTER — Encounter: Payer: Self-pay | Admitting: Physical Therapy

## 2022-01-25 ENCOUNTER — Other Ambulatory Visit: Payer: Self-pay

## 2022-01-25 DIAGNOSIS — M542 Cervicalgia: Secondary | ICD-10-CM

## 2022-01-25 DIAGNOSIS — M546 Pain in thoracic spine: Secondary | ICD-10-CM

## 2022-01-25 DIAGNOSIS — R252 Cramp and spasm: Secondary | ICD-10-CM
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
## 2022-01-27 ENCOUNTER — Other Ambulatory Visit: Payer: Self-pay

## 2022-01-28 ENCOUNTER — Ambulatory Visit: Payer: Self-pay

## 2022-01-28 DIAGNOSIS — M542 Cervicalgia: Secondary | ICD-10-CM

## 2022-01-28 DIAGNOSIS — M546 Pain in thoracic spine: Secondary | ICD-10-CM

## 2022-01-28 DIAGNOSIS — R252 Cramp and spasm: Secondary | ICD-10-CM
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
## 2022-02-02 ENCOUNTER — Ambulatory Visit: Payer: No Typology Code available for payment source

## 2022-02-02 DIAGNOSIS — R252 Cramp and spasm: Secondary | ICD-10-CM

## 2022-02-02 DIAGNOSIS — M546 Pain in thoracic spine: Secondary | ICD-10-CM

## 2022-02-02 DIAGNOSIS — M542 Cervicalgia: Secondary | ICD-10-CM
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
## 2022-02-04 ENCOUNTER — Ambulatory Visit: Payer: Self-pay | Admitting: Physical Therapy

## 2022-02-04 DIAGNOSIS — M546 Pain in thoracic spine: Secondary | ICD-10-CM

## 2022-02-04 DIAGNOSIS — R252 Cramp and spasm: Secondary | ICD-10-CM

## 2022-02-04 DIAGNOSIS — M542 Cervicalgia: Secondary | ICD-10-CM
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
## 2022-02-08 ENCOUNTER — Ambulatory Visit: Payer: No Typology Code available for payment source

## 2022-02-08 DIAGNOSIS — R252 Cramp and spasm: Secondary | ICD-10-CM

## 2022-02-08 DIAGNOSIS — M546 Pain in thoracic spine: Secondary | ICD-10-CM

## 2022-02-08 DIAGNOSIS — M542 Cervicalgia: Secondary | ICD-10-CM
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
## 2022-02-10 ENCOUNTER — Ambulatory Visit: Payer: Self-pay

## 2022-02-10 DIAGNOSIS — M542 Cervicalgia: Secondary | ICD-10-CM

## 2022-02-10 DIAGNOSIS — R252 Cramp and spasm: Secondary | ICD-10-CM

## 2022-02-10 DIAGNOSIS — M546 Pain in thoracic spine: Secondary | ICD-10-CM
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
## 2022-02-16 ENCOUNTER — Ambulatory Visit: Payer: Self-pay

## 2022-02-16 DIAGNOSIS — M542 Cervicalgia: Secondary | ICD-10-CM

## 2022-02-16 DIAGNOSIS — M546 Pain in thoracic spine: Secondary | ICD-10-CM

## 2022-02-16 DIAGNOSIS — R252 Cramp and spasm: Secondary | ICD-10-CM
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
## 2022-02-18 ENCOUNTER — Ambulatory Visit: Payer: No Typology Code available for payment source

## 2022-02-18 DIAGNOSIS — M546 Pain in thoracic spine: Secondary | ICD-10-CM

## 2022-02-18 DIAGNOSIS — M542 Cervicalgia: Secondary | ICD-10-CM

## 2022-02-18 DIAGNOSIS — R252 Cramp and spasm: Secondary | ICD-10-CM
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
## 2022-02-23 ENCOUNTER — Ambulatory Visit: Payer: No Typology Code available for payment source | Attending: Specialist

## 2022-02-23 DIAGNOSIS — M546 Pain in thoracic spine: Secondary | ICD-10-CM | POA: Insufficient documentation

## 2022-02-23 DIAGNOSIS — R252 Cramp and spasm: Secondary | ICD-10-CM | POA: Insufficient documentation

## 2022-02-23 DIAGNOSIS — M542 Cervicalgia: Secondary | ICD-10-CM | POA: Insufficient documentation
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
## 2022-02-25 ENCOUNTER — Ambulatory Visit: Payer: Self-pay

## 2022-02-25 DIAGNOSIS — R252 Cramp and spasm: Secondary | ICD-10-CM

## 2022-02-25 DIAGNOSIS — M542 Cervicalgia: Secondary | ICD-10-CM

## 2022-02-25 DIAGNOSIS — M546 Pain in thoracic spine: Secondary | ICD-10-CM
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
## 2022-03-02 ENCOUNTER — Ambulatory Visit: Payer: No Typology Code available for payment source

## 2022-03-04 ENCOUNTER — Ambulatory Visit (INDEPENDENT_AMBULATORY_CARE_PROVIDER_SITE_OTHER): Payer: Self-pay | Admitting: Specialist

## 2022-03-04 ENCOUNTER — Encounter: Payer: Self-pay | Admitting: Specialist

## 2022-03-04 VITALS — BP 110/73 | HR 65 | Ht 68.0 in | Wt 170.0 lb

## 2022-03-04 DIAGNOSIS — R2232 Localized swelling, mass and lump, left upper limb: Secondary | ICD-10-CM

## 2022-03-04 DIAGNOSIS — M5412 Radiculopathy, cervical region: Secondary | ICD-10-CM

## 2022-03-04 DIAGNOSIS — M47814 Spondylosis without myelopathy or radiculopathy, thoracic region: Secondary | ICD-10-CM

## 2022-03-04 DIAGNOSIS — M4814 Ankylosing hyperostosis [Forestier], thoracic region: Secondary | ICD-10-CM

## 2022-03-04 DIAGNOSIS — M6740 Ganglion, unspecified site: Secondary | ICD-10-CM

## 2022-03-04 DIAGNOSIS — M25531 Pain in right wrist: Secondary | ICD-10-CM
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
## 2022-03-11 ENCOUNTER — Encounter: Payer: Self-pay | Admitting: Nurse Practitioner

## 2022-03-12 ENCOUNTER — Ambulatory Visit (INDEPENDENT_AMBULATORY_CARE_PROVIDER_SITE_OTHER): Payer: No Typology Code available for payment source | Admitting: Orthopaedic Surgery

## 2022-03-12 ENCOUNTER — Ambulatory Visit (INDEPENDENT_AMBULATORY_CARE_PROVIDER_SITE_OTHER): Payer: Self-pay

## 2022-03-12 ENCOUNTER — Other Ambulatory Visit: Payer: Self-pay | Admitting: Nurse Practitioner

## 2022-03-12 DIAGNOSIS — M79645 Pain in left finger(s): Secondary | ICD-10-CM

## 2022-03-12 DIAGNOSIS — L989 Disorder of the skin and subcutaneous tissue, unspecified: Secondary | ICD-10-CM
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
## 2022-03-17 ENCOUNTER — Other Ambulatory Visit: Payer: Self-pay

## 2022-03-17 ENCOUNTER — Other Ambulatory Visit: Payer: Self-pay | Admitting: Physician Assistant

## 2022-03-17 MED ORDER — FLUOROURACIL 5 % EX CREA
TOPICAL_CREAM | CUTANEOUS | 1 refills | Status: DC
  Filled 2022-03-17: qty 40, 30d supply, fill #0

## 2022-03-18 ENCOUNTER — Other Ambulatory Visit: Payer: Self-pay

## 2022-03-23 ENCOUNTER — Encounter: Payer: Self-pay | Admitting: Nurse Practitioner

## 2022-04-08 ENCOUNTER — Other Ambulatory Visit: Payer: Self-pay

## 2022-04-08 LAB — NMR, LIPOPROFILE
Cholesterol, Total: 199 mg/dL (ref 100–199)
HDL Particle Number: 35.5 umol/L (ref >=30.5)
HDL-C: 72 mg/dL (ref >39)
LDL Particle Number: 1171 nmol/L — ABNORMAL HIGH (ref <1000)
LDL Size: 21.6 nm (ref >20.5)
LDL-C (NIH Calc): 112 mg/dL — ABNORMAL HIGH (ref 0–99)
LP-IR Score: <25 (ref <=45)
Small LDL Particle Number: 273 nmol/L (ref <=527)
Triglycerides: 85 mg/dL (ref 0–149)

## 2022-04-13 ENCOUNTER — Other Ambulatory Visit: Payer: Self-pay

## 2022-04-14 ENCOUNTER — Other Ambulatory Visit: Payer: Self-pay

## 2022-04-22 ENCOUNTER — Other Ambulatory Visit: Payer: Self-pay

## 2022-04-23 ENCOUNTER — Other Ambulatory Visit: Payer: Self-pay

## 2022-05-12 ENCOUNTER — Ambulatory Visit: Payer: Self-pay | Attending: Nurse Practitioner | Admitting: Nurse Practitioner

## 2022-05-12 ENCOUNTER — Encounter: Payer: Self-pay | Admitting: Nurse Practitioner

## 2022-05-12 ENCOUNTER — Other Ambulatory Visit: Payer: Self-pay

## 2022-05-12 VITALS — BP 114/75 | HR 73 | Wt 170.2 lb

## 2022-05-12 DIAGNOSIS — E870 Hyperosmolality and hypernatremia: Secondary | ICD-10-CM

## 2022-05-12 DIAGNOSIS — Z8679 Personal history of other diseases of the circulatory system: Secondary | ICD-10-CM

## 2022-05-12 MED ORDER — METOPROLOL SUCCINATE ER 25 MG PO TB24
12.5000 mg | ORAL_TABLET | Freq: Every day | ORAL | 3 refills | Status: DC
  Filled 2022-05-12 – 2022-07-21 (×2): qty 45, 90d supply, fill #0
  Filled 2022-10-17: qty 45, 90d supply, fill #1
  Filled 2023-01-02 – 2023-01-03 (×2): qty 45, 90d supply, fill #2
  Filled 2023-04-14: qty 45, 90d supply, fill #3
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
## 2022-05-13 ENCOUNTER — Ambulatory Visit: Payer: Self-pay | Attending: Nurse Practitioner

## 2022-05-14 LAB — BASIC METABOLIC PANEL
BUN/Creatinine Ratio: 20 (ref 12–28)
BUN: 14 mg/dL (ref 8–27)
CO2: 25 mmol/L (ref 20–29)
Calcium: 9.5 mg/dL (ref 8.7–10.3)
Chloride: 102 mmol/L (ref 96–106)
Creatinine, Ser: 0.7 mg/dL (ref 0.57–1.00)
Glucose: 80 mg/dL (ref 70–99)
Potassium: 4.4 mmol/L (ref 3.5–5.2)
Sodium: 141 mmol/L (ref 134–144)
eGFR: 99 mL/min/{1.73_m2} (ref >59)

## 2022-06-15 ENCOUNTER — Ambulatory Visit
Admission: RE | Admit: 2022-06-15 | Discharge: 2022-06-15 | Disposition: A | Discharge status: Home / Self Care | Payer: Self-pay | Source: Ambulatory Visit | Attending: Nurse Practitioner | Admitting: Nurse Practitioner

## 2022-06-15 DIAGNOSIS — Z780 Asymptomatic menopausal state: Secondary | ICD-10-CM

## 2022-06-23 ENCOUNTER — Ambulatory Visit: Payer: Self-pay | Admitting: *Deleted

## 2022-06-23 NOTE — Telephone Encounter (Signed)
Please advise 

## 2022-06-23 NOTE — Telephone Encounter (Signed)
Summary: antibiotic and painful tooth infection   Pt called to get a RX for amoxicillin for your tooth until she can get her route canal next week / she has an infection / please advise     Attempted to call patient- left message to call office

## 2022-06-23 NOTE — Telephone Encounter (Signed)
Her dentist should be able to prescribe abx if they feel she has a true dental infection.

## 2022-06-23 NOTE — Telephone Encounter (Signed)
Summary: antibiotic and painful tooth infection    Pt called to get a RX for amoxicillin for your tooth until she can get her route canal next week / she has an infection / please advise      Chief Complaint: tooth infection, toothache Symptoms: pain Frequency: 1 month Pertinent Negatives: Patient denies fever, facial swelling Disposition: '[]'$ ED /'[]'$ Urgent Care (no appt availability in office) / '[]'$ Appointment(In office/virtual)/ '[]'$  [facility]/ '[]'$ Home Care/ '[]'$ Refused Recommended Disposition /'[]'$ [facility]/ '[x]'$  Follow-up with PCP Additional Notes: pt's dentist requested pt to call PCP for another round of amoxicillin   Reason for Disposition  Toothache present > 24 hours    Pt stated dentist asked pt to call PCP for another round of abx.  Answer Assessment - Initial Assessment Questions 1. LOCATION: "Which tooth is hurting?"  (e.g., right-side/left-side, upper/lower, front/back)     Upper right molar 2. ONSET: "When did the toothache start?"  (e.g., hours, days)      1 month ago 3. SEVERITY: "How bad is the toothache?"  (Scale 1-10; mild, moderate or severe)   - MILD (1-3): doesn't interfere with chewing    - MODERATE (4-7): interferes with chewing, interferes with normal activities, awakens from sleep     - SEVERE (8-10): unable to eat, unable to do any normal activities, excruciating pain        Moderate- cannot chew 4. SWELLING: "Is there any visible swelling of your face?"     no 5. OTHER SYMPTOMS: "Do you have any other symptoms?" (e.g., fever)     no 6. PREGNANCY: "Is there any chance you are pregnant?" "When was your last menstrual period?"     N/a  Protocols used: Toothache-A-AH

## 2022-06-24 NOTE — Telephone Encounter (Signed)
Pt aware and will call and request rx from her dentist.

## 2022-07-21 ENCOUNTER — Other Ambulatory Visit: Payer: Self-pay | Admitting: Nurse Practitioner

## 2022-07-21 ENCOUNTER — Other Ambulatory Visit: Payer: Self-pay

## 2022-07-21 DIAGNOSIS — R0982 Postnasal drip: Secondary | ICD-10-CM

## 2022-07-21 MED ORDER — FLUTICASONE PROPIONATE 50 MCG/ACT NA SUSP
2.0000 | Freq: Every day | NASAL | 1 refills | Status: DC
  Filled 2022-07-21 – 2023-01-03 (×2): qty 16, 30d supply, fill #0

## 2022-08-06 ENCOUNTER — Other Ambulatory Visit: Payer: Self-pay | Admitting: Nurse Practitioner

## 2022-08-06 ENCOUNTER — Other Ambulatory Visit: Payer: Self-pay

## 2022-08-06 DIAGNOSIS — L243 Irritant contact dermatitis due to cosmetics: Secondary | ICD-10-CM

## 2022-08-06 MED ORDER — TRIAMCINOLONE ACETONIDE 0.1 % EX CREA
1.0000 | TOPICAL_CREAM | Freq: Two times a day (BID) | CUTANEOUS | 1 refills | Status: DC
  Filled 2022-08-06: qty 60, 30d supply, fill #0
  Filled 2022-12-06 (×2): qty 60, 30d supply, fill #1

## 2022-08-06 NOTE — Telephone Encounter (Signed)
Requested medication (s) are due for refill today: yes  Requested medication (s) are on the active medication list: yes  Last refill:  01/18/20 60 g 1 RF  Future visit scheduled: no  Notes to clinic:  med not delegated to NT to reorder    Requested Prescriptions  Pending Prescriptions Disp Refills   triamcinolone cream (KENALOG) 0.1 % 60 g 1    Sig: Apply 1 application topically 2 (two) times daily.     Not Delegated - Dermatology:  Corticosteroids Failed - 08/06/2022  1:57 PM      Failed - This refill cannot be delegated      Passed - Valid encounter within last 12 months    Recent Outpatient Visits           2 months ago Hypernatremia   [place], Vernia Buff, NP   7 months ago Cellulitis of left ear   [place], [place]   7 months ago Anxiety state   [facility], Loraine Grip, PA-C   8 months ago Encounter for annual physical exam   [place], Vernia Buff, NP   1 year ago Generalized abdominal pain   [place], Vernia Buff, NP

## 2022-09-15 ENCOUNTER — Ambulatory Visit: Payer: Self-pay

## 2022-09-15 NOTE — Telephone Encounter (Signed)
Patient was given options for sister clinic PCE, she declined at this time d/t unpleasant experience d/t  provider's dx and is going to UC.   Appreciative of efforts to get an apt for her.

## 2022-09-15 NOTE — Telephone Encounter (Signed)
  Chief Complaint: left ear ache, left eye pain Symptoms: neck pain, left shoulder pain, sclera red, dry scratchy eyes, occasional left cheek pain, anxiety makes sx worse, direct sunlight cause pain to left ear, left ear swelling during the day and goes away at night Frequency: 1 year but worsened 2 weeks ago Pertinent Negatives: Patient denies headache, stiff neck, dizziness, vomiting, runny nose Disposition: '[]'$ ED /'[x]'$ Urgent Care (no appt availability in office) / '[]'$ Appointment(In office/virtual)/ '[]'$  [facility] [place]/ '[]'$ Home Care/ '[]'$ Refused Recommended Disposition /'[]'$ [facility]/ '[]'$  Follow-up with PCP Additional Notes: No available appts in office. Can pt get appt elsewhere at a "sister " clinic. Pt is going to call Dr. Louanne Skye also. Pt stated she had PT, neck traction and dry needling and sx improved. Pt stated she will call back with information after she calls Dr. Otho Ket office. Reason for Disposition  Earache  (Exceptions: brief ear pain of < 60 minutes duration, earache occurring during air travel  Answer Assessment - Initial Assessment Questions 1. LOCATION: "Which ear is involved?"     Left ear 2. ONSET: "When did the ear start hurting"      1 year  3. SEVERITY: "How bad is the pain?"  (Scale 1-10; mild, moderate or severe)   - MILD (1-3): doesn't interfere with normal activities    - MODERATE (4-7): interferes with normal activities or awakens from sleep    - SEVERE (8-10): excruciating pain, unable to do any normal activities      Ear: mild    VZD:GLOVF pain with anxiety (comes and goes) dull eye socket, scratchy, gritty 4. URI SYMPTOMS: "Do you have a runny nose or cough?"     No - post nasal drip 5. FEVER: "Do you have a fever?" If Yes, ask: "What is your temperature, how was it measured, and when did it start?"     no 6. CAUSE: "Have you been swimming recently?", "How often do you use Q-TIPS?", "Have you had any recent air travel or scuba diving?"     no 7.  OTHER SYMPTOMS: "Do you have any other symptoms?" (e.g., headache, stiff neck, dizziness, vomiting, runny nose, decreased hearing)     Eye pain and sclera, left ear swelling during day and redness 8. PREGNANCY: "Is there any chance you are pregnant?" "When was your last menstrual period?"     *No Answer*  Protocols used: Bethann Punches

## 2022-09-21 ENCOUNTER — Ambulatory Visit: Payer: Self-pay | Admitting: Orthopedic Surgery

## 2022-09-22 ENCOUNTER — Ambulatory Visit: Payer: No Typology Code available for payment source | Admitting: Dermatology

## 2022-10-08 ENCOUNTER — Ambulatory Visit: Payer: Self-pay | Admitting: Orthopedic Surgery

## 2022-10-19 ENCOUNTER — Other Ambulatory Visit: Payer: Self-pay

## 2022-10-21 ENCOUNTER — Other Ambulatory Visit: Payer: Self-pay

## 2022-10-27 ENCOUNTER — Telehealth: Payer: Self-pay | Admitting: Nurse Practitioner

## 2022-10-27 NOTE — Telephone Encounter (Signed)
Copied from [employer] [id number]. Topic: Appointment Scheduling - Scheduling Inquiry for Clinic >> Oct 27, 2022  2:18 PM [name] wrote: Reason for CRM: pt would like a call back concerning the OC.

## 2022-12-06 ENCOUNTER — Other Ambulatory Visit: Payer: Self-pay

## 2022-12-13 ENCOUNTER — Other Ambulatory Visit: Payer: Self-pay | Admitting: Obstetrics and Gynecology

## 2022-12-13 DIAGNOSIS — Z1231 Encounter for screening mammogram for malignant neoplasm of breast: Secondary | ICD-10-CM

## 2023-01-03 ENCOUNTER — Other Ambulatory Visit: Payer: Self-pay

## 2023-01-06 ENCOUNTER — Inpatient Hospital Stay: Admission: RE | Admit: 2023-01-06 | Payer: Self-pay | Source: Ambulatory Visit

## 2023-01-06 ENCOUNTER — Ambulatory Visit: Payer: Self-pay

## 2023-02-14 ENCOUNTER — Other Ambulatory Visit: Payer: Self-pay

## 2023-02-14 DIAGNOSIS — Z1231 Encounter for screening mammogram for malignant neoplasm of breast: Secondary | ICD-10-CM

## 2023-02-16 ENCOUNTER — Encounter: Payer: Medicaid Other | Admitting: Nurse Practitioner

## 2023-02-17 ENCOUNTER — Ambulatory Visit: Payer: Medicaid Other

## 2023-02-17 ENCOUNTER — Ambulatory Visit
Admission: RE | Admit: 2023-02-17 | Discharge: 2023-02-17 | Disposition: A | Discharge status: Home / Self Care | Payer: Medicaid Other | Source: Ambulatory Visit | Attending: Obstetrics and Gynecology | Admitting: Obstetrics and Gynecology

## 2023-02-17 DIAGNOSIS — Z1231 Encounter for screening mammogram for malignant neoplasm of breast: Secondary | ICD-10-CM

## 2023-02-23 DIAGNOSIS — H1820 Unspecified corneal edema: Secondary | ICD-10-CM | POA: Diagnosis not present

## 2023-02-23 DIAGNOSIS — H10502 Unspecified blepharoconjunctivitis, left eye: Secondary | ICD-10-CM | POA: Diagnosis not present

## 2023-03-03 DIAGNOSIS — H10502 Unspecified blepharoconjunctivitis, left eye: Secondary | ICD-10-CM | POA: Diagnosis not present

## 2023-03-03 DIAGNOSIS — T1512XA Foreign body in conjunctival sac, left eye, initial encounter: Secondary | ICD-10-CM | POA: Diagnosis not present

## 2023-03-08 ENCOUNTER — Other Ambulatory Visit: Payer: Self-pay

## 2023-03-08 ENCOUNTER — Other Ambulatory Visit: Payer: Self-pay | Admitting: Family Medicine

## 2023-03-08 DIAGNOSIS — L243 Irritant contact dermatitis due to cosmetics: Secondary | ICD-10-CM

## 2023-03-08 MED ORDER — TRIAMCINOLONE ACETONIDE 0.1 % EX CREA
1.0000 | TOPICAL_CREAM | Freq: Two times a day (BID) | CUTANEOUS | 0 refills | Status: DC
Start: 2023-03-08 — End: 2023-04-20
  Filled 2023-03-08: qty 60, 30d supply, fill #0
  Filled 2023-03-15: qty 60, 28d supply, fill #0

## 2023-03-14 ENCOUNTER — Other Ambulatory Visit: Payer: Self-pay

## 2023-03-15 ENCOUNTER — Other Ambulatory Visit: Payer: Self-pay

## 2023-03-15 DIAGNOSIS — H04123 Dry eye syndrome of bilateral lacrimal glands: Secondary | ICD-10-CM | POA: Diagnosis not present

## 2023-03-16 ENCOUNTER — Encounter: Payer: Medicaid Other | Admitting: Nurse Practitioner

## 2023-03-20 ENCOUNTER — Telehealth: Payer: Medicaid Other | Admitting: Physician Assistant

## 2023-03-20 DIAGNOSIS — J018 Other acute sinusitis: Secondary | ICD-10-CM | POA: Diagnosis not present

## 2023-03-20 DIAGNOSIS — R051 Acute cough: Secondary | ICD-10-CM | POA: Diagnosis not present

## 2023-03-20 MED ORDER — BENZONATATE 100 MG PO CAPS
100.0000 mg | ORAL_CAPSULE | Freq: Three times a day (TID) | ORAL | 0 refills | Status: DC | PRN
Start: 2023-03-20 — End: 2023-04-20

## 2023-03-20 MED ORDER — AMOXICILLIN-POT CLAVULANATE 875-125 MG PO TABS
1.0000 | ORAL_TABLET | Freq: Two times a day (BID) | ORAL | 0 refills | Status: DC
Start: 2023-03-20 — End: 2023-04-20

## 2023-03-20 NOTE — Progress Notes (Signed)
Virtual Visit Consent   [name], you are scheduled for a virtual visit with a [facility] provider today. Just as with appointments in the office, your consent must be obtained to participate. Your consent will be active for this visit and any virtual visit you may have with one of our providers in the next 365 days. If you have a MyChart account, a copy of this consent can be sent to you electronically.  As this is a virtual visit, video technology does not allow for your provider to perform a traditional examination. This may limit your provider's ability to fully assess your condition. If your provider identifies any concerns that need to be evaluated in person or the need to arrange testing (such as labs, EKG, etc.), we will make arrangements to do so. Although advances in technology are sophisticated, we cannot ensure that it will always work on either your end or our end. If the connection with a video visit is poor, the visit may have to be switched to a telephone visit. With either a video or telephone visit, we are not always able to ensure that we have a secure connection.  By engaging in this virtual visit, you consent to the provision of healthcare and authorize for your insurance to be billed (if applicable) for the services provided during this visit. Depending on your insurance coverage, you may receive a charge related to this service.  I need to obtain your verbal consent now. Are you willing to proceed with your visit today? [name] has provided verbal consent on 03/20/2023 for a virtual visit (video or telephone). [name] Ward, PA-C  Date: 03/20/2023 3:00 PM  Virtual Visit via Video Note   I, [name] Ward, connected with  [name]  ([id number], 05/27/1961) on 03/20/23 at  3:00 PM EDT by a video-enabled telemedicine application and verified that I am speaking with the correct person using two identifiers.  Location: Patient: Virtual Visit  Location Patient: Home Provider: Virtual Visit Location Provider: Home   I discussed the limitations of evaluation and management by telemedicine and the availability of in person appointments. The patient expressed understanding and agreed to proceed.    History of Present Illness: [name] is a 62 y.o. who identifies as a female who was assigned female at birth, and is being seen today for a week and half of congestion, sinus pressure, and cough.  Reports yellowish phlegm.  She denies fever, shortness of breath, wheezing.  She is using flonase, vitamin C.  She reports no relief .  HPI: HPI  Problems:  Patient Active Problem List   Diagnosis Date Noted   Abdominal pain, epigastric 05/28/2019   Bloating 05/28/2019   Suprapubic abdominal pain 05/28/2019   Gallbladder polyp 05/28/2019   Family history of abdominal aortic aneurysm (AAA) 05/17/2017   Elevated C-reactive protein (CRP) 11/01/2016   Dilated aortic root (HCC) 11/01/2016   Pruritic rash 07/06/2016   Electrocution 08/26/2015   Left-sided low back pain without sciatica 08/26/2015   Post-nasal drip 08/26/2015   Pain, dental 07/15/2015   De Quervain's tenosynovitis, right 04/10/2015   Pain in joint, shoulder region 02/22/2014   Aortic insufficiency 01/29/2014   Atrial fibrillation (HCC) 01/09/2014   Chest pain 01/09/2014   OTHER AND UNSPECIFIED OVARIAN CYST 09/08/2010   GERD 08/10/2010   Anxiety state 02/02/2010    Allergies:  Allergies  Allergen Reactions   Azithromycin     Patient told not to take this  med due to A-fib.   Sausage [Pickled Meat] Other (See Comments)    Causes migraines   Sulfamethoxazole Other (See Comments)    flushing   Medications:  Current Outpatient Medications:    amoxicillin-clavulanate (AUGMENTIN) 875-125 MG tablet, Take 1 tablet by mouth 2 (two) times daily., Disp: 20 tablet, Rfl: 0   benzonatate (TESSALON) 100 MG capsule, Take 1 capsule (100 mg total) by mouth 3 (three) times  daily as needed., Disp: 20 capsule, Rfl: 0   Ascorbic Acid (VITAMIN C) 1000 MG tablet, Take 1,000 mg by mouth daily., Disp: , Rfl:    aspirin 81 MG chewable tablet, Chew 81 mg by mouth daily., Disp: , Rfl:    calcium carbonate (TUMS EX) 750 MG chewable tablet, Chew 1 tablet by mouth daily., Disp: , Rfl:    Cholecalciferol (D3-1000 PO), Take 1 tablet by mouth daily., Disp: , Rfl:    fluticasone (FLONASE) 50 MCG/ACT nasal spray, Place 2 sprays into both nostrils daily., Disp: 16 g, Rfl: 1   loratadine (CLARITIN) 10 MG tablet, Take 10 mg by mouth daily., Disp: , Rfl:    metoprolol succinate (TOPROL-XL) 25 MG 24 hr tablet, Take 0.5 tablets (12.5 mg total) by mouth daily., Disp: 45 tablet, Rfl: 3   triamcinolone cream (KENALOG) 0.1 %, Apply 1 application topically 2 (two) times daily., Disp: 60 g, Rfl: 0  Observations/Objective: Patient is well-developed, well-nourished in no acute distress.  Resting comfortably at home.  Head is normocephalic, atraumatic.  No labored breathing.  Speech is clear and coherent with logical content.  Patient is alert and oriented at baseline.    Assessment and Plan: 1. Acute non-recurrent sinusitis of other sinus - amoxicillin-clavulanate (AUGMENTIN) 875-125 MG tablet; Take 1 tablet by mouth 2 (two) times daily.  Dispense: 20 tablet; Refill: 0  2. Acute cough - benzonatate (TESSALON) 100 MG capsule; Take 1 capsule (100 mg total) by mouth 3 (three) times daily as needed.  Dispense: 20 capsule; Refill: 0  Supportive care discussed, in person evaluation precautions discussed.   Follow Up Instructions: I discussed the assessment and treatment plan with the patient. The patient was provided an opportunity to ask questions and all were answered. The patient agreed with the plan and demonstrated an understanding of the instructions.  A copy of instructions were sent to the patient via MyChart unless otherwise noted below.     The patient was advised to call back or  seek an in-person evaluation if the symptoms worsen or if the condition fails to improve as anticipated.  Time:  I spent 12 minutes with the patient via telehealth technology discussing the above problems/concerns.    [name] Ward, PA-C

## 2023-03-20 NOTE — Patient Instructions (Signed)
Pixie Casino, thank you for joining [name] Ward, PA-C for today's virtual visit.  While this provider is not your primary care provider (PCP), if your PCP is located in our provider database this encounter information will be shared with them immediately following your visit.   A [facility] MyChart account gives you access to today's visit and all your visits, tests, and labs performed at [facility] " click here if you don't have a [facility] MyChart account or go to [email or web address]  Consent: (Patient) Pixie Casino provided verbal consent for this virtual visit at the beginning of the encounter.  Current Medications:  Current Outpatient Medications:    amoxicillin-clavulanate (AUGMENTIN) 875-125 MG tablet, Take 1 tablet by mouth 2 (two) times daily., Disp: 20 tablet, Rfl: 0   benzonatate (TESSALON) 100 MG capsule, Take 1 capsule (100 mg total) by mouth 3 (three) times daily as needed., Disp: 20 capsule, Rfl: 0   Ascorbic Acid (VITAMIN C) 1000 MG tablet, Take 1,000 mg by mouth daily., Disp: , Rfl:    aspirin 81 MG chewable tablet, Chew 81 mg by mouth daily., Disp: , Rfl:    calcium carbonate (TUMS EX) 750 MG chewable tablet, Chew 1 tablet by mouth daily., Disp: , Rfl:    Cholecalciferol (D3-1000 PO), Take 1 tablet by mouth daily., Disp: , Rfl:    fluticasone (FLONASE) 50 MCG/ACT nasal spray, Place 2 sprays into both nostrils daily., Disp: 16 g, Rfl: 1   loratadine (CLARITIN) 10 MG tablet, Take 10 mg by mouth daily., Disp: , Rfl:    metoprolol succinate (TOPROL-XL) 25 MG 24 hr tablet, Take 0.5 tablets (12.5 mg total) by mouth daily., Disp: 45 tablet, Rfl: 3   triamcinolone cream (KENALOG) 0.1 %, Apply 1 application topically 2 (two) times daily., Disp: 60 g, Rfl: 0   Medications ordered in this encounter:  Meds ordered this encounter  Medications   benzonatate (TESSALON) 100 MG capsule    Sig: Take 1 capsule (100 mg total) by mouth 3 (three) times  daily as needed.    Dispense:  20 capsule    Refill:  0    Order Specific Question:   Supervising Provider    Answer:   Merrilee Jansky [id number]   amoxicillin-clavulanate (AUGMENTIN) 875-125 MG tablet    Sig: Take 1 tablet by mouth 2 (two) times daily.    Dispense:  20 tablet    Refill:  0    Order Specific Question:   Supervising Provider    Answer:   Merrilee Jansky [id number]     *If you need refills on other medications prior to your next appointment, please contact your pharmacy*  Follow-Up: Call back or seek an in-person evaluation if the symptoms worsen or if the condition fails to improve as anticipated.  [facility] [phone]  Other Instructions Take antibiotic as prescribed.  Can take tessalon as needed for cough.  Continue with flonase, drink plenty of fluids.  If no improvement or symptoms become worse follow up for in person evaluation with your PCP or Urgent Care.    If you have been instructed to have an in-person evaluation today at a local Urgent Care facility, please use the link below. It will take you to a list of all of our available [facility], including address, phone number and hours of operation. Please do not delay care.  [facility]  If you or a family member do not have  a primary care provider, use the link below to schedule a visit and establish care. When you choose a [facility] primary care physician or advanced practice provider, you gain a long-term partner in health. Find a Primary Care Provider  Learn more about [facility]'s in-office and virtual care options: [facility] - Get Care Now

## 2023-04-20 ENCOUNTER — Other Ambulatory Visit: Payer: Self-pay

## 2023-04-20 ENCOUNTER — Encounter: Payer: Self-pay | Admitting: Nurse Practitioner

## 2023-04-20 ENCOUNTER — Ambulatory Visit: Payer: Medicaid Other | Attending: Nurse Practitioner | Admitting: Nurse Practitioner

## 2023-04-20 VITALS — BP 114/75 | Ht 68.0 in | Wt 169.6 lb

## 2023-04-20 DIAGNOSIS — Z7689 Persons encountering health services in other specified circumstances: Secondary | ICD-10-CM | POA: Diagnosis not present

## 2023-04-20 DIAGNOSIS — E7800 Pure hypercholesterolemia, unspecified: Secondary | ICD-10-CM

## 2023-04-20 DIAGNOSIS — D649 Anemia, unspecified: Secondary | ICD-10-CM

## 2023-04-20 DIAGNOSIS — L243 Irritant contact dermatitis due to cosmetics: Secondary | ICD-10-CM | POA: Diagnosis not present

## 2023-04-20 DIAGNOSIS — Z8679 Personal history of other diseases of the circulatory system: Secondary | ICD-10-CM

## 2023-04-20 DIAGNOSIS — Z0001 Encounter for general adult medical examination with abnormal findings: Secondary | ICD-10-CM | POA: Diagnosis not present

## 2023-04-20 DIAGNOSIS — Z0000 Encounter for general adult medical examination without abnormal findings: Secondary | ICD-10-CM | POA: Diagnosis not present

## 2023-04-20 DIAGNOSIS — L989 Disorder of the skin and subcutaneous tissue, unspecified: Secondary | ICD-10-CM | POA: Diagnosis not present

## 2023-04-20 DIAGNOSIS — Z23 Encounter for immunization: Secondary | ICD-10-CM | POA: Diagnosis not present

## 2023-04-20 DIAGNOSIS — J301 Allergic rhinitis due to pollen: Secondary | ICD-10-CM

## 2023-04-20 MED ORDER — LORATADINE 10 MG PO TABS
10.0000 mg | ORAL_TABLET | Freq: Every day | ORAL | 1 refills | Status: DC
  Filled 2023-04-20: qty 30, 30d supply, fill #0

## 2023-04-20 MED ORDER — TRIAMCINOLONE ACETONIDE 0.1 % EX CREA
1.0000 | TOPICAL_CREAM | Freq: Two times a day (BID) | CUTANEOUS | 1 refills | Status: DC
Start: 2023-04-20 — End: 2023-08-31
  Filled 2023-04-20: qty 60, 28d supply, fill #0
  Filled 2023-05-14: qty 60, 28d supply, fill #1

## 2023-04-20 MED ORDER — METOPROLOL SUCCINATE ER 25 MG PO TB24
12.5000 mg | ORAL_TABLET | Freq: Every day | ORAL | 3 refills | Status: DC
Start: 2023-04-20 — End: 2024-06-21
  Filled 2023-04-20 – 2023-07-11 (×4): qty 45, 90d supply, fill #0
  Filled 2023-09-23: qty 45, 90d supply, fill #1
  Filled 2024-01-06: qty 45, 90d supply, fill #2
  Filled 2024-03-26: qty 45, 90d supply, fill #3
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
## 2023-04-21 ENCOUNTER — Encounter: Payer: Self-pay | Admitting: Nurse Practitioner

## 2023-04-21 LAB — LIPID PANEL
Chol/HDL Ratio: 2.4 ratio (ref 0.0–4.4)
Cholesterol, Total: 196 mg/dL (ref 100–199)
HDL: 82 mg/dL (ref >39)
LDL Chol Calc (NIH): 102 mg/dL — ABNORMAL HIGH (ref 0–99)
Triglycerides: 63 mg/dL (ref 0–149)
VLDL Cholesterol Cal: 12 mg/dL (ref 5–40)

## 2023-04-21 LAB — CMP14+EGFR
ALT: 18 IU/L (ref 0–32)
AST: 26 IU/L (ref 0–40)
Albumin: 4.4 g/dL (ref 3.9–4.9)
Alkaline Phosphatase: 71 IU/L (ref 44–121)
BUN/Creatinine Ratio: 27 (ref 12–28)
BUN: 16 mg/dL (ref 8–27)
Bilirubin Total: 0.5 mg/dL (ref 0.0–1.2)
CO2: 24 mmol/L (ref 20–29)
Calcium: 9.5 mg/dL (ref 8.7–10.3)
Chloride: 101 mmol/L (ref 96–106)
Creatinine, Ser: 0.59 mg/dL (ref 0.57–1.00)
Globulin, Total: 2.8 g/dL (ref 1.5–4.5)
Glucose: 99 mg/dL (ref 70–99)
Potassium: 4.6 mmol/L (ref 3.5–5.2)
Sodium: 139 mmol/L (ref 134–144)
Total Protein: 7.2 g/dL (ref 6.0–8.5)
eGFR: 102 mL/min/{1.73_m2} (ref >59)

## 2023-04-21 LAB — CBC WITH DIFFERENTIAL/PLATELET
Basophils Absolute: 0 10*3/uL (ref 0.0–0.2)
Basos: 1 %
EOS (ABSOLUTE): 0.4 10*3/uL (ref 0.0–0.4)
Eos: 8 %
Hematocrit: 37.1 % (ref 34.0–46.6)
Hemoglobin: 12.1 g/dL (ref 11.1–15.9)
Immature Grans (Abs): 0 10*3/uL (ref 0.0–0.1)
Immature Granulocytes: 0 %
Lymphocytes Absolute: 1.3 10*3/uL (ref 0.7–3.1)
Lymphs: 26 %
MCH: 30.3 pg (ref 26.6–33.0)
MCHC: 32.6 g/dL (ref 31.5–35.7)
MCV: 93 fL (ref 79–97)
Monocytes Absolute: 0.4 10*3/uL (ref 0.1–0.9)
Monocytes: 8 %
Neutrophils Absolute: 2.7 10*3/uL (ref 1.4–7.0)
Neutrophils: 57 %
Platelets: 258 10*3/uL (ref 150–450)
RBC: 4 x10E6/uL (ref 3.77–5.28)
RDW: 12.3 % (ref 11.7–15.4)
WBC: 4.8 10*3/uL (ref 3.4–10.8)

## 2023-04-21 LAB — IRON,TIBC AND FERRITIN PANEL
Ferritin: 40 ng/mL (ref 15–150)
Iron Saturation: 32 % (ref 15–55)
Iron: 120 ug/dL (ref 27–139)
Total Iron Binding Capacity: 373 ug/dL (ref 250–450)
UIBC: 253 ug/dL (ref 118–369)

## 2023-04-21 LAB — HEMOGLOBIN A1C
Est. average glucose Bld gHb Est-mCnc: 117 mg/dL
Hgb A1c MFr Bld: 5.7 % — ABNORMAL HIGH (ref 4.8–5.6)

## 2023-05-12 ENCOUNTER — Ambulatory Visit: Payer: Medicaid Other | Admitting: Internal Medicine

## 2023-07-11 ENCOUNTER — Other Ambulatory Visit (HOSPITAL_COMMUNITY): Payer: Self-pay

## 2023-07-11 ENCOUNTER — Other Ambulatory Visit: Payer: Self-pay

## 2023-08-08 ENCOUNTER — Telehealth: Payer: Self-pay

## 2023-08-08 NOTE — Telephone Encounter (Signed)
Copied from CRM [id number]. Topic: Referral - Request for Referral >> Aug 02, 2023  3:54 PM Haroldine Laws wrote: Reason for CRM: pt provider in June and was to be referred to dermatology and dental.  She never heard back from either.  She would like to be referred to [facility] for Dermatology.  Please advise  [phone]

## 2023-08-09 NOTE — Telephone Encounter (Signed)
Patient informed of now referral.

## 2023-08-27 ENCOUNTER — Other Ambulatory Visit: Payer: Self-pay | Admitting: Family Medicine

## 2023-08-27 DIAGNOSIS — R0982 Postnasal drip: Secondary | ICD-10-CM

## 2023-08-29 ENCOUNTER — Other Ambulatory Visit: Payer: Self-pay

## 2023-08-29 MED ORDER — FLUTICASONE PROPIONATE 50 MCG/ACT NA SUSP
2.0000 | Freq: Every day | NASAL | 1 refills | Status: DC
Start: 2023-08-29 — End: 2023-11-11
  Filled 2023-08-29: qty 16, 30d supply, fill #0
  Filled 2023-09-23: qty 16, 30d supply, fill #1

## 2023-08-29 NOTE — Telephone Encounter (Signed)
Requested Prescriptions  Pending Prescriptions Disp Refills   fluticasone (FLONASE) 50 MCG/ACT nasal spray 16 g 1    Sig: Place 2 sprays into both nostrils daily.     Ear, Nose, and Throat: Nasal Preparations - Corticosteroids Passed - 08/27/2023  1:03 PM      Passed - Valid encounter within last 12 months    Recent Outpatient Visits           4 months ago Encounter for annual physical exam   [facility] [place], Shea Stakes, NP   1 year ago Hypernatremia   [facility] Claiborne Rigg, NP   1 year ago Cellulitis of left ear   [facility] [place], Marzella Schlein, [place]   1 year ago Anxiety state   [facility], Kasandra Knudsen, [place]   1 year ago Encounter for annual physical exam   [facility] Claiborne Rigg, NP       Future Appointments             In 1 month Hilty, Lisette Abu, MD [facility]   In 1 month Claiborne Rigg, NP [facility]

## 2023-08-31 ENCOUNTER — Other Ambulatory Visit: Payer: Self-pay | Admitting: Nurse Practitioner

## 2023-08-31 DIAGNOSIS — L243 Irritant contact dermatitis due to cosmetics: Secondary | ICD-10-CM

## 2023-09-01 ENCOUNTER — Other Ambulatory Visit: Payer: Self-pay

## 2023-09-01 MED ORDER — TRIAMCINOLONE ACETONIDE 0.1 % EX CREA
1.0000 | TOPICAL_CREAM | Freq: Two times a day (BID) | CUTANEOUS | 0 refills | Status: DC
  Filled 2023-09-01: qty 60, 28d supply, fill #0

## 2023-09-02 ENCOUNTER — Other Ambulatory Visit: Payer: Self-pay

## 2023-09-26 ENCOUNTER — Other Ambulatory Visit: Payer: Self-pay

## 2023-10-03 ENCOUNTER — Encounter: Payer: Self-pay | Admitting: Internal Medicine

## 2023-10-03 ENCOUNTER — Ambulatory Visit: Payer: Medicaid Other | Attending: Internal Medicine | Admitting: Internal Medicine

## 2023-10-03 VITALS — BP 114/76 | HR 74 | Ht 68.0 in | Wt 164.0 lb

## 2023-10-03 DIAGNOSIS — I351 Nonrheumatic aortic (valve) insufficiency: Secondary | ICD-10-CM

## 2023-10-03 DIAGNOSIS — I77810 Thoracic aortic ectasia: Secondary | ICD-10-CM

## 2023-10-03 DIAGNOSIS — I480 Paroxysmal atrial fibrillation: Secondary | ICD-10-CM | POA: Diagnosis not present
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
## 2023-10-03 NOTE — Patient Instructions (Signed)
Medication Instructions:  NO CHANGES  *If you need a refill on your cardiac medications before your next appointment, please call your pharmacy*  Testing/Procedures: Your physician has requested that you have an echocardiogram. Echocardiography is a painless test that uses sound waves to create images of your heart. It provides your doctor with information about the size and shape of your heart and how well your heart's chambers and valves are working. This procedure takes approximately one hour. There are no restrictions for this procedure. Please do NOT wear cologne, perfume, aftershave, or lotions (deodorant is allowed). Please arrive 15 minutes prior to your appointment time.  Please note: We ask at that you not bring children with you during ultrasound (echo/ vascular) testing. Due to room size and safety concerns, children are not allowed in the ultrasound rooms during exams. Our front office staff cannot provide observation of children in our lobby area while testing is being conducted. An adult accompanying a patient to their appointment will only be allowed in the ultrasound room at the discretion of the ultrasound technician under special circumstances. We apologize for any inconvenience. DUE April 2025   Follow-Up: At [facility], you and your health needs are our priority.  As part of our continuing mission to provide you with exceptional heart care, we have created designated Provider Care Teams.  These Care Teams include your primary Cardiologist (physician) and Advanced Practice Providers (APPs -  Physician Assistants and Nurse Practitioners) who all work together to provide you with the care you need, when you need it.  We recommend signing up for the patient portal called "MyChart".  Sign up information is provided on this After Visit Summary.  MyChart is used to connect with patients for Virtual Visits (Telemedicine).  Patients are able to view lab/test results, encounter  notes, upcoming appointments, etc.  Non-urgent messages can be sent to your provider as well.   To learn more about what you can do with MyChart, go to [email or web address].    Your next appointment:   12 months with Dr. Rennis Golden

## 2023-10-21 ENCOUNTER — Ambulatory Visit: Payer: Medicaid Other | Admitting: Nurse Practitioner

## 2023-11-11 ENCOUNTER — Other Ambulatory Visit: Payer: Self-pay | Admitting: Nurse Practitioner

## 2023-11-11 DIAGNOSIS — R0982 Postnasal drip: Secondary | ICD-10-CM

## 2023-11-14 ENCOUNTER — Other Ambulatory Visit: Payer: Self-pay

## 2023-11-14 MED ORDER — FLUTICASONE PROPIONATE 50 MCG/ACT NA SUSP
2.0000 | Freq: Every day | NASAL | 1 refills | Status: AC
Start: 2023-11-14 — End: ?
  Filled 2023-11-14: qty 16, 30d supply, fill #0

## 2023-11-14 NOTE — Telephone Encounter (Signed)
Requested Prescriptions  Pending Prescriptions Disp Refills   fluticasone (FLONASE) 50 MCG/ACT nasal spray 16 g 1    Sig: Place 2 sprays into both nostrils daily.     Ear, Nose, and Throat: Nasal Preparations - Corticosteroids Passed - 11/14/2023  8:32 AM      Passed - Valid encounter within last 12 months    Recent Outpatient Visits           6 months ago Encounter for annual physical exam   [facility] Of [place]. [facility] Claiborne Rigg, NP   1 year ago Hypernatremia   [facility] - A Dept Of [place]. [facility] Claiborne Rigg, NP   1 year ago Cellulitis of left ear   [place] - A Dept Of [place]. [facility] [place], Marzella Schlein, [place]   1 year ago Anxiety state   [facility], Kasandra Knudsen, [place]   1 year ago Encounter for annual physical exam   [place] [facility] - A Dept Of . [facility] Claiborne Rigg, NP       Future Appointments             In 2 weeks Claiborne Rigg, NP [facility] - A Dept Of Eligha Bridegroom. [facility]   In 4 months Terri Piedra, DO [facility]

## 2023-11-15 ENCOUNTER — Other Ambulatory Visit: Payer: Self-pay

## 2023-11-30 ENCOUNTER — Ambulatory Visit: Payer: Medicaid Other | Admitting: Nurse Practitioner

## 2023-12-06 ENCOUNTER — Telehealth: Payer: Medicaid Other | Admitting: Physician Assistant

## 2023-12-06 DIAGNOSIS — R6889 Other general symptoms and signs: Secondary | ICD-10-CM | POA: Diagnosis not present

## 2023-12-06 MED ORDER — BENZONATATE 100 MG PO CAPS
100.0000 mg | ORAL_CAPSULE | Freq: Three times a day (TID) | ORAL | 0 refills | Status: DC | PRN
Start: 2023-12-06 — End: 2024-03-27

## 2023-12-06 MED ORDER — OSELTAMIVIR PHOSPHATE 75 MG PO CAPS
75.0000 mg | ORAL_CAPSULE | Freq: Two times a day (BID) | ORAL | 0 refills | Status: AC
Start: 2023-12-06 — End: 2023-12-11

## 2023-12-06 NOTE — Patient Instructions (Signed)
Pixie Casino, thank you for joining [name], PA-C for today's virtual visit.  While this provider is not your primary care provider (PCP), if your PCP is located in our provider database this encounter information will be shared with them immediately following your visit.   A [facility] MyChart account gives you access to today's visit and all your visits, tests, and labs performed at [facility] " click here if you don't have a [facility] MyChart account or go to [email or web address]  Consent: (Patient) Pixie Casino provided verbal consent for this virtual visit at the beginning of the encounter.  Current Medications:  Current Outpatient Medications:    Ascorbic Acid (VITAMIN C) 1000 MG tablet, Take 1,000 mg by mouth daily., Disp: , Rfl:    aspirin 81 MG chewable tablet, Chew 81 mg by mouth daily., Disp: , Rfl:    calcium carbonate (TUMS EX) 750 MG chewable tablet, Chew 1 tablet by mouth daily., Disp: , Rfl:    Cholecalciferol (D3-1000 PO), Take 1 tablet by mouth daily., Disp: , Rfl:    fluticasone (FLONASE) 50 MCG/ACT nasal spray, Place 2 sprays into both nostrils daily., Disp: 16 g, Rfl: 1   loratadine (CLARITIN) 10 MG tablet, Take 1 tablet (10 mg total) by mouth daily. (Patient taking differently: Take 10 mg by mouth daily as needed.), Disp: 90 tablet, Rfl: 1   MAGNESIUM GLYCINATE PO, Take 700 mg by mouth in the morning and at bedtime., Disp: , Rfl:    metoprolol succinate (TOPROL-XL) 25 MG 24 hr tablet, Take 0.5 tablets (12.5 mg total) by mouth daily., Disp: 45 tablet, Rfl: 3   triamcinolone cream (KENALOG) 0.1 %, Apply 1 Application topically 2 (two) times daily. (Patient taking differently: Apply 1 Application topically 2 (two) times daily as needed.), Disp: 60 g, Rfl: 0   Medications ordered in this encounter:  No orders of the defined types were placed in this encounter.    *If you need refills on other medications prior to your next  appointment, please contact your pharmacy*  Follow-Up: Call back or seek an in-person evaluation if the symptoms worsen or if the condition fails to improve as anticipated.   Virtual Care [phone]  Other Instructions Please keep well-hydrated and try to get plenty of rest. If you have a humidifier, place it in the bedroom and run it at night. Start a saline nasal rinse for nasal congestion. You can consider use of a nasal steroid spray like Flonase or Nasacort OTC. You can alternate between Tylenol and Ibuprofen if needed for fever, body aches, headache and/or throat pain. Salt water-gargles and chloraseptic spray can be very beneficial for sore throat. Mucinex-DM for congestion or cough. Please take all prescribed medications as directed.  Remain out of work until [employer] for 24 hours without a fever-reducing medication, and you are feeling better.  You should mask until symptoms are resolved.  If anything worsens despite treatment, you need to be evaluated in-person. Please do not delay care.  Influenza, Adult Influenza is also called "the flu." It is an infection in the lungs, nose, and throat (respiratory tract). It spreads easily from person to person (is contagious). The flu causes symptoms that are like a cold, along with high fever and body aches. What are the causes? This condition is caused by the influenza virus. You can get the virus by: Breathing in droplets that are in the air after a person infected with the flu coughed or sneezed. Touching  something that has the virus on it and then touching your mouth, nose, or eyes. What increases the risk? Certain things may make you more likely to get the flu. These include: Not washing your hands often. Having close contact with many people during cold and flu season. Touching your mouth, eyes, or nose without first washing your hands. Not getting a flu shot every year. You may have a higher risk for the flu,  and serious problems, such as a lung infection (pneumonia), if you: Are older than 65. Are pregnant. Have a weakened disease-fighting system (immune system) because of a disease or because you are taking certain medicines. Have a long-term (chronic) condition, such as: Heart, kidney, or lung disease. Diabetes. Asthma. Have a liver disorder. Are very overweight (morbidly obese). Have anemia. What are the signs or symptoms? Symptoms usually begin suddenly and last 4-14 days. They may include: Fever and chills. Headaches, body aches, or muscle aches. Sore throat. Cough. Runny or stuffy (congested) nose. Feeling discomfort in your chest. Not wanting to eat as much as normal. Feeling weak or tired. Feeling dizzy. Feeling sick to your stomach or throwing up. How is this treated? If the flu is found early, you can be treated with antiviral medicine. This can help to reduce how bad the illness is and how long it lasts. This may be given by mouth or through an IV tube. Taking care of yourself at home can help your symptoms get better. Your doctor may want you to: Take over-the-counter medicines. Drink plenty of fluids. The flu often goes away on its own. If you have very bad symptoms or other problems, you may be treated in a hospital. Follow these instructions at home:     Activity Rest as needed. Get plenty of sleep. Stay home from work or school as told by your doctor. Do not leave home until you do not have a fever for 24 hours without taking medicine. Leave home only to go to your doctor. Eating and drinking Take an ORS (oral rehydration solution). This is a drink that is sold at pharmacies and stores. Drink enough fluid to keep your pee pale yellow. Drink clear fluids in small amounts as you are able. Clear fluids include: Water. Ice chips. Fruit juice mixed with water. Low-calorie sports drinks. Eat bland foods that are easy to digest. Eat small amounts as you are able.  These foods include: Bananas. Applesauce. Rice. Lean meats. Toast. Crackers. Do not eat or drink: Fluids that have a lot of sugar or caffeine. Alcohol. Spicy or fatty foods. General instructions Take over-the-counter and prescription medicines only as told by your doctor. Use a cool mist humidifier to add moisture to the air in your home. This can make it easier for you to breathe. When using a cool mist humidifier, clean it daily. Empty water and replace with clean water. Cover your mouth and nose when you cough or sneeze. Wash your hands with soap and water often and for at least 20 seconds. This is also important after you cough or sneeze. If you cannot use soap and water, use alcohol-based hand sanitizer. Keep all follow-up visits. How is this prevented?  Get a flu shot every year. You may get the flu shot in late summer, fall, or winter. Ask your doctor when you should get your flu shot. Avoid contact with people who are sick during fall and winter. This is cold and flu season. Contact a doctor if: You get new symptoms. You have:  Chest pain. Watery poop (diarrhea). A fever. Your cough gets worse. You start to have more mucus. You feel sick to your stomach. You throw up. Get help right away if you: Have shortness of breath. Have trouble breathing. Have skin or nails that turn a bluish color. Have very bad pain or stiffness in your neck. Get a sudden headache. Get sudden pain in your face or ear. Cannot eat or drink without throwing up. These symptoms may represent a serious problem that is an emergency. Get medical help right away. Call your local emergency services (911 in the [place] Do not wait to see if the symptoms will go away. Do not drive yourself to the hospital. Summary Influenza is also called "the flu." It is an infection in the lungs, nose, and throat. It spreads easily from person to person. Take over-the-counter and prescription medicines only as told by  your doctor. Getting a flu shot every year is the best way to not get the flu. This information is not intended to replace advice given to you by your health care provider. Make sure you discuss any questions you have with your health care provider. Document Revised: 05/30/2020 Document Reviewed: 05/30/2020 Elsevier Patient Education  2023 Elsevier Inc.      If you have been instructed to have an in-person evaluation today at a local Urgent Care facility, please use the link below. It will take you to a list of all of our available [facility], including address, phone number and hours of operation. Please do not delay care.  [facility]  If you or a family member do not have a primary care provider, use the link below to schedule a visit and establish care. When you choose a [facility] primary care physician or advanced practice provider, you gain a long-term partner in health. Find a Primary Care Provider  Learn more about [facility]'s in-office and virtual care options:  - Get Care Now

## 2023-12-06 NOTE — Progress Notes (Signed)
Virtual Visit Consent   [name], you are scheduled for a virtual visit with a [facility] provider today. Just as with appointments in the office, your consent must be obtained to participate. Your consent will be active for this visit and any virtual visit you may have with one of our providers in the next 365 days. If you have a MyChart account, a copy of this consent can be sent to you electronically.  As this is a virtual visit, video technology does not allow for your provider to perform a traditional examination. This may limit your provider's ability to fully assess your condition. If your provider identifies any concerns that need to be evaluated in person or the need to arrange testing (such as labs, EKG, etc.), we will make arrangements to do so. Although advances in technology are sophisticated, we cannot ensure that it will always work on either your end or our end. If the connection with a video visit is poor, the visit may have to be switched to a telephone visit. With either a video or telephone visit, we are not always able to ensure that we have a secure connection.  By engaging in this virtual visit, you consent to the provision of healthcare and authorize for your insurance to be billed (if applicable) for the services provided during this visit. Depending on your insurance coverage, you may receive a charge related to this service.  I need to obtain your verbal consent now. Are you willing to proceed with your visit today? [name] has provided verbal consent on 12/06/2023 for a virtual visit (video or telephone). [name], [place]  Date: 12/06/2023 5:59 PM   Virtual Visit via Video Note   I, [name], connected with  [name]  ([id number], March 27, 1961) on 12/06/23 at  6:00 PM EST by a video-enabled telemedicine application and verified that I am speaking with the correct person using two identifiers.  Location: Patient:  Virtual Visit Location Patient: Home Provider: Virtual Visit Location Provider: Home Office   I discussed the limitations of evaluation and management by telemedicine and the availability of in person appointments. The patient expressed understanding and agreed to proceed.    History of Present Illness: [name] is a 63 y.o. who identifies as a female who was assigned female at birth, and is being seen today for abrupt onset of fever, chest congestion and cough over the past 24 hours along with chills, aches. Took home COVID test which was negative. Has had flu exposure in the past week.   HPI: HPI  Problems:  Patient Active Problem List   Diagnosis Date Noted   Inflammation of left external ear 01/18/2022   Abdominal pain, epigastric 05/28/2019   Bloating 05/28/2019   Suprapubic abdominal pain 05/28/2019   Gallbladder polyp 05/28/2019   Family history of abdominal aortic aneurysm (AAA) 05/17/2017   Elevated C-reactive protein (CRP) 11/01/2016   Dilated aortic root (HCC) 11/01/2016   Pruritic rash 07/06/2016   Electrocution 08/26/2015   Left-sided low back pain without sciatica 08/26/2015   Post-nasal drip 08/26/2015   De Quervain's tenosynovitis, right 04/10/2015   Pain in joint, shoulder region 02/22/2014   Aortic insufficiency 01/29/2014   Atrial fibrillation (HCC) 01/09/2014   Chest pain 01/09/2014   OTHER AND UNSPECIFIED OVARIAN CYST 09/08/2010   GERD 08/10/2010   Anxiety state 02/02/2010    Allergies:  Allergies  Allergen Reactions   Azithromycin     Patient told not  to take this med due to A-fib.   Sausage [Pickled Meat] Other (See Comments)    Causes migraines   Sulfamethoxazole Other (See Comments)    flushing   Medications:  Current Outpatient Medications:    benzonatate (TESSALON) 100 MG capsule, Take 1 capsule (100 mg total) by mouth 3 (three) times daily as needed for cough., Disp: 30 capsule, Rfl: 0   oseltamivir (TAMIFLU) 75 MG capsule, Take 1  capsule (75 mg total) by mouth 2 (two) times daily for 5 days., Disp: 10 capsule, Rfl: 0   Ascorbic Acid (VITAMIN C) 1000 MG tablet, Take 1,000 mg by mouth daily., Disp: , Rfl:    aspirin 81 MG chewable tablet, Chew 81 mg by mouth daily., Disp: , Rfl:    calcium carbonate (TUMS EX) 750 MG chewable tablet, Chew 1 tablet by mouth daily., Disp: , Rfl:    Cholecalciferol (D3-1000 PO), Take 1 tablet by mouth daily., Disp: , Rfl:    fluticasone (FLONASE) 50 MCG/ACT nasal spray, Place 2 sprays into both nostrils daily., Disp: 16 g, Rfl: 1   loratadine (CLARITIN) 10 MG tablet, Take 1 tablet (10 mg total) by mouth daily. (Patient taking differently: Take 10 mg by mouth daily as needed.), Disp: 90 tablet, Rfl: 1   MAGNESIUM GLYCINATE PO, Take 700 mg by mouth in the morning and at bedtime., Disp: , Rfl:    metoprolol succinate (TOPROL-XL) 25 MG 24 hr tablet, Take 0.5 tablets (12.5 mg total) by mouth daily., Disp: 45 tablet, Rfl: 3   triamcinolone cream (KENALOG) 0.1 %, Apply 1 Application topically 2 (two) times daily. (Patient taking differently: Apply 1 Application topically 2 (two) times daily as needed.), Disp: 60 g, Rfl: 0  Observations/Objective: Patient is well-developed, well-nourished in no acute distress.  Resting comfortably at home.  Head is normocephalic, atraumatic.  No labored breathing. Speech is clear and coherent with logical content.  Patient is alert and oriented at baseline.   Assessment and Plan: 1. Flu-like symptoms (Primary) - benzonatate (TESSALON) 100 MG capsule; Take 1 capsule (100 mg total) by mouth 3 (three) times daily as needed for cough.  Dispense: 30 capsule; Refill: 0 - oseltamivir (TAMIFLU) 75 MG capsule; Take 1 capsule (75 mg total) by mouth 2 (two) times daily for 5 days.  Dispense: 10 capsule; Refill: 0  Negative COVID. Classic influenza symptoms. Known exposure. Supportive measures, OTC medications and Vitamin recommendations reviewed. Will start Tamiflu per  orders. Tessalon per orders. Quarantine reviewed with patient.   Follow Up Instructions: I discussed the assessment and treatment plan with the patient. The patient was provided an opportunity to ask questions and all were answered. The patient agreed with the plan and demonstrated an understanding of the instructions.  A copy of instructions were sent to the patient via MyChart unless otherwise noted below.   The patient was advised to call back or seek an in-person evaluation if the symptoms worsen or if the condition fails to improve as anticipated.    Amber Climes, PA-C

## 2023-12-08 ENCOUNTER — Telehealth: Payer: Medicaid Other | Admitting: Physician Assistant

## 2023-12-08 DIAGNOSIS — R6889 Other general symptoms and signs: Secondary | ICD-10-CM

## 2023-12-08 NOTE — Progress Notes (Signed)
Virtual Visit Consent   [name], you are scheduled for a virtual visit with a [facility] provider today. Just as with appointments in the office, your consent must be obtained to participate. Your consent will be active for this visit and any virtual visit you may have with one of our providers in the next 365 days. If you have a MyChart account, a copy of this consent can be sent to you electronically.  As this is a virtual visit, video technology does not allow for your provider to perform a traditional examination. This may limit your provider's ability to fully assess your condition. If your provider identifies any concerns that need to be evaluated in person or the need to arrange testing (such as labs, EKG, etc.), we will make arrangements to do so. Although advances in technology are sophisticated, we cannot ensure that it will always work on either your end or our end. If the connection with a video visit is poor, the visit may have to be switched to a telephone visit. With either a video or telephone visit, we are not always able to ensure that we have a secure connection.  By engaging in this virtual visit, you consent to the provision of healthcare and authorize for your insurance to be billed (if applicable) for the services provided during this visit. Depending on your insurance coverage, you may receive a charge related to this service.  I need to obtain your verbal consent now. Are you willing to proceed with your visit today? [name] has provided verbal consent on 12/08/2023 for a virtual visit (video or telephone). Laure Kidney, [place]  Date: 12/08/2023 9:58 PM   Virtual Visit via Video Note   I, Laure Kidney, connected with  [name]  ([id number], [birth year]) on 12/08/23 at  9:45 PM EST by a video-enabled telemedicine application and verified that I am speaking with the correct person using two identifiers.  Location: Patient: Virtual Visit  Location Patient: Home Provider: Virtual Visit Location Provider: Home Office   I discussed the limitations of evaluation and management by telemedicine and the availability of in person appointments. The patient expressed understanding and agreed to proceed.    History of Present Illness: [name] is a 63 y.o. who identifies as a female who was assigned female at birth, and is being seen today for cough due to flu. Marland Kitchen  HPI: Influenza This is a new problem. The current episode started in the past 7 days. The problem occurs constantly. The problem has been gradually improving. Associated symptoms include coughing, fatigue and a fever. She has tried acetaminophen and lying down for the symptoms. The treatment provided mild relief.    Problems:  Patient Active Problem List   Diagnosis Date Noted   Inflammation of left external ear 01/18/2022   Abdominal pain, epigastric 05/28/2019   Bloating 05/28/2019   Suprapubic abdominal pain 05/28/2019   Gallbladder polyp 05/28/2019   Family history of abdominal aortic aneurysm (AAA) 05/17/2017   Elevated C-reactive protein (CRP) 11/01/2016   Dilated aortic root (HCC) 11/01/2016   Pruritic rash 07/06/2016   Electrocution 08/26/2015   Left-sided low back pain without sciatica 08/26/2015   Post-nasal drip 08/26/2015   De Quervain's tenosynovitis, right 04/10/2015   Pain in joint, shoulder region 02/22/2014   Aortic insufficiency 01/29/2014   Atrial fibrillation (HCC) 01/09/2014   Chest pain 01/09/2014   OTHER AND UNSPECIFIED OVARIAN CYST 09/08/2010   GERD 08/10/2010   Anxiety state 02/02/2010  Allergies:  Allergies  Allergen Reactions   Azithromycin     Patient told not to take this med due to A-fib.   Sausage [Pickled Meat] Other (See Comments)    Causes migraines   Sulfamethoxazole Other (See Comments)    flushing   Medications:  Current Outpatient Medications:    Ascorbic Acid (VITAMIN C) 1000 MG tablet, Take 1,000 mg by  mouth daily., Disp: , Rfl:    aspirin 81 MG chewable tablet, Chew 81 mg by mouth daily., Disp: , Rfl:    benzonatate (TESSALON) 100 MG capsule, Take 1 capsule (100 mg total) by mouth 3 (three) times daily as needed for cough., Disp: 30 capsule, Rfl: 0   calcium carbonate (TUMS EX) 750 MG chewable tablet, Chew 1 tablet by mouth daily., Disp: , Rfl:    Cholecalciferol (D3-1000 PO), Take 1 tablet by mouth daily., Disp: , Rfl:    fluticasone (FLONASE) 50 MCG/ACT nasal spray, Place 2 sprays into both nostrils daily., Disp: 16 g, Rfl: 1   loratadine (CLARITIN) 10 MG tablet, Take 1 tablet (10 mg total) by mouth daily. (Patient taking differently: Take 10 mg by mouth daily as needed.), Disp: 90 tablet, Rfl: 1   MAGNESIUM GLYCINATE PO, Take 700 mg by mouth in the morning and at bedtime., Disp: , Rfl:    metoprolol succinate (TOPROL-XL) 25 MG 24 hr tablet, Take 0.5 tablets (12.5 mg total) by mouth daily., Disp: 45 tablet, Rfl: 3   oseltamivir (TAMIFLU) 75 MG capsule, Take 1 capsule (75 mg total) by mouth 2 (two) times daily for 5 days., Disp: 10 capsule, Rfl: 0   triamcinolone cream (KENALOG) 0.1 %, Apply 1 Application topically 2 (two) times daily. (Patient taking differently: Apply 1 Application topically 2 (two) times daily as needed.), Disp: 60 g, Rfl: 0  Observations/Objective: Patient is well-developed, well-nourished in no acute distress.  Resting comfortably  at home.  Head is normocephalic, atraumatic.  No labored breathing.  Speech is clear and coherent with logical content.  Patient is alert and oriented at baseline.    Assessment and Plan: 1. Flu-like symptoms (Primary)  Patient presenting for re-evaluation of flu like symptoms, advised that due to her previously being seen via telehealth, I recommended an in person assessment including possible chest xray. Advised to report to ER immediately if she has shortness of breath, hemoptysis, wheezing or worsening symptoms. She agreed to this  plan. No charge for this visit.   Follow Up Instructions: I discussed the assessment and treatment plan with the patient. The patient was provided an opportunity to ask questions and all were answered. The patient agreed with the plan and demonstrated an understanding of the instructions.  A copy of instructions were sent to the patient via MyChart unless otherwise noted below.     The patient was advised to call back or seek an in-person evaluation if the symptoms worsen or if the condition fails to improve as anticipated.    Laure Kidney, PA-C

## 2023-12-08 NOTE — Patient Instructions (Signed)
  Pixie Casino, thank you for joining Laure Kidney, PA-C for today's virtual visit.  While this provider is not your primary care provider (PCP), if your PCP is located in our provider database this encounter information will be shared with them immediately following your visit.   A [facility] MyChart account gives you access to today's visit and all your visits, tests, and labs performed at [facility] " click here if you don't have a [facility] MyChart account or go to [email or web address]  Consent: (Patient) Pixie Casino provided verbal consent for this virtual visit at the beginning of the encounter.  Current Medications:  Current Outpatient Medications:    Ascorbic Acid (VITAMIN C) 1000 MG tablet, Take 1,000 mg by mouth daily., Disp: , Rfl:    aspirin 81 MG chewable tablet, Chew 81 mg by mouth daily., Disp: , Rfl:    benzonatate (TESSALON) 100 MG capsule, Take 1 capsule (100 mg total) by mouth 3 (three) times daily as needed for cough., Disp: 30 capsule, Rfl: 0   calcium carbonate (TUMS EX) 750 MG chewable tablet, Chew 1 tablet by mouth daily., Disp: , Rfl:    Cholecalciferol (D3-1000 PO), Take 1 tablet by mouth daily., Disp: , Rfl:    fluticasone (FLONASE) 50 MCG/ACT nasal spray, Place 2 sprays into both nostrils daily., Disp: 16 g, Rfl: 1   loratadine (CLARITIN) 10 MG tablet, Take 1 tablet (10 mg total) by mouth daily. (Patient taking differently: Take 10 mg by mouth daily as needed.), Disp: 90 tablet, Rfl: 1   MAGNESIUM GLYCINATE PO, Take 700 mg by mouth in the morning and at bedtime., Disp: , Rfl:    metoprolol succinate (TOPROL-XL) 25 MG 24 hr tablet, Take 0.5 tablets (12.5 mg total) by mouth daily., Disp: 45 tablet, Rfl: 3   oseltamivir (TAMIFLU) 75 MG capsule, Take 1 capsule (75 mg total) by mouth 2 (two) times daily for 5 days., Disp: 10 capsule, Rfl: 0   triamcinolone cream (KENALOG) 0.1 %, Apply 1 Application topically 2 (two) times daily.  (Patient taking differently: Apply 1 Application topically 2 (two) times daily as needed.), Disp: 60 g, Rfl: 0   Medications ordered in this encounter:  No orders of the defined types were placed in this encounter.    *If you need refills on other medications prior to your next appointment, please contact your pharmacy*  Follow-Up: Call back or seek an in-person evaluation if the symptoms worsen or if the condition fails to improve as anticipated.  [facility] [phone]  Other Instructions Report to nearest ER or Urgent care for evaluation.    If you have been instructed to have an in-person evaluation today at a local Urgent Care facility, please use the link below. It will take you to a list of all of our available [facility], including address, phone number and hours of operation. Please do not delay care.  [facility]  If you or a family member do not have a primary care provider, use the link below to schedule a visit and establish care. When you choose a [facility] primary care physician or advanced practice provider, you gain a long-term partner in health. Find a Primary Care Provider  Learn more about [facility] in-office and virtual care options: [facility] - Get Care Now

## 2023-12-10 ENCOUNTER — Encounter (HOSPITAL_BASED_OUTPATIENT_CLINIC_OR_DEPARTMENT_OTHER): Payer: Self-pay | Admitting: Emergency Medicine

## 2023-12-10 ENCOUNTER — Other Ambulatory Visit: Payer: Self-pay

## 2023-12-10 ENCOUNTER — Emergency Department (HOSPITAL_BASED_OUTPATIENT_CLINIC_OR_DEPARTMENT_OTHER): Payer: Medicaid Other | Admitting: Radiology

## 2023-12-10 ENCOUNTER — Other Ambulatory Visit (HOSPITAL_BASED_OUTPATIENT_CLINIC_OR_DEPARTMENT_OTHER): Payer: Self-pay

## 2023-12-10 ENCOUNTER — Emergency Department (HOSPITAL_BASED_OUTPATIENT_CLINIC_OR_DEPARTMENT_OTHER)
Admission: EM | Admit: 2023-12-10 | Discharge: 2023-12-10 | Disposition: A | Discharge status: Home / Self Care | Payer: Medicaid Other | Attending: Emergency Medicine | Admitting: Emergency Medicine

## 2023-12-10 DIAGNOSIS — J069 Acute upper respiratory infection, unspecified: Secondary | ICD-10-CM | POA: Diagnosis not present

## 2023-12-10 DIAGNOSIS — R059 Cough, unspecified: Secondary | ICD-10-CM | POA: Diagnosis not present

## 2023-12-10 DIAGNOSIS — B9789 Other viral agents as the cause of diseases classified elsewhere: Secondary | ICD-10-CM | POA: Insufficient documentation

## 2023-12-10 DIAGNOSIS — J988 Other specified respiratory disorders: Secondary | ICD-10-CM | POA: Insufficient documentation

## 2023-12-10 DIAGNOSIS — E663 Overweight: Secondary | ICD-10-CM | POA: Diagnosis not present

## 2023-12-10 DIAGNOSIS — Z7982 Long term (current) use of aspirin: Secondary | ICD-10-CM | POA: Diagnosis not present

## 2023-12-10 DIAGNOSIS — J40 Bronchitis, not specified as acute or chronic: Secondary | ICD-10-CM | POA: Diagnosis not present

## 2023-12-10 MED ORDER — SALINE SPRAY 0.65 % NA SOLN
1.0000 | NASAL | 0 refills | Status: AC | PRN
  Filled 2023-12-10: qty 44, 7d supply, fill #0

## 2023-12-10 NOTE — ED Triage Notes (Signed)
Cough/congestion for 7 days, did video visit and treated for flu, on the last 2 days of tamiflu. Felt like started wheezing and wanted to be seen.

## 2023-12-10 NOTE — Discharge Instructions (Addendum)
You were seen in the emergency room for persistent cough and yellow/green drainage.  The x-rays are negative for pneumonia.  Your lung exam was also reassuring.  Your vital signs are stable.  Consider applying nasal spray to your nose to keep your sinuses open.  Continue to take Mucinex as needed along with Tylenol or Motrin.  Ensure you are drinking plenty of water.  You should return to the ER if you start having worsening fevers.

## 2023-12-10 NOTE — ED Notes (Signed)
 Dc instructions reviewed with patient. Patient voiced understanding. Dc with belongings.

## 2023-12-10 NOTE — ED Provider Notes (Signed)
[place] EMERGENCY DEPARTMENT AT [facility] Provider Note   CSN: [id number] Arrival date & time: 12/10/23  0815     History  Chief Complaint  Patient presents with   Wheezing    [name] is a 63 y.o. female.  HPI     63 year old female comes into the emergency room with chief complaint of cough with yellow-green phlegm.  She also has sinusitis with similar drainage.  Patient states that on Sunday she cleaned up a storage space.  On Monday she started having URI-like symptoms.  Subsequently she followed up with telehealth doctor who put her on Tamiflu.  Patient was having fevers.  Yesterday she felt better and actually walked her dog.  However, she has noted that her cough is causing some chest discomfort and she is producing thick yellow-green phlegm.  She wants to make sure she does not have pneumonia.  In the past she has been given Augmentin for sinus infections and she is wondering if she needs the same.  She has been taking Mucinex.  Home Medications Prior to Admission medications   Medication Sig Start Date End Date Taking? Authorizing Provider  sodium chloride (OCEAN) 0.65 % SOLN nasal spray Place 1 spray into both nostrils as needed for congestion. 12/10/23  Yes Derwood Kaplan, MD  Ascorbic Acid (VITAMIN C) 1000 MG tablet Take 1,000 mg by mouth daily.    [provider]  aspirin 81 MG chewable tablet Chew 81 mg by mouth daily.    [provider]  benzonatate (TESSALON) 100 MG capsule Take 1 capsule (100 mg total) by mouth 3 (three) times daily as needed for cough. 12/06/23   Waldon Merl, PA-C  calcium carbonate (TUMS EX) 750 MG chewable tablet Chew 1 tablet by mouth daily.    [provider]  Cholecalciferol (D3-1000 PO) Take 1 tablet by mouth daily.    [provider]  fluticasone (FLONASE) 50 MCG/ACT nasal spray Place 2 sprays into both nostrils daily. 11/14/23   Claiborne Rigg, NP  loratadine (CLARITIN) 10  MG tablet Take 1 tablet (10 mg total) by mouth daily. Patient taking differently: Take 10 mg by mouth daily as needed. 04/20/23   Claiborne Rigg, NP  MAGNESIUM GLYCINATE PO Take 700 mg by mouth in the morning and at bedtime.    [provider]  metoprolol succinate (TOPROL-XL) 25 MG 24 hr tablet Take 0.5 tablets (12.5 mg total) by mouth daily. 04/20/23   Claiborne Rigg, NP  oseltamivir (TAMIFLU) 75 MG capsule Take 1 capsule (75 mg total) by mouth 2 (two) times daily for 5 days. 12/06/23 12/11/23  Waldon Merl, PA-C  triamcinolone cream (KENALOG) 0.1 % Apply 1 Application topically 2 (two) times daily. Patient taking differently: Apply 1 Application topically 2 (two) times daily as needed. 09/01/23   Hoy Register, MD      Allergies    Azithromycin, Sausage [pickled meat], and Sulfamethoxazole    Review of Systems   Review of Systems  All other systems reviewed and are negative.   Physical Exam Updated Vital Signs BP 118/81   Pulse 82   Temp 98.3 F (36.8 C) (Oral)   Resp 20   Wt 72.6 kg   LMP 11/25/2013   SpO2 98%   BMI 24.33 kg/m  Physical Exam Vitals and nursing note reviewed.  Constitutional:      Appearance: She is well-developed.  HENT:     Head: Atraumatic.     Nose: Congestion  present.  Cardiovascular:     Rate and Rhythm: Normal rate.  Pulmonary:     Effort: Pulmonary effort is normal.     Breath sounds: No wheezing, rhonchi or rales.  Musculoskeletal:     Cervical back: Normal range of motion and neck supple.  Lymphadenopathy:     Cervical: No cervical adenopathy.  Skin:    General: Skin is warm and dry.  Neurological:     Mental Status: She is alert and oriented to person, place, and time.     ED Results / Procedures / Treatments   Labs (all labs ordered are listed, but only abnormal results are displayed) Labs Reviewed - No data to display  EKG None  Radiology DG Chest 2 View Result Date: 12/10/2023 CLINICAL DATA:  Cough. EXAM:  CHEST - 2 VIEW COMPARISON:  05/22/2019. FINDINGS: Bilateral lung fields are clear. Bilateral costophrenic angles are clear. Normal cardio-mediastinal silhouette. No acute osseous abnormalities. The soft tissues are within normal limits. IMPRESSION: No active cardiopulmonary disease. Electronically Signed   By: Jules Schick M.D.   On: 12/10/2023 09:03    Procedures Procedures    Medications Ordered in ED Medications - No data to display  ED Course/ Medical Decision Making/ A&P                                 Medical Decision Making Amount and/or Complexity of Data Reviewed Radiology: ordered.  Risk OTC drugs.   63 year old patient with history of A-fib comes with a chief complaint of cough, chest discomfort, yellow/green phlegm.  Differential diagnosis for her includes: Viral syndrome Influenza Pharyngitis Sinusitis Mononucleosis Superimposed bacterial pneumonia  It appears clinically that patient has actually improved.  She has 0 SIRS criteria in the ER and she is not toxic appearing.  Plan is to get an x-ray to make sure there is no evidence of superimposed pneumonia.  Patient has history of A-fib, she wants to avoid inhalers or prednisone.  She states that at nighttime, she has had some audible wheezing.  My suspicion is that most likely the audible wheezing is because of her congestion.  Her lungs are completely clear.  If the x-rays are reassuring, then we will put her on nasal spray with saline. Discussed with her the futility of antibiotics, especially since she is getting better at this time and not getting worse.  Patient understanding.  Final Clinical Impression(s) / ED Diagnoses Final diagnoses:  Viral respiratory infection    Rx / DC Orders ED Discharge Orders          Ordered    sodium chloride (OCEAN) 0.65 % SOLN nasal spray  As needed        12/10/23 0954              Derwood Kaplan, MD 12/10/23 [phone]

## 2023-12-20 ENCOUNTER — Other Ambulatory Visit (HOSPITAL_BASED_OUTPATIENT_CLINIC_OR_DEPARTMENT_OTHER): Payer: Self-pay

## 2024-01-09 ENCOUNTER — Other Ambulatory Visit: Payer: Self-pay

## 2024-02-01 ENCOUNTER — Ambulatory Visit (HOSPITAL_COMMUNITY): Payer: Medicaid Other | Attending: Cardiovascular Disease

## 2024-02-01 DIAGNOSIS — I77810 Thoracic aortic ectasia: Secondary | ICD-10-CM | POA: Insufficient documentation

## 2024-02-01 DIAGNOSIS — I351 Nonrheumatic aortic (valve) insufficiency: Secondary | ICD-10-CM | POA: Insufficient documentation

## 2024-02-01 LAB — ECHOCARDIOGRAM COMPLETE
Area-P 1/2: 2.8 cm2
P 1/2 time: 466 ms
S' Lateral: 2.6 cm

## 2024-02-06 ENCOUNTER — Encounter: Payer: Self-pay | Admitting: Cardiovascular Disease

## 2024-02-12 ENCOUNTER — Encounter: Payer: Self-pay | Admitting: Internal Medicine

## 2024-03-04 DIAGNOSIS — H1031 Unspecified acute conjunctivitis, right eye: Secondary | ICD-10-CM | POA: Diagnosis not present

## 2024-03-04 DIAGNOSIS — H00021 Hordeolum internum right upper eyelid: Secondary | ICD-10-CM | POA: Diagnosis not present

## 2024-03-21 ENCOUNTER — Other Ambulatory Visit: Payer: Self-pay | Admitting: Nurse Practitioner

## 2024-03-21 DIAGNOSIS — Z1231 Encounter for screening mammogram for malignant neoplasm of breast: Secondary | ICD-10-CM

## 2024-03-27 ENCOUNTER — Other Ambulatory Visit: Payer: Self-pay

## 2024-03-27 ENCOUNTER — Encounter: Payer: Self-pay | Admitting: Dermatology

## 2024-03-27 ENCOUNTER — Other Ambulatory Visit (HOSPITAL_BASED_OUTPATIENT_CLINIC_OR_DEPARTMENT_OTHER): Payer: Self-pay

## 2024-03-27 ENCOUNTER — Ambulatory Visit: Payer: Medicaid Other | Admitting: Dermatology

## 2024-03-27 VITALS — BP 97/68

## 2024-03-27 DIAGNOSIS — L814 Other melanin hyperpigmentation: Secondary | ICD-10-CM

## 2024-03-27 DIAGNOSIS — D1801 Hemangioma of skin and subcutaneous tissue: Secondary | ICD-10-CM | POA: Diagnosis not present

## 2024-03-27 DIAGNOSIS — L110 Acquired keratosis follicularis: Secondary | ICD-10-CM | POA: Diagnosis not present

## 2024-03-27 DIAGNOSIS — D229 Melanocytic nevi, unspecified: Secondary | ICD-10-CM

## 2024-03-27 DIAGNOSIS — W908XXA Exposure to other nonionizing radiation, initial encounter: Secondary | ICD-10-CM | POA: Diagnosis not present

## 2024-03-27 DIAGNOSIS — L821 Other seborrheic keratosis: Secondary | ICD-10-CM | POA: Diagnosis not present

## 2024-03-27 DIAGNOSIS — L578 Other skin changes due to chronic exposure to nonionizing radiation: Secondary | ICD-10-CM

## 2024-03-27 DIAGNOSIS — Z1283 Encounter for screening for malignant neoplasm of skin: Secondary | ICD-10-CM | POA: Diagnosis not present

## 2024-03-27 DIAGNOSIS — Z86007 Personal history of in-situ neoplasm of skin: Secondary | ICD-10-CM | POA: Diagnosis not present

## 2024-03-27 DIAGNOSIS — Z808 Family history of malignant neoplasm of other organs or systems: Secondary | ICD-10-CM

## 2024-03-27 MED ORDER — CLOBETASOL PROPIONATE 0.05 % EX CREA
1.0000 | TOPICAL_CREAM | Freq: Two times a day (BID) | CUTANEOUS | 0 refills | Status: AC
  Filled 2024-03-27: qty 30, 14d supply, fill #0

## 2024-03-27 NOTE — Progress Notes (Unsigned)
   New Patient Visit   Subjective  [name] is a 63 y.o. female who presents for the following:  Total Body Skin Exam (TBSE)  Patient present today for new patient visit for TBSE.The patient reports she has spots, moles and lesions to be evaluated, some may be new or changing and the patient may have concern these could be cancer. Patient has previously been treated by a dermatologist Wynne Hedge at [facility]. 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 Patient reports family history of skin cancers (mother & maternal grandfather - unsure of type). Patient reports throughout her lifetime has had minimal sun exposure. Currently, patient reports if she has excessive sun exposure, she does not apply sunscreen and/or wears protective coverings.  The following portions of the chart were reviewed this encounter and updated as appropriate: medications, allergies, medical history  Review of Systems:  No other skin or systemic complaints except as noted in HPI or Assessment and Plan.  Objective  Well appearing patient in no apparent distress; mood and affect are within normal limits.  A full examination was performed including scalp, head, eyes, ears, nose, lips, neck, chest, axillae, abdomen, back, buttocks, bilateral upper extremities, bilateral lower extremities, hands, feet, fingers, toes, fingernails, and toenails. All findings within normal limits unless otherwise noted below.   Relevant exam findings are noted in the Assessment and Plan.    Assessment & Plan   LENTIGINES, SEBORRHEIC KERATOSES, HEMANGIOMAS - Benign normal skin lesions - Benign-appearing - Call for any changes  BENIGN MELANOCYTIC NEVI - Tan-brown and/or pink-flesh-colored symmetric macules and papules - Benign appearing on exam today - Observation - Call clinic for new or changing moles - Recommend daily use of broad spectrum spf 30+ sunscreen to sun-exposed  areas.   MILD ACTINIC DAMAGE - Chronic condition, secondary to cumulative UV/sun exposure - diffuse scaly erythematous macules with underlying dyspigmentation - Recommend daily broad spectrum sunscreen SPF 30+ to sun-exposed areas, reapply every 2 hours as needed.  - Staying in the shade or wearing long sleeves, sun glasses (UVA+UVB protection) and wide brim hats (4-inch brim around the entire circumference of the hat) are also recommended for sun protection.  - Call for new or changing lesions.  SKIN CANCER SCREENING PERFORMED TODAY  INVERTED FOLLICULAR KERATOSIS Mid Back Destruction of lesion - Mid Back Complexity: simple   Destruction method: cryotherapy   Informed consent: discussed and consent obtained   Timeout:  patient name, date of birth, surgical site, and procedure verified Lesion destroyed using liquid nitrogen: Yes   Region frozen until ice ball extended beyond lesion: Yes   Outcome: patient tolerated procedure well with no complications   Post-procedure details: wound care instructions given    Return in about 1 year (around 03/27/2025) for TBSE.   Documentation: I have reviewed the above documentation for accuracy and completeness, and I agree with the above.  I, Shirron Louanne Roussel, CMA, am acting as scribe for [employer], DO.   Louana Roup, DO

## 2024-03-27 NOTE — Patient Instructions (Addendum)
 Date: Tue Mar 27 2024  Dear [name],  Thank you for visiting today. Here is a summary of the key instructions:  - Medications:   - Take antihistamine daily for environmental allergies   - Use clobetasol for skin flares:     - Apply twice a day for up to 2 weeks     - Take a break after 2 weeks to avoid skin thinning   - Use triamcinolone  for dermatitis as needed  - Skin Care:   - Apply Eucerin Radiant Tone cream to age spot on hand   - Use Eucerin Radiant Tone serum for brown spots  - Sun Protection:   - Use SPF30 or higher sunscreen   - Reapply sunscreen every 3-4 hours   - Wear UPF protective clothing   - Use mineral sunscreens with zinc oxide or titanium dioxide when swimming   - Apply sunscreen to chest to prevent sun damage  - Follow-up:   - Schedule annual check-ups due to past skin cancer   - Return in one year or sooner if needed  We look forward to seeing you at your next visit. If you have any questions or concerns before then, please do not hesitate to contact our office.  Warm regards,  Dr. [facility]ana Roup [facility]          Cryotherapy Aftercare  Wash gently with soap and water everyday.   Apply Vaseline and Band-Aid daily until healed.    Important Information  Due to recent changes in healthcare laws, you may see results of your pathology and/or laboratory studies on MyChart before the doctors have had a chance to review them. We understand that in some cases there may be results that are confusing or concerning to you. Please understand that not all results are received at the same time and often the doctors may need to interpret multiple results in order to provide you with the best plan of care or course of treatment. Therefore, we ask that you please give us  2 business days to thoroughly review all your results before contacting the office for clarification. Should we see a critical lab result, you will be contacted sooner.   If You  Need Anything After Your Visit  If you have any questions or concerns for your doctor, please call our main line at [phone] If no one answers, please leave a voicemail as directed and we will return your call as soon as possible. Messages left after 4 pm will be answered the following business day.   You may also send us  a message via MyChart. We typically respond to MyChart messages within 1-2 business days.  For prescription refills, please ask your pharmacy to contact our office. Our fax number is [phone].  If you have an urgent issue when the clinic is closed that cannot wait until the next business day, you can page your doctor at the number below.    Please note that while we do our best to be available for urgent issues outside of office hours, we are not available 24/7.   If you have an urgent issue and are unable to reach us , you may choose to seek medical care at your doctor's office, retail clinic, urgent care center, or emergency room.  If you have a medical emergency, please immediately call 911 or go to the emergency department. In the event of inclement weather, please call our main line at [phone] for an update on the status of any delays or  closures.  Dermatology Medication Tips: Please keep the boxes that topical medications come in in order to help keep track of the instructions about where and how to use these. Pharmacies typically print the medication instructions only on the boxes and not directly on the medication tubes.   If your medication is too expensive, please contact our office at [phone] or send us  a message through MyChart.   We are unable to tell what your co-pay for medications will be in advance as this is different depending on your insurance coverage. However, we may be able to find a substitute medication at lower cost or fill out paperwork to get insurance to cover a needed medication.   If a prior authorization is required to get  your medication covered by your insurance company, please allow us  1-2 business days to complete this process.  Drug prices often vary depending on where the prescription is filled and some pharmacies may offer cheaper prices.  The website www.goodrx.com contains coupons for medications through different pharmacies. The prices here do not account for what the cost may be with help from insurance (it may be cheaper with your insurance), but the website can give you the price if you did not use any insurance.  - You can print the associated coupon and take it with your prescription to the pharmacy.  - You may also stop by our office during regular business hours and pick up a GoodRx coupon card.  - If you need your prescription sent electronically to a different pharmacy, notify our office through [facility] or by phone at [phone]

## 2024-04-02 ENCOUNTER — Encounter: Payer: Self-pay | Admitting: Physician Assistant

## 2024-04-03 ENCOUNTER — Ambulatory Visit

## 2024-04-05 ENCOUNTER — Ambulatory Visit
Admission: RE | Admit: 2024-04-05 | Discharge: 2024-04-05 | Disposition: A | Discharge status: Home / Self Care | Source: Ambulatory Visit | Attending: Nurse Practitioner | Admitting: Nurse Practitioner

## 2024-04-05 DIAGNOSIS — Z1231 Encounter for screening mammogram for malignant neoplasm of breast: Secondary | ICD-10-CM

## 2024-04-10 ENCOUNTER — Ambulatory Visit: Payer: Self-pay | Admitting: Internal Medicine

## 2024-05-16 ENCOUNTER — Other Ambulatory Visit (HOSPITAL_COMMUNITY): Payer: Self-pay

## 2024-05-18 DIAGNOSIS — M62838 Other muscle spasm: Secondary | ICD-10-CM | POA: Diagnosis not present

## 2024-05-18 DIAGNOSIS — K047 Periapical abscess without sinus: Secondary | ICD-10-CM | POA: Diagnosis not present

## 2024-05-24 DIAGNOSIS — K1121 Acute sialoadenitis: Secondary | ICD-10-CM | POA: Diagnosis not present

## 2024-05-28 DIAGNOSIS — H5213 Myopia, bilateral: Secondary | ICD-10-CM | POA: Diagnosis not present

## 2024-05-31 ENCOUNTER — Other Ambulatory Visit: Payer: Self-pay | Admitting: Nurse Practitioner

## 2024-05-31 ENCOUNTER — Telehealth: Payer: Self-pay

## 2024-05-31 DIAGNOSIS — M8580 Other specified disorders of bone density and structure, unspecified site: Secondary | ICD-10-CM

## 2024-05-31 NOTE — Telephone Encounter (Signed)
 Copied from CRM [id number]. Topic: Appointments - Scheduling Inquiry for Clinic >> May 31, 2024 11:00 AM [name] wrote: Reason for CRM: Patient calling to ask if the office could schedule her a bone density scan at [facility] location  ? Patient also would like to schedule a follow up to get an Ultrasound of the gallbladder, or does she need to contact the gastro doctor ultrasound scheduling?  Pt num [phone] (M)

## 2024-05-31 NOTE — Telephone Encounter (Signed)
 She can stop by for labs. If her markers for gallbladder or liver or elevated we can order a scan. If not we can not order

## 2024-06-01 NOTE — Telephone Encounter (Signed)
 Patient identified by name and date of birth.  Patient stated that she would reach out the the stomach doctor and see if the order can be placed without labs. Patient will inform us  of decision  made by doctor.

## 2024-06-18 ENCOUNTER — Telehealth: Payer: Self-pay | Admitting: Gastroenterology

## 2024-06-18 DIAGNOSIS — H15101 Unspecified episcleritis, right eye: Secondary | ICD-10-CM | POA: Diagnosis not present

## 2024-06-18 NOTE — Telephone Encounter (Signed)
 This pt will need an appt with any provider she chooses since Dr Teressa retired and she has not been seen since 2022

## 2024-06-18 NOTE — Telephone Encounter (Signed)
 Patient Is calling to inquire when she needs to have her next gallbladder scan. Patient is requesting a call back. Please advise.

## 2024-06-21 ENCOUNTER — Other Ambulatory Visit: Payer: Self-pay | Admitting: Nurse Practitioner

## 2024-06-21 ENCOUNTER — Other Ambulatory Visit: Payer: Self-pay | Admitting: Family Medicine

## 2024-06-21 DIAGNOSIS — Z8679 Personal history of other diseases of the circulatory system: Secondary | ICD-10-CM

## 2024-06-21 DIAGNOSIS — L243 Irritant contact dermatitis due to cosmetics: Secondary | ICD-10-CM

## 2024-06-22 ENCOUNTER — Other Ambulatory Visit: Payer: Self-pay

## 2024-06-26 ENCOUNTER — Other Ambulatory Visit: Payer: Self-pay

## 2024-06-26 MED ORDER — METOPROLOL SUCCINATE ER 25 MG PO TB24
12.5000 mg | ORAL_TABLET | Freq: Every day | ORAL | 0 refills | Status: DC
  Filled 2024-06-26: qty 45, 90d supply, fill #0

## 2024-06-27 ENCOUNTER — Other Ambulatory Visit: Payer: Self-pay

## 2024-06-28 ENCOUNTER — Other Ambulatory Visit: Payer: Self-pay

## 2024-06-28 DIAGNOSIS — H5203 Hypermetropia, bilateral: Secondary | ICD-10-CM | POA: Diagnosis not present

## 2024-07-16 DIAGNOSIS — J40 Bronchitis, not specified as acute or chronic: Secondary | ICD-10-CM | POA: Diagnosis not present

## 2024-07-16 DIAGNOSIS — R0982 Postnasal drip: Secondary | ICD-10-CM | POA: Diagnosis not present
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
## 2024-08-03 ENCOUNTER — Encounter: Payer: Self-pay | Admitting: Physician Assistant

## 2024-08-03 ENCOUNTER — Ambulatory Visit: Admitting: Physician Assistant

## 2024-08-03 VITALS — BP 94/60 | HR 76 | Ht 67.0 in | Wt 168.0 lb

## 2024-08-03 DIAGNOSIS — K824 Cholesterolosis of gallbladder: Secondary | ICD-10-CM | POA: Diagnosis not present

## 2024-08-03 NOTE — Patient Instructions (Addendum)
 You have been scheduled for an abdominal ultrasound at [facility] (1st floor of hospital) on Wednesday 08/08/24 at 9 am. Please arrive 30 minutes prior to your appointment for registration. Make certain not to have anything to eat or drink 6 hours prior to your appointment. Should you need to reschedule your appointment, please contact radiology at [phone]. This test typically takes about 30 minutes to perform.  _______________________________________________________  If your blood pressure at your visit was 140/90 or greater, please contact your primary care physician to follow up on this.  _______________________________________________________  If you are age 41 or older, your body mass index should be between 23-30. Your Body mass index is 26.31 kg/m. If this is out of the aforementioned range listed, please consider follow up with your Primary Care Provider.  If you are age 32 or younger, your body mass index should be between 19-25. Your Body mass index is 26.31 kg/m. If this is out of the aformentioned range listed, please consider follow up with your Primary Care Provider.   ________________________________________________________  The [facility] providers would like to encourage you to use MYCHART to communicate with providers for non-urgent requests or questions.  Due to long hold times on the telephone, sending your provider a message by [facility] may be a faster and more efficient way to get a response.  Please allow 48 business hours for a response.  Please remember that this is for non-urgent requests.  _______________________________________________________  [facility] is using a team-based approach to care.  Your team is made up of your doctor and two to three APPS. Our APPS (Nurse Practitioners and Physician Assistants) work with your physician to ensure care continuity for you. They are fully qualified to address your health concerns and develop a  treatment plan. They communicate directly with your gastroenterologist to care for you. Seeing the Advanced Practice Practitioners on your physician's team can help you by facilitating care more promptly, often allowing for earlier appointments, access to diagnostic testing, procedures, and other specialty referrals.

## 2024-08-08 ENCOUNTER — Ambulatory Visit (HOSPITAL_BASED_OUTPATIENT_CLINIC_OR_DEPARTMENT_OTHER)
Admission: RE | Admit: 2024-08-08 | Discharge: 2024-08-08 | Disposition: A | Discharge status: Home / Self Care | Source: Ambulatory Visit | Attending: Physician Assistant | Admitting: Physician Assistant

## 2024-08-08 DIAGNOSIS — K824 Cholesterolosis of gallbladder: Secondary | ICD-10-CM | POA: Diagnosis present

## 2024-08-09 ENCOUNTER — Ambulatory Visit: Payer: Self-pay | Admitting: Gastroenterology

## 2024-08-14 DIAGNOSIS — H43812 Vitreous degeneration, left eye: Secondary | ICD-10-CM | POA: Diagnosis not present

## 2024-09-04 ENCOUNTER — Telehealth: Payer: Self-pay | Admitting: Nurse Practitioner

## 2024-09-04 DIAGNOSIS — H43812 Vitreous degeneration, left eye: Secondary | ICD-10-CM | POA: Diagnosis not present

## 2024-09-04 DIAGNOSIS — H33302 Unspecified retinal break, left eye: Secondary | ICD-10-CM | POA: Diagnosis not present

## 2024-09-04 NOTE — Telephone Encounter (Signed)
 Copied from CRM [id number]. Topic: Referral - Question >> Sep 04, 2024  3:59 PM Avram MATSU wrote:  Reason for CRM: Hedy is is calling from MD Regan's office and stated patient needs a referral for vision, stated this is urgent because she has a tear in her eye.  [phone] [phone]

## 2024-09-05 ENCOUNTER — Other Ambulatory Visit: Payer: Self-pay

## 2024-09-05 NOTE — Telephone Encounter (Signed)
 Patient identified by name and date of birth.  Patient stated that she has an appointment for this issue 11/13 @ 8am.   I was unable to contact that office by phone for further information and to see if a referral is still needing to be placed. Will call again after lunch.

## 2024-09-06 ENCOUNTER — Encounter (INDEPENDENT_AMBULATORY_CARE_PROVIDER_SITE_OTHER): Admitting: Ophthalmology

## 2024-09-11 ENCOUNTER — Other Ambulatory Visit (HOSPITAL_BASED_OUTPATIENT_CLINIC_OR_DEPARTMENT_OTHER): Payer: Self-pay

## 2024-09-11 MED ORDER — FLUZONE 0.5 ML IM SUSY
0.5000 mL | PREFILLED_SYRINGE | Freq: Once | INTRAMUSCULAR | 0 refills | Status: AC
  Filled 2024-09-11: qty 0.5, 1d supply, fill #0

## 2024-09-21 ENCOUNTER — Other Ambulatory Visit: Payer: Self-pay | Admitting: Nurse Practitioner

## 2024-09-21 DIAGNOSIS — Z8679 Personal history of other diseases of the circulatory system: Secondary | ICD-10-CM

## 2024-09-25 ENCOUNTER — Other Ambulatory Visit: Payer: Self-pay | Admitting: Internal Medicine

## 2024-09-25 ENCOUNTER — Encounter: Payer: Self-pay | Admitting: Internal Medicine

## 2024-09-25 ENCOUNTER — Telehealth: Payer: Self-pay | Admitting: Dermatology

## 2024-09-25 ENCOUNTER — Encounter: Payer: Self-pay | Admitting: Dermatology

## 2024-09-25 ENCOUNTER — Other Ambulatory Visit: Payer: Self-pay

## 2024-09-25 ENCOUNTER — Telehealth: Payer: Self-pay | Admitting: Internal Medicine

## 2024-09-25 DIAGNOSIS — Z8679 Personal history of other diseases of the circulatory system: Secondary | ICD-10-CM

## 2024-09-25 NOTE — Telephone Encounter (Signed)
 Please advise if you would like to start managing pt's metoprolol .

## 2024-09-25 NOTE — Telephone Encounter (Signed)
 Pt c/o medication issue:  1. Name of Medication:   metoprolol  succinate (TOPROL -XL) 25 MG 24 hr tablet    2. How are you currently taking this medication (dosage and times per day)? Take 0.5 tablets (12.5 mg total) by mouth daily.   3. Are you having a reaction (difficulty breathing--STAT)? No 4. What is your medication issue? Patient is requesting for Dr. Mona to start managing her prescription. Patient stated she will be out in a few days and would like us  to send the prescription to [facility]. Please advise

## 2024-09-26 ENCOUNTER — Encounter: Admitting: Nurse Practitioner

## 2024-09-26 ENCOUNTER — Other Ambulatory Visit (HOSPITAL_BASED_OUTPATIENT_CLINIC_OR_DEPARTMENT_OTHER): Payer: Self-pay

## 2024-09-26 ENCOUNTER — Other Ambulatory Visit: Payer: Self-pay

## 2024-09-26 ENCOUNTER — Other Ambulatory Visit: Payer: Self-pay | Admitting: Internal Medicine

## 2024-09-26 DIAGNOSIS — Z8679 Personal history of other diseases of the circulatory system: Secondary | ICD-10-CM

## 2024-09-26 MED ORDER — METOPROLOL SUCCINATE ER 25 MG PO TB24
12.5000 mg | ORAL_TABLET | Freq: Every day | ORAL | 0 refills | Status: DC
  Filled 2024-09-26: qty 45, 90d supply, fill #0

## 2024-09-26 MED ORDER — METOPROLOL SUCCINATE ER 25 MG PO TB24
12.5000 mg | ORAL_TABLET | Freq: Every day | ORAL | 1 refills | Status: AC
  Filled 2024-09-26 – 2024-09-27 (×2): qty 45, 90d supply, fill #0

## 2024-09-26 MED ORDER — METOPROLOL SUCCINATE ER 25 MG PO TB24: 12.5000 mg | ORAL_TABLET | Freq: Every day | ORAL | 3 refills | Status: DC

## 2024-09-26 MED ORDER — METOPROLOL SUCCINATE ER 25 MG PO TB24
12.5000 mg | ORAL_TABLET | Freq: Every day | ORAL | 3 refills | Status: DC
  Filled 2024-09-26: qty 45, 90d supply, fill #0

## 2024-09-26 NOTE — Telephone Encounter (Signed)
 Amber Vinie BROCKS, MD to [facility] Triage (Selected Message)   09/26/24  9:47 AM That's fine - I sent the Rx to [facility] with refills  Rx sent F/U scheduled 12/2024

## 2024-09-26 NOTE — Telephone Encounter (Signed)
 Previous my chart message sent from patient was answered with a phone call. Patient stated that the Clobetasol , prescribed 6 months ago, seems too strong and is irritating to the skin. Patient was advised to stop use of Clobetasol  every day on the area if irritation is occurring and to take a break from the medication

## 2024-09-27 ENCOUNTER — Other Ambulatory Visit (HOSPITAL_BASED_OUTPATIENT_CLINIC_OR_DEPARTMENT_OTHER): Payer: Self-pay

## 2024-09-28 ENCOUNTER — Other Ambulatory Visit: Payer: Self-pay

## 2024-09-29 DIAGNOSIS — Z20822 Contact with and (suspected) exposure to covid-19: Secondary | ICD-10-CM | POA: Diagnosis not present

## 2024-09-29 DIAGNOSIS — J069 Acute upper respiratory infection, unspecified: Secondary | ICD-10-CM | POA: Diagnosis not present

## 2024-10-02 ENCOUNTER — Telehealth: Admitting: Physician Assistant

## 2024-10-02 ENCOUNTER — Ambulatory Visit: Admitting: Orthopaedic Surgery

## 2024-10-02 DIAGNOSIS — B9689 Other specified bacterial agents as the cause of diseases classified elsewhere: Secondary | ICD-10-CM | POA: Diagnosis not present

## 2024-10-02 DIAGNOSIS — J208 Acute bronchitis due to other specified organisms: Secondary | ICD-10-CM | POA: Diagnosis not present

## 2024-10-02 MED ORDER — BENZONATATE 100 MG PO CAPS: 100.0000 mg | ORAL_CAPSULE | Freq: Three times a day (TID) | ORAL | 0 refills | Status: AC | PRN

## 2024-10-02 MED ORDER — AMOXICILLIN-POT CLAVULANATE 875-125 MG PO TABS: 1.0000 | ORAL_TABLET | Freq: Two times a day (BID) | ORAL | 0 refills | Status: AC

## 2024-10-02 NOTE — Patient Instructions (Signed)
 [name], thank you for joining Amber Velma Lunger, PA-C for today's virtual visit.  While this provider is not your primary care provider (PCP), if your PCP is located in our provider database this encounter information will be shared with them immediately following your visit.   A [facility] MyChart account gives you access to today's visit and all your visits, tests, and labs performed at [facility]  click here if you don't have a  MyChart account or go to [email or web address]  Consent: (Patient) [name] provided verbal consent for this virtual visit at the beginning of the encounter.  Current Medications:  Current Outpatient Medications:    Ascorbic Acid (VITAMIN C) 1000 MG tablet, Take 1,000 mg by mouth daily., Disp: , Rfl:    aspirin  81 MG chewable tablet, Chew 81 mg by mouth daily., Disp: , Rfl:    calcium  carbonate (TUMS EX) 750 MG chewable tablet, Chew 1 tablet by mouth daily., Disp: , Rfl:    Cholecalciferol (D3-1000 PO), Take 1 tablet by mouth daily., Disp: , Rfl:    clobetasol  cream (TEMOVATE ) 0.05 %, Apply 1 Application topically 2 (two) times daily. After 2 weeks stop, Disp: 30 g, Rfl: 0   fluticasone  (FLONASE ) 50 MCG/ACT nasal spray, Place 2 sprays into both nostrils daily., Disp: 16 g, Rfl: 1   loratadine  (CLARITIN ) 10 MG tablet, Take 1 tablet (10 mg total) by mouth daily. (Patient taking differently: Take 10 mg by mouth daily as needed.), Disp: 90 tablet, Rfl: 1   MAGNESIUM GLYCINATE PO, Take 700 mg by mouth in the morning and at bedtime., Disp: , Rfl:    metoprolol  succinate (TOPROL -XL) 25 MG 24 hr tablet, Take 0.5 tablets (12.5 mg total) by mouth daily., Disp: 45 tablet, Rfl: 1   Polyethyl Glycol-Propyl Glycol (SYSTANE FREE OP), Apply 1 drop to eye daily., Disp: , Rfl:    sodium chloride  (OCEAN) 0.65 % SOLN nasal spray, Place 1 spray into both nostrils as needed for congestion., Disp: 44 mL, Rfl: 0   triamcinolone   cream (KENALOG ) 0.1 %, Apply 1 Application topically 2 (two) times daily. (Patient taking differently: Apply 1 Application topically 2 (two) times daily as needed.), Disp: 60 g, Rfl: 0   Vitamin D -Vitamin K (VITAMIN K2-VITAMIN D3 PO), Take 1 tablet by mouth daily., Disp: , Rfl:    Medications ordered in this encounter:  No orders of the defined types were placed in this encounter.    *If you need refills on other medications prior to your next appointment, please contact your pharmacy*  Follow-Up: Call back or seek an in-person evaluation if the symptoms worsen or if the condition fails to improve as anticipated.  [facility] [phone]  Other Instructions Take antibiotic (Augmentin ) as directed.  Increase fluids.  Get plenty of rest. Use Mucinex for congestion. Tessalon  as directed for cough. Take a daily probiotic (I recommend Align or Culturelle, but even Activia Yogurt may be beneficial).  A humidifier placed in the bedroom may offer some relief for a dry, scratchy throat of nasal irritation.  Read information below on acute bronchitis. If you note any non-resolving, new, or worsening symptoms despite treatment, please seek an in-person evaluation ASAP.    Acute Bronchitis Bronchitis is when the airways that extend from the windpipe into the lungs get red, puffy, and painful (inflamed). Bronchitis often causes thick spit (mucus) to develop. This leads to a cough. A cough is the most common symptom of bronchitis. In acute  bronchitis, the condition usually begins suddenly and goes away over time (usually in 2 weeks). Smoking, allergies, and asthma can make bronchitis worse. Repeated episodes of bronchitis may cause more lung problems.  HOME CARE Rest. Drink enough fluids to keep your pee (urine) clear or pale yellow (unless you need to limit fluids as told by your doctor). Only take over-the-counter or prescription medicines as told by your doctor. Avoid smoking and  secondhand smoke. These can make bronchitis worse. If you are a smoker, think about using nicotine gum or skin patches. Quitting smoking will help your lungs heal faster. Reduce the chance of getting bronchitis again by: Washing your hands often. Avoiding people with cold symptoms. Trying not to touch your hands to your mouth, nose, or eyes. Follow up with your doctor as told.  GET HELP IF: Your symptoms do not improve after 1 week of treatment. Symptoms include: Cough. Fever. Coughing up thick spit. Body aches. Chest congestion. Chills. Shortness of breath. Sore throat.  GET HELP RIGHT AWAY IF:  You have an increased fever. You have chills. You have severe shortness of breath. You have bloody thick spit (sputum). You throw up (vomit) often. You lose too much body fluid (dehydration). You have a severe headache. You faint.  MAKE SURE YOU:  Understand these instructions. Will watch your condition. Will get help right away if you are not doing well or get worse. Document Released: 03/29/2008 Document Revised: 06/13/2013 Document Reviewed: 04/03/2013 [facility] Patient Information 2015 [place], [place]. This information is not intended to replace advice given to you by your health care provider. Make sure you discuss any questions you have with your health care provider.    If you have been instructed to have an in-person evaluation today at a local Urgent Care facility, please use the link below. It will take you to a list of all of our available [facility], including address, phone number and hours of operation. Please do not delay care.  [facility]  If you or a family member do not have a primary care provider, use the link below to schedule a visit and establish care. When you choose a [facility] primary care physician or advanced practice provider, you gain a long-term partner in health. Find a Primary Care Provider  Learn more about [facility]'s in-office and virtual care options: [facility] - Get Care Now

## 2024-10-02 NOTE — Progress Notes (Signed)
 Virtual Visit Consent   [name], you are scheduled for a virtual visit with a [facility] provider today. Just as with appointments in the office, your consent must be obtained to participate. Your consent will be active for this visit and any virtual visit you may have with one of our providers in the next 365 days. If you have a MyChart account, a copy of this consent can be sent to you electronically.  As this is a virtual visit, video technology does not allow for your provider to perform a traditional examination. This may limit your provider's ability to fully assess your condition. If your provider identifies any concerns that need to be evaluated in person or the need to arrange testing (such as labs, EKG, etc.), we will make arrangements to do so. Although advances in technology are sophisticated, we cannot ensure that it will always work on either your end or our end. If the connection with a video visit is poor, the visit may have to be switched to a telephone visit. With either a video or telephone visit, we are not always able to ensure that we have a secure connection.  By engaging in this virtual visit, you consent to the provision of healthcare and authorize for your insurance to be billed (if applicable) for the services provided during this visit. Depending on your insurance coverage, you may receive a charge related to this service.  I need to obtain your verbal consent now. Are you willing to proceed with your visit today? [name] has provided verbal consent on 10/02/2024 for a virtual visit (video or telephone). [name], [place]  Date: 10/02/2024 2:13 PM   Virtual Visit via Video Note   I, [name], connected with  [name]  ([id number], 1961-05-04) on 10/02/24 at  2:15 PM EST by a video-enabled telemedicine application and verified that I am speaking with the correct person using two identifiers.  Location: Patient:  Virtual Visit Location Patient: Home Provider: Virtual Visit Location Provider: Home Office   I discussed the limitations of evaluation and management by telemedicine and the availability of in person appointments. The patient expressed understanding and agreed to proceed.    History of Present Illness: [name] is a 63 y.o. who identifies as a female who was assigned female at birth, and is being seen today for URI symptoms starting > 1 weeks ago. Notes history of c hronic post-nasal drip, but as of a little over a week ago with change in chronic symptoms. Noted increased congestion with sinus pressure and change in nasal discharge from clear to thick and yellow. Fever lasting just couple of days before resolving. Since day three from the change in symptoms noting chest  congestion and persistent cough. Cough was dry and then productive of clear phlegm. Mucous has not changed color.   Negative home COVID/Flu testing.   HPI: HPI  Problems:  Patient Active Problem List   Diagnosis Date Noted   Inflammation of left external ear 01/18/2022   Abdominal pain, epigastric 05/28/2019   Bloating 05/28/2019   Suprapubic abdominal pain 05/28/2019   Gallbladder polyp 05/28/2019   Family history of abdominal aortic aneurysm (AAA) 05/17/2017   Elevated C-reactive protein (CRP) 11/01/2016   Dilated aortic root 11/01/2016   Pruritic rash 07/06/2016   Electrocution 08/26/2015   Left-sided low back pain without sciatica 08/26/2015   Post-nasal drip 08/26/2015   De Quervain's tenosynovitis, right 04/10/2015   Pain in joint,  shoulder region 02/22/2014   Aortic insufficiency 01/29/2014   Atrial fibrillation (HCC) 01/09/2014   Chest pain 01/09/2014   OTHER AND UNSPECIFIED OVARIAN CYST 09/08/2010   GERD 08/10/2010   Anxiety state 02/02/2010    Allergies:  Allergies  Allergen Reactions   Azithromycin      Patient told not to take this med due to A-fib.   Sausage [Pickled Meat] Other (See  Comments)    Causes migraines   Sulfamethoxazole Other (See Comments)    flushing   Medications:  Current Outpatient Medications:    amoxicillin -clavulanate (AUGMENTIN ) 875-125 MG tablet, Take 1 tablet by mouth 2 (two) times daily., Disp: 14 tablet, Rfl: 0   benzonatate  (TESSALON ) 100 MG capsule, Take 1 capsule (100 mg total) by mouth 3 (three) times daily as needed for cough., Disp: 30 capsule, Rfl: 0   Ascorbic Acid (VITAMIN C) 1000 MG tablet, Take 1,000 mg by mouth daily., Disp: , Rfl:    aspirin  81 MG chewable tablet, Chew 81 mg by mouth daily., Disp: , Rfl:    calcium  carbonate (TUMS EX) 750 MG chewable tablet, Chew 1 tablet by mouth daily., Disp: , Rfl:    clobetasol  cream (TEMOVATE ) 0.05 %, Apply 1 Application topically 2 (two) times daily. After 2 weeks stop, Disp: 30 g, Rfl: 0   fluticasone  (FLONASE ) 50 MCG/ACT nasal spray, Place 2 sprays into both nostrils daily., Disp: 16 g, Rfl: 1   MAGNESIUM GLYCINATE PO, Take 700 mg by mouth in the morning and at bedtime., Disp: , Rfl:    metoprolol  succinate (TOPROL -XL) 25 MG 24 hr tablet, Take 0.5 tablets (12.5 mg total) by mouth daily., Disp: 45 tablet, Rfl: 1   sodium chloride  (OCEAN) 0.65 % SOLN nasal spray, Place 1 spray into both nostrils as needed for congestion., Disp: 44 mL, Rfl: 0   Vitamin D -Vitamin K (VITAMIN K2-VITAMIN D3 PO), Take 1 tablet by mouth daily., Disp: , Rfl:   Observations/Objective: Patient is well-developed, well-nourished in no acute distress.  Resting comfortably  at home.  Head is normocephalic, atraumatic.  No labored breathing.  Speech is clear and coherent with logical content.  Patient is alert and oriented at baseline.   Assessment and Plan: 1. Acute bacterial bronchitis (Primary) - benzonatate  (TESSALON ) 100 MG capsule; Take 1 capsule (100 mg total) by mouth 3 (three) times daily as needed for cough.  Dispense: 30 capsule; Refill: 0 - amoxicillin -clavulanate (AUGMENTIN ) 875-125 MG tablet; Take 1 tablet  by mouth 2 (two) times daily.  Dispense: 14 tablet; Refill: 0  Rx Augmentin .  Increase fluids.  Rest.  Saline nasal spray.  Probiotic.  Mucinex as directed.  Humidifier in bedroom. Tessalon  per orders. Continue albuterol ..  Call or return to clinic if symptoms are not improving.   Follow Up Instructions: I discussed the assessment and treatment plan with the patient. The patient was provided an opportunity to ask questions and all were answered. The patient agreed with the plan and demonstrated an understanding of the instructions.  A copy of instructions were sent to the patient via MyChart unless otherwise noted below.   The patient was advised to call back or seek an in-person evaluation if the symptoms worsen or if the condition fails to improve as anticipated.    Amber Velma Lunger, PA-C

## 2024-10-05 ENCOUNTER — Other Ambulatory Visit: Payer: Self-pay

## 2024-10-09 ENCOUNTER — Ambulatory Visit: Admitting: Orthopaedic Surgery

## 2024-10-09 DIAGNOSIS — M25572 Pain in left ankle and joints of left foot: Secondary | ICD-10-CM
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
## 2024-11-08 ENCOUNTER — Ambulatory Visit: Admitting: Orthopedic Surgery

## 2024-11-14 ENCOUNTER — Encounter: Payer: Self-pay | Admitting: Podiatry

## 2024-11-14 ENCOUNTER — Ambulatory Visit: Admitting: Podiatry

## 2024-11-14 ENCOUNTER — Ambulatory Visit

## 2024-11-14 DIAGNOSIS — M216X1 Other acquired deformities of right foot: Secondary | ICD-10-CM | POA: Diagnosis not present

## 2024-11-14 DIAGNOSIS — Q666 Other congenital valgus deformities of feet: Secondary | ICD-10-CM

## 2024-11-14 DIAGNOSIS — M216X2 Other acquired deformities of left foot: Secondary | ICD-10-CM

## 2024-11-22 NOTE — Progress Notes (Signed)
 Subjective:   Patient ID: [name], female   DOB: 64 y.o.   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  Patient does not smoke and likes to be active if possible with history of plantar fasciitis   Review of Systems  All other systems reviewed and are negative.       Objective:  Physical Exam Vitals and nursing note reviewed.  Constitutional:      Appearance: She is well-developed.  Pulmonary:     Effort: Pulmonary effort is normal.  Musculoskeletal:        General: Normal range of motion.  Skin:    General: Skin is warm.  Neurological:     Mental Status: She is alert.     Neurovascular status intact muscle strength found to be adequate range of motion within normal limits.  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  Due to the natural deformity patient is casted for functional orthotic devices and will be seen back when ready with all questions answered today  X-rays left do indicate moderate depression of the arch with collapse medial longitudinal arch occurring

## 2024-12-26 ENCOUNTER — Ambulatory Visit: Admitting: Internal Medicine

## 2025-01-24 ENCOUNTER — Other Ambulatory Visit (HOSPITAL_BASED_OUTPATIENT_CLINIC_OR_DEPARTMENT_OTHER)

## 2025-03-27 ENCOUNTER — Ambulatory Visit: Admitting: Dermatology
